# Patient Record
Sex: Female | Born: 1950 | Race: White | Hispanic: No | Marital: Married | Smoking: Never smoker
Health system: Southern US, Community
[De-identification: ages and names within clinical notes are randomized; demographics above are authoritative.]

## PROBLEM LIST (undated history)

## (undated) DIAGNOSIS — F419 Anxiety disorder, unspecified: Secondary | ICD-10-CM

## (undated) DIAGNOSIS — I10 Essential (primary) hypertension: Secondary | ICD-10-CM

## (undated) DIAGNOSIS — F03A0 Unspecified dementia, mild, without behavioral disturbance, psychotic disturbance, mood disturbance, and anxiety: Secondary | ICD-10-CM

## (undated) DIAGNOSIS — E785 Hyperlipidemia, unspecified: Secondary | ICD-10-CM

## (undated) DIAGNOSIS — L97529 Non-pressure chronic ulcer of other part of left foot with unspecified severity: Secondary | ICD-10-CM

## (undated) DIAGNOSIS — F0280 Dementia in other diseases classified elsewhere without behavioral disturbance: Secondary | ICD-10-CM

## (undated) DIAGNOSIS — L539 Erythematous condition, unspecified: Secondary | ICD-10-CM

## (undated) DIAGNOSIS — I998 Other disorder of circulatory system: Secondary | ICD-10-CM

## (undated) DIAGNOSIS — L97519 Non-pressure chronic ulcer of other part of right foot with unspecified severity: Secondary | ICD-10-CM

## (undated) DIAGNOSIS — F0390 Unspecified dementia without behavioral disturbance: Secondary | ICD-10-CM

## (undated) DIAGNOSIS — G20A1 Parkinson's disease without dyskinesia, without mention of fluctuations: Secondary | ICD-10-CM

## (undated) DIAGNOSIS — N201 Calculus of ureter: Secondary | ICD-10-CM

## (undated) DIAGNOSIS — IMO0001 Reserved for inherently not codable concepts without codable children: Secondary | ICD-10-CM

## (undated) DIAGNOSIS — L821 Other seborrheic keratosis: Secondary | ICD-10-CM

## (undated) DIAGNOSIS — Z8619 Personal history of other infectious and parasitic diseases: Secondary | ICD-10-CM

## (undated) DIAGNOSIS — G20 Parkinson's disease: Secondary | ICD-10-CM

## (undated) HISTORY — PX: BREAST EXCISIONAL BIOPSY: SUR124

## (undated) HISTORY — PX: OVARIAN CYST REMOVAL: SHX89

## (undated) HISTORY — PX: APPENDECTOMY: SHX54

## (undated) HISTORY — PX: COLONOSCOPY: SHX174

---

## 1999-06-30 ENCOUNTER — Encounter: Admission: RE | Admit: 1999-06-30 | Discharge: 1999-06-30 | Payer: Self-pay | Admitting: Family Medicine

## 1999-06-30 ENCOUNTER — Encounter: Payer: Self-pay | Admitting: Family Medicine

## 2000-11-02 ENCOUNTER — Encounter: Payer: Self-pay | Admitting: Family Medicine

## 2000-11-02 ENCOUNTER — Encounter: Admission: RE | Admit: 2000-11-02 | Discharge: 2000-11-02 | Payer: Self-pay | Admitting: Family Medicine

## 2000-11-28 ENCOUNTER — Encounter: Payer: Self-pay | Admitting: Family Medicine

## 2000-11-28 ENCOUNTER — Encounter: Admission: RE | Admit: 2000-11-28 | Discharge: 2000-11-28 | Payer: Self-pay | Admitting: Family Medicine

## 2001-03-09 ENCOUNTER — Ambulatory Visit (HOSPITAL_COMMUNITY): Admission: RE | Admit: 2001-03-09 | Discharge: 2001-03-09 | Payer: Self-pay | Admitting: Gastroenterology

## 2001-11-03 ENCOUNTER — Encounter: Admission: RE | Admit: 2001-11-03 | Discharge: 2001-11-03 | Payer: Self-pay | Admitting: Family Medicine

## 2001-11-03 ENCOUNTER — Encounter: Payer: Self-pay | Admitting: Family Medicine

## 2003-02-01 ENCOUNTER — Encounter: Admission: RE | Admit: 2003-02-01 | Discharge: 2003-02-01 | Payer: Self-pay | Admitting: Family Medicine

## 2003-02-01 ENCOUNTER — Encounter: Payer: Self-pay | Admitting: Family Medicine

## 2003-04-26 ENCOUNTER — Encounter: Admission: RE | Admit: 2003-04-26 | Discharge: 2003-04-26 | Payer: Self-pay | Admitting: Family Medicine

## 2003-04-26 ENCOUNTER — Encounter: Payer: Self-pay | Admitting: Family Medicine

## 2004-02-21 ENCOUNTER — Encounter: Admission: RE | Admit: 2004-02-21 | Discharge: 2004-02-21 | Payer: Self-pay | Admitting: Family Medicine

## 2004-04-06 ENCOUNTER — Encounter: Admission: RE | Admit: 2004-04-06 | Discharge: 2004-04-06 | Payer: Self-pay | Admitting: Family Medicine

## 2005-06-10 ENCOUNTER — Encounter: Admission: RE | Admit: 2005-06-10 | Discharge: 2005-06-10 | Payer: Self-pay | Admitting: Family Medicine

## 2006-07-14 ENCOUNTER — Encounter: Admission: RE | Admit: 2006-07-14 | Discharge: 2006-07-14 | Payer: Self-pay | Admitting: Family Medicine

## 2007-08-23 ENCOUNTER — Encounter: Admission: RE | Admit: 2007-08-23 | Discharge: 2007-08-23 | Payer: Self-pay | Admitting: Family Medicine

## 2008-08-23 ENCOUNTER — Encounter: Admission: RE | Admit: 2008-08-23 | Discharge: 2008-08-23 | Payer: Self-pay | Admitting: Family Medicine

## 2009-05-23 ENCOUNTER — Encounter: Admission: RE | Admit: 2009-05-23 | Discharge: 2009-05-23 | Payer: Self-pay | Admitting: Family Medicine

## 2009-08-25 ENCOUNTER — Encounter: Admission: RE | Admit: 2009-08-25 | Discharge: 2009-08-25 | Payer: Self-pay | Admitting: Family Medicine

## 2010-08-01 ENCOUNTER — Encounter: Payer: Self-pay | Admitting: Family Medicine

## 2010-08-20 ENCOUNTER — Other Ambulatory Visit: Payer: Self-pay | Admitting: Family Medicine

## 2010-08-20 DIAGNOSIS — Z1231 Encounter for screening mammogram for malignant neoplasm of breast: Secondary | ICD-10-CM

## 2010-08-27 ENCOUNTER — Ambulatory Visit
Admission: RE | Admit: 2010-08-27 | Discharge: 2010-08-27 | Disposition: A | Discharge status: Home / Self Care | Payer: 59 | Source: Ambulatory Visit | Attending: Family Medicine | Admitting: Family Medicine

## 2010-08-27 DIAGNOSIS — Z1231 Encounter for screening mammogram for malignant neoplasm of breast: Secondary | ICD-10-CM

## 2010-11-27 NOTE — Procedures (Signed)
[place]  Patient:    Becky Rogers, Becky Rogers Visit Number: [id number] MRN: [id number]          Service Type: END Location: ENDO Attending Physician:  Charna Elizabeth Proc. Date: 03/09/01 Admit Date:  03/09/2001   CC:         Teena Irani. Arlyce Dice, M.D.                           Procedure Report  DATE OF BIRTH:  04-19-1951  REFERRING PHYSICIAN:   Teena Irani. Arlyce Dice, M.D.  PROCEDURE PERFORMED:  Colonoscopy.  ENDOSCOPIST:  Anselmo Rod, M.D.  INSTRUMENT USED:  Olympus video colonoscope.  INDICATIONS FOR PROCEDURE:  Rectal bleeding in a 60 year old white female rule out colonic polyps, masses, hemorrhoids etc.  PREPROCEDURE PREPARATION:  Informed consent was procured from the patient. The patient was fasted for eight hours prior to the procedure and prepped with a bottle of magnesium citrate and a gallon of NuLytely the night prior to the procedure.  PREPROCEDURE PHYSICAL:  The patient had stable vital signs.  Neck supple. Chest clear to auscultation.  S1, S2 regular.  Abdomen soft with normal abdominal bowel sounds.  DESCRIPTION OF PROCEDURE:  The patient was placed in the left lateral decubitus position and sedated with 50 mg of Demerol and 5 mg of Versed intravenously.  Once the patient was adequately sedated and maintained on low-flow oxygen and continuous cardiac monitoring, the Olympus video colonoscope was advanced from the rectum to the cecum without difficulty. Except for small internal hemorrhoids, no other abnormalities were noted.  No masses, polyps, erosions, ulcers, or diverticula were present.  IMPRESSION:  Healthy-appearing colon except for small nonbleeding internal hemorrhoids.  RECOMMENDATIONS:  A high fiber diet has been recommended for the patient and outpatient follow-up is advised in the next four weeks.  Further recommendations will be made at that time.Attending Physician:  Charna Elizabeth DD:  03/09/01 TD:   03/09/01 Job: [id number]

## 2011-07-13 HISTORY — PX: OTHER SURGICAL HISTORY: SHX169

## 2011-08-12 ENCOUNTER — Other Ambulatory Visit: Payer: Self-pay | Admitting: Family Medicine

## 2011-08-12 DIAGNOSIS — Z1231 Encounter for screening mammogram for malignant neoplasm of breast: Secondary | ICD-10-CM

## 2011-08-30 ENCOUNTER — Ambulatory Visit
Admission: RE | Admit: 2011-08-30 | Discharge: 2011-08-30 | Disposition: A | Discharge status: Home / Self Care | Payer: BC Managed Care – PPO | Source: Ambulatory Visit | Attending: Family Medicine | Admitting: Family Medicine

## 2011-08-30 DIAGNOSIS — Z1231 Encounter for screening mammogram for malignant neoplasm of breast: Secondary | ICD-10-CM

## 2012-01-06 ENCOUNTER — Encounter (HOSPITAL_COMMUNITY): Payer: Self-pay | Admitting: Emergency Medicine

## 2012-01-06 ENCOUNTER — Emergency Department (HOSPITAL_COMMUNITY): Payer: BC Managed Care – PPO

## 2012-01-06 ENCOUNTER — Emergency Department (HOSPITAL_COMMUNITY)
Admission: EM | Admit: 2012-01-06 | Discharge: 2012-01-07 | Disposition: A | Discharge status: Home / Self Care | Payer: BC Managed Care – PPO | Attending: Emergency Medicine | Admitting: Emergency Medicine

## 2012-01-06 DIAGNOSIS — E7800 Pure hypercholesterolemia, unspecified: Secondary | ICD-10-CM | POA: Insufficient documentation

## 2012-01-06 DIAGNOSIS — S61219A Laceration without foreign body of unspecified finger without damage to nail, initial encounter: Secondary | ICD-10-CM

## 2012-01-06 DIAGNOSIS — M79609 Pain in unspecified limb: Secondary | ICD-10-CM | POA: Insufficient documentation

## 2012-01-06 DIAGNOSIS — W268XXA Contact with other sharp object(s), not elsewhere classified, initial encounter: Secondary | ICD-10-CM | POA: Insufficient documentation

## 2012-01-06 DIAGNOSIS — I10 Essential (primary) hypertension: Secondary | ICD-10-CM | POA: Insufficient documentation

## 2012-01-06 DIAGNOSIS — S61209A Unspecified open wound of unspecified finger without damage to nail, initial encounter: Secondary | ICD-10-CM | POA: Insufficient documentation

## 2012-01-06 DIAGNOSIS — Z79899 Other long term (current) drug therapy: Secondary | ICD-10-CM | POA: Insufficient documentation

## 2012-01-06 HISTORY — DX: Essential (primary) hypertension: I10

## 2012-01-06 NOTE — ED Notes (Signed)
PA at bedside for suturting

## 2012-01-06 NOTE — ED Notes (Addendum)
Pt reports trying to walk dog, and leash caught her and pulled R index finger into gate latch, pt has horseshoe like lac to finger; CMS intact; bleeding controlled at time- dressing applied PTA; last tetanus shot approx 2 years ago

## 2012-01-06 NOTE — ED Provider Notes (Signed)
History     CSN: [id number]  Arrival date & time 01/06/12  2025   First MD Initiated Contact with Patient 01/06/12 2112      Chief Complaint  Patient presents with  . Laceration    (Consider location/radiation/quality/duration/timing/severity/associated sxs/prior treatment) HPI Comments: Patient here with right index finger laceration after walking the dog and the dog trying to pull away from her and she struck her right index finger on the latch of the gate - wound is hemostatic - good flexion and extension - tetanus UTD  Patient is a 61 y.o. female presenting with skin laceration. The history is provided by the patient. No language interpreter was used.  Laceration  The incident occurred 1 to 2 hours ago. The laceration is located on the right hand. The laceration is 2 cm in size. The laceration mechanism was a a metal edge. The pain is at a severity of 4/10. The pain is mild. The pain has been constant since onset. She reports no foreign bodies present. Her tetanus status is UTD.    Past Medical History  Diagnosis Date  . Hypertension   . Hypercholesteremia     Past Surgical History  Procedure Date  . Cesarean section   . Ovarian cyst removal     History reviewed. No pertinent family history.  History  Substance Use Topics  . Smoking status: Never Smoker   . Smokeless tobacco: Not on file  . Alcohol Use: Yes     occasion    OB History    Grav Para Term Preterm Abortions TAB SAB Ect Mult Living                  Review of Systems  Constitutional: Negative for fever and chills.  HENT: Negative for neck pain.   Eyes: Negative for pain.  Respiratory: Negative for chest tightness and shortness of breath.   Cardiovascular: Negative for chest pain.  Gastrointestinal: Negative for nausea, vomiting and abdominal pain.  Genitourinary: Negative for dysuria.  Musculoskeletal: Negative for back pain.  Skin: Positive for wound.  Neurological: Negative for headaches.    All other systems reviewed and are negative.    Allergies  Review of patient's allergies indicates no known allergies.  Home Medications   Current Outpatient Rx  Name Route Sig Dispense Refill  . AMLODIPINE BESYLATE 5 MG PO TABS Oral Take 5 mg by mouth daily.    . ATORVASTATIN CALCIUM 40 MG PO TABS Oral Take 40 mg by mouth daily.    Marland Kitchen CALCIUM-VITAMIN D PO Oral Take 2 tablets by mouth daily.    . OMEGA-3 FATTY ACIDS 1000 MG PO CAPS Oral Take 3 g by mouth daily.    Carma Leaven M PLUS PO TABS Oral Take 1 tablet by mouth daily.      BP 148/73  Pulse 69  Temp 98.1 F (36.7 C) (Oral)  Resp 19  SpO2 100%  Physical Exam  Nursing note and vitals reviewed. Constitutional: She is oriented to person, place, and time. She appears well-developed and well-nourished. No distress.  HENT:  Head: Normocephalic and atraumatic.  Right Ear: External ear normal.  Left Ear: External ear normal.  Nose: Nose normal.  Mouth/Throat: Oropharynx is clear and moist. No oropharyngeal exudate.  Eyes: Conjunctivae are normal. Pupils are equal, round, and reactive to light. No scleral icterus.  Neck: Normal range of motion. Neck supple.  Cardiovascular: Normal rate, regular rhythm and normal heart sounds.  Exam reveals no gallop and no friction  rub.   No murmur heard. Pulmonary/Chest: Effort normal and breath sounds normal. No respiratory distress. She has no wheezes. She has no rales. She exhibits no tenderness.  Abdominal: Soft. Bowel sounds are normal. She exhibits no distension. There is no tenderness.  Musculoskeletal:       Right hand: She exhibits tenderness and laceration. She exhibits normal range of motion, no bony tenderness, normal two-point discrimination, normal capillary refill and no swelling. normal sensation noted. Normal strength noted. She exhibits no finger abduction, no thumb/finger opposition and no wrist extension trouble.       Hands: Lymphadenopathy:    She has no cervical  adenopathy.  Neurological: She is alert and oriented to person, place, and time. No cranial nerve deficit. She exhibits normal muscle tone. Coordination normal.  Skin: Skin is warm and dry. No rash noted. No erythema. No pallor.  Psychiatric: She has a normal mood and affect. Her behavior is normal. Judgment and thought content normal.    ED Course  Procedures (including critical care time)  Labs Reviewed - No data to display No results found. No results found for this or any previous visit. Dg Finger Index Right  01/06/2012  *RADIOLOGY REPORT*  Clinical Data: 61 year old female with laceration to index finger.  RIGHT INDEX FINGER 2+V  Comparison: None.  Findings: Distal joint space loss and degenerative spurring. Bone mineralization is within normal limits for age.  Other joint spaces are preserved.  No acute fracture or dislocation. No radiopaque foreign body identified.  IMPRESSION: No acute osseous abnormality identified about the right index finger.  Osteoarthritis.  Original Report Authenticated By: Harley Hallmark, M.D.    LACERATION REPAIR Performed by: Patrecia Pour. Authorized by: Patrecia Pour Consent: Verbal consent obtained. Risks and benefits: risks, benefits and alternatives were discussed Consent given by: patient Patient identity confirmed: provided demographic data Prepped and Draped in normal sterile fashion Wound explored  Laceration Location: right index finger  Laceration Length: 2cm  No Foreign Bodies seen or palpated  Anesthesia: digital block  Local anesthetic: lidocaine 2% without epinephrine  Anesthetic total: 6 ml  Irrigation method: syringe Amount of cleaning: standard  Skin closure: 4.0 nylon  Number of sutures: 9  Technique: simple interrupted  Patient tolerance: Patient tolerated the procedure well with no immediate complications.  Able to flex and extend after suturing - finger splint placed  Right index finger  laceration    MDM  Paitent here with uncomplicated right index finger laceration - no foreign body noted - placed in splint and will return in 1 week for suture removal.        Scarlette Calico C. [place], [place] 01/06/12 2337

## 2012-01-06 NOTE — Discharge Instructions (Signed)
Finger Avulsion  When the tip of the finger is lost, a new nail may grow back if part of the fingernail is left. The new nail may be deformed. If just the tip of the finger is lost, no repair may be needed unless there is bone showing. If bone is showing, your caregiver may need to remove the protruding bone and put on a bandage. Your caregiver will do what is best for you. Most of the time when a fingertip is lost, the end will gradually grow back on and look fairly normal, but it may remain sensitive to pressure and temperature extremes for a long time. HOME CARE INSTRUCTIONS   Keep your hand elevated above your heart to relieve pain and swelling.   Keep your dressing dry and clean.   Change your bandage in 24 hours or as directed.   After your bandage is changed, soak your hand in warm soapy water for 10 to 15 minutes. Do this 3 times per day. This helps reduce pain and swelling.   After soaking your hand, apply a clean, dry bandage. Change your bandage if it is wet or dirty.   Only take over-the-counter or prescription medicines for pain, discomfort, or fever as directed by your caregiver.   See your caregiver as needed for problems.  SEEK MEDICAL CARE IF:   You have increased pain, swelling, drainage, or bleeding.   You have a fever.   You have swelling that spreads from your finger and into your hand.  Make sure to check to see if you need a tetanus booster. Document Released: 09/06/2001 Document Revised: 06/17/2011 Document Reviewed: 08/01/2008 [facility] Patient Information 2012 [place], [place].Laceration Care, Adult A laceration is a cut or lesion that goes through all layers of the skin and into the tissue just beneath the skin. TREATMENT  Some lacerations may not require closure. Some lacerations may not be able to be closed due to an increased risk of infection. It is important to see your caregiver as soon as possible after an injury to minimize the risk of infection and  maximize the opportunity for successful closure. If closure is appropriate, pain medicines may be given, if needed. The wound will be cleaned to help prevent infection. Your caregiver will use stitches (sutures), staples, wound glue (adhesive), or skin adhesive strips to repair the laceration. These tools bring the skin edges together to allow for faster healing and a better cosmetic outcome. However, all wounds will heal with a scar. Once the wound has healed, scarring can be minimized by covering the wound with sunscreen during the day for 1 full year. HOME CARE INSTRUCTIONS  For sutures or staples:  Keep the wound clean and dry.   If you were given a bandage (dressing), you should change it at least once a day. Also, change the dressing if it becomes wet or dirty, or as directed by your caregiver.   Wash the wound with soap and water 2 times a day. Rinse the wound off with water to remove all soap. Pat the wound dry with a clean towel.   After cleaning, apply a thin layer of the antibiotic ointment as recommended by your caregiver. This will help prevent infection and keep the dressing from sticking.   You may shower as usual after the first 24 hours. Do not soak the wound in water until the sutures are removed.   Only take over-the-counter or prescription medicines for pain, discomfort, or fever as directed by your caregiver.  Get your sutures or staples removed as directed by your caregiver.  For skin adhesive strips:  Keep the wound clean and dry.   Do not get the skin adhesive strips wet. You may bathe carefully, using caution to keep the wound dry.   If the wound gets wet, pat it dry with a clean towel.   Skin adhesive strips will fall off on their own. You may trim the strips as the wound heals. Do not remove skin adhesive strips that are still stuck to the wound. They will fall off in time.  For wound adhesive:  You may briefly wet your wound in the shower or bath. Do not soak  or scrub the wound. Do not swim. Avoid periods of heavy perspiration until the skin adhesive has fallen off on its own. After showering or bathing, gently pat the wound dry with a clean towel.   Do not apply liquid medicine, cream medicine, or ointment medicine to your wound while the skin adhesive is in place. This may loosen the film before your wound is healed.   If a dressing is placed over the wound, be careful not to apply tape directly over the skin adhesive. This may cause the adhesive to be pulled off before the wound is healed.   Avoid prolonged exposure to sunlight or tanning lamps while the skin adhesive is in place. Exposure to ultraviolet light in the first year will darken the scar.   The skin adhesive will usually remain in place for 5 to 10 days, then naturally fall off the skin. Do not pick at the adhesive film.  You may need a tetanus shot if:  You cannot remember when you had your last tetanus shot.   You have never had a tetanus shot.  If you get a tetanus shot, your arm may swell, get red, and feel warm to the touch. This is common and not a problem. If you need a tetanus shot and you choose not to have one, there is a rare chance of getting tetanus. Sickness from tetanus can be serious. SEEK MEDICAL CARE IF:   You have redness, swelling, or increasing pain in the wound.   You see a red line that goes away from the wound.   You have yellowish-white fluid (pus) coming from the wound.   You have a fever.   You notice a bad smell coming from the wound or dressing.   Your wound breaks open before or after sutures have been removed.   You notice something coming out of the wound such as wood or glass.   Your wound is on your hand or foot and you cannot move a finger or toe.  SEEK IMMEDIATE MEDICAL CARE IF:   Your pain is not controlled with prescribed medicine.   You have severe swelling around the wound causing pain and numbness or a change in color in your arm,  hand, leg, or foot.   Your wound splits open and starts bleeding.   You have worsening numbness, weakness, or loss of function of any joint around or beyond the wound.   You develop painful lumps near the wound or on the skin anywhere on your body.  MAKE SURE YOU:   Understand these instructions.   Will watch your condition.   Will get help right away if you are not doing well or get worse.  Document Released: 06/28/2005 Document Revised: 06/17/2011 Document Reviewed: 12/22/2010 [facility] Patient Information 2012 [place], [place].

## 2012-01-07 NOTE — ED Provider Notes (Signed)
Medical screening examination/treatment/procedure(s) were performed by non-physician practitioner and as supervising physician I was immediately available for consultation/collaboration.   Loren Racer, MD 01/07/12 [phone]

## 2012-09-26 ENCOUNTER — Other Ambulatory Visit: Payer: Self-pay

## 2012-09-26 DIAGNOSIS — Z1231 Encounter for screening mammogram for malignant neoplasm of breast: Secondary | ICD-10-CM

## 2012-10-27 ENCOUNTER — Ambulatory Visit: Payer: BC Managed Care – PPO

## 2012-11-01 ENCOUNTER — Ambulatory Visit
Admission: RE | Admit: 2012-11-01 | Discharge: 2012-11-01 | Disposition: A | Discharge status: Home / Self Care | Payer: 59 | Source: Ambulatory Visit

## 2012-11-01 DIAGNOSIS — Z1231 Encounter for screening mammogram for malignant neoplasm of breast: Secondary | ICD-10-CM

## 2014-05-23 DIAGNOSIS — R259 Unspecified abnormal involuntary movements: Secondary | ICD-10-CM | POA: Insufficient documentation

## 2014-05-23 DIAGNOSIS — G3184 Mild cognitive impairment, so stated: Secondary | ICD-10-CM | POA: Insufficient documentation

## 2014-05-23 DIAGNOSIS — G4752 REM sleep behavior disorder: Secondary | ICD-10-CM | POA: Insufficient documentation

## 2014-07-01 ENCOUNTER — Other Ambulatory Visit: Payer: Self-pay | Admitting: Family Medicine

## 2014-07-01 DIAGNOSIS — Z1231 Encounter for screening mammogram for malignant neoplasm of breast: Secondary | ICD-10-CM

## 2014-07-15 ENCOUNTER — Ambulatory Visit
Admission: RE | Admit: 2014-07-15 | Discharge: 2014-07-15 | Disposition: A | Discharge status: Home / Self Care | Payer: BLUE CROSS/BLUE SHIELD | Source: Ambulatory Visit | Attending: Family Medicine | Admitting: Family Medicine

## 2014-07-15 ENCOUNTER — Encounter (INDEPENDENT_AMBULATORY_CARE_PROVIDER_SITE_OTHER): Payer: Self-pay

## 2014-07-15 DIAGNOSIS — Z1231 Encounter for screening mammogram for malignant neoplasm of breast: Secondary | ICD-10-CM

## 2014-07-17 ENCOUNTER — Other Ambulatory Visit: Payer: Self-pay | Admitting: Family Medicine

## 2014-07-17 DIAGNOSIS — R928 Other abnormal and inconclusive findings on diagnostic imaging of breast: Secondary | ICD-10-CM

## 2014-07-26 ENCOUNTER — Other Ambulatory Visit: Payer: Self-pay | Admitting: Family Medicine

## 2014-07-26 ENCOUNTER — Ambulatory Visit
Admission: RE | Admit: 2014-07-26 | Discharge: 2014-07-26 | Disposition: A | Discharge status: Home / Self Care | Payer: BLUE CROSS/BLUE SHIELD | Source: Ambulatory Visit | Attending: Family Medicine | Admitting: Family Medicine

## 2014-07-26 DIAGNOSIS — R921 Mammographic calcification found on diagnostic imaging of breast: Secondary | ICD-10-CM

## 2014-07-26 DIAGNOSIS — R928 Other abnormal and inconclusive findings on diagnostic imaging of breast: Secondary | ICD-10-CM

## 2014-08-01 ENCOUNTER — Other Ambulatory Visit: Payer: Self-pay | Admitting: Family Medicine

## 2014-08-01 DIAGNOSIS — R921 Mammographic calcification found on diagnostic imaging of breast: Secondary | ICD-10-CM

## 2014-08-07 ENCOUNTER — Ambulatory Visit
Admission: RE | Admit: 2014-08-07 | Discharge: 2014-08-07 | Disposition: A | Discharge status: Home / Self Care | Payer: BLUE CROSS/BLUE SHIELD | Source: Ambulatory Visit | Attending: Family Medicine | Admitting: Family Medicine

## 2014-08-07 DIAGNOSIS — R921 Mammographic calcification found on diagnostic imaging of breast: Secondary | ICD-10-CM

## 2014-08-20 ENCOUNTER — Other Ambulatory Visit (INDEPENDENT_AMBULATORY_CARE_PROVIDER_SITE_OTHER): Payer: Self-pay | Admitting: General Surgery

## 2014-08-20 DIAGNOSIS — R928 Other abnormal and inconclusive findings on diagnostic imaging of breast: Secondary | ICD-10-CM

## 2014-08-22 ENCOUNTER — Encounter (HOSPITAL_BASED_OUTPATIENT_CLINIC_OR_DEPARTMENT_OTHER): Payer: Self-pay | Admitting: *Deleted

## 2014-08-22 ENCOUNTER — Other Ambulatory Visit (INDEPENDENT_AMBULATORY_CARE_PROVIDER_SITE_OTHER): Payer: Self-pay | Admitting: General Surgery

## 2014-08-22 DIAGNOSIS — R928 Other abnormal and inconclusive findings on diagnostic imaging of breast: Secondary | ICD-10-CM

## 2014-08-22 NOTE — Progress Notes (Signed)
To come for ekg-bmet after seeds 2/15

## 2014-08-25 NOTE — H&P (Signed)
Becky Rogers  Location: [facility] Patient #: [id number] DOB: 05/30/1951 Married / Language: English / Race: White Female       History of Present Illness    The patient is a 64 year old female who presents with a breast mass. This is a 64 year old Caucasian female, referred by Dr. Marin Olp at the [facility] for complex sclerosing lesion right breast, lower inner quadrant with suspicious microcalcifications. Dr. Bing Matter, PA and Dr. Kathryne Eriksson serve for primary care. Dr. Mosetta Anis is her neurologist. The patient has not had a breast problem in the past. Last mammogram was 18 months ago. Recent screening mammograms and subsequent right breast mammogram and ultrasound showed an area of calcifications in the right breast, lower inner quadrant, 2.5 cm x 0.8 cm x 0.4 cm. Some of these calcifications are linear. Image guided biopsy shows complex sclerosing lesion, usual ductal hyperplasia. Family history is negative for breast or ovarian cancer. Comorbidities include hyperlipidemia, hypertension, very early parkinsonism, with tremor well-controlled on carbidopa. Borderline dementia. Completely functional otherwise. History 2 C-sections. History appendectomy and ovarian cystectomy. She is married . 2 children. Denies tobacco. Husband works in L[employer].    Other Problems  Anxiety Disorder Hemorrhoids High blood pressure Hypercholesterolemia Lump In Breast Melanoma  Past Surgical History  Appendectomy Breast Biopsy Right. Cesarean Section - Multiple Oral Surgery  Diagnostic Studies History  Colonoscopy 1-5 years ago Mammogram within last year Pap Smear 1-5 years ago  Medication History  Carbidopa-Levodopa (25-100MG  Tablet, Oral) Active. Atorvastatin Calcium (40MG  Tablet, Oral) Active. AmLODIPine Besylate  (5MG  Tablet, Oral) Active. ClonazePAM (0.5MG  Tablet, Oral) Active.  Social History  Alcohol use Occasional alcohol use. Caffeine use Carbonated beverages, Coffee, Tea. No drug use Tobacco use Never smoker.  Family History  Diabetes Mellitus Family Members In General. Heart Disease Family Members In General, Mother. Heart disease in female family member before age 73 Hypertension Family Members In General, Mother.  Pregnancy / Birth History  Age at menarche 46 years. Age of menopause 51-55 Contraceptive History Oral contraceptives. Gravida 2 Maternal age 70-30 Para 2  Review of Systems General Present- Appetite Loss, Fatigue and Weight Loss. Not Present- Chills, Fever, Night Sweats and Weight Gain. Skin Not Present- Change in Wart/Mole, Dryness, Hives, Jaundice, New Lesions, Non-Healing Wounds, Rash and Ulcer. HEENT Not Present- Earache, Hearing Loss, Hoarseness, Nose Bleed, Oral Ulcers, Ringing in the Ears, Seasonal Allergies, Sinus Pain, Sore Throat, Visual Disturbances, Wears glasses/contact lenses and Yellow Eyes. Respiratory Present- Snoring. Not Present- Bloody sputum, Chronic Cough, Difficulty Breathing and Wheezing. Breast Present- Breast Mass. Not Present- Breast Pain, Nipple Discharge and Skin Changes. Cardiovascular Not Present- Chest Pain, Difficulty Breathing Lying Down, Leg Cramps, Palpitations, Rapid Heart Rate, Shortness of Breath and Swelling of Extremities. Gastrointestinal Present- Constipation, Gets full quickly at meals and Hemorrhoids. Not Present- Abdominal Pain, Bloating, Bloody Stool, Change in Bowel Habits, Chronic diarrhea, Difficulty Swallowing, Excessive gas, Indigestion, Nausea, Rectal Pain and Vomiting. Female Genitourinary Not Present- Frequency, Nocturia, Painful Urination, Pelvic Pain and Urgency. Musculoskeletal Not Present- Back Pain, Joint Pain, Joint Stiffness, Muscle Pain, Muscle Weakness and Swelling of Extremities. Neurological  Present- Decreased Memory and Tremor. Not Present- Fainting, Headaches, Numbness, Seizures, Tingling, Trouble walking and Weakness. Psychiatric Present- Anxiety and Change in Sleep Pattern. Not Present- Bipolar, Depression, Fearful and Frequent crying. Endocrine Not Present- Cold Intolerance, Excessive Hunger, Hair Changes, Heat Intolerance, Hot flashes and New Diabetes.   Vitals 08/20/2014 11:08 AM Weight: 114 lb Height: 61in Body Surface Area:  1.49 m Body Mass Index: 21.54 kg/m Temp.: 98.40F(Temporal)  Pulse: 72 (Regular)  BP: 116/68 (Sitting, Left Arm, Standard)    Physical Exam  General Mental Status-Alert. General Appearance-Consistent with stated age. Hydration-Well hydrated. Voice-Normal. Note: Alert. Oriented. Good comprehension and insight. Takes lots of notes. Competent to give informed consent.   Head and Neck Head-normocephalic, atraumatic with no lesions or palpable masses. Trachea-midline. Thyroid Gland Characteristics - normal size and consistency.  Eye Eyeball - Bilateral-Extraocular movements intact. Sclera/Conjunctiva - Bilateral-No scleral icterus.  Chest and Lung Exam Chest and lung exam reveals -quiet, even and easy respiratory effort with no use of accessory muscles and on auscultation, normal breath sounds, no adventitious sounds and normal vocal resonance. Inspection Chest Wall - Normal. Back - normal.  Breast Breast - Left-Symmetric, Non Tender, No Biopsy scars, no Dimpling, No Inflammation, No Lumpectomy scars, No Mastectomy scars, No Peau d' Orange. Breast - Right-Non Tender, No Biopsy scars, no Dimpling, No Inflammation, No Lumpectomy scars, No Mastectomy scars, No Peau d' Orange. Breast Lump-No Palpable Breast Mass. Note: Tiny needle biopsy scar right breast medially. No hematoma. No palpable mass in either breast. No change of skin of breast or nipple. No axillary  adenopathy.   Cardiovascular Cardiovascular examination reveals -normal heart sounds, regular rate and rhythm with no murmurs and normal pedal pulses bilaterally.  Abdomen Inspection Inspection of the abdomen reveals - No Hernias. Skin - Scar - no surgical scars. Palpation/Percussion Palpation and Percussion of the abdomen reveal - Soft, Non Tender, No Rebound tenderness, No Rigidity (guarding) and No hepatosplenomegaly. Auscultation Auscultation of the abdomen reveals - Bowel sounds normal.  Neurologic Neurologic evaluation reveals -alert and oriented x 3 with no impairment of recent or remote memory. Mental Status-Normal.  Musculoskeletal Normal Exam - Left-Upper Extremity Strength Normal and Lower Extremity Strength Normal. Normal Exam - Right-Upper Extremity Strength Normal and Lower Extremity Strength Normal.  Lymphatic Head & Neck  General Head & Neck Lymphatics: Bilateral - Description - Normal. Axillary  General Axillary Region: Bilateral - Description - Normal. Tenderness - Non Tender. Femoral & Inguinal  Generalized Femoral & Inguinal Lymphatics: Bilateral - Description - Normal. Tenderness - Non Tender.    Assessment & Plan  ABNORMAL MAMMOGRAM WITH MICROCALCIFICATION (793.81  R92.0) Impression: Microcalcifications right breast, lower inner quadrant, 2.5 cm x 0.8 cm x 0.4 cm. Complex sclerosing lesion on image guided biopsy  Current Plans: Schedule for Surgery  You have been found to have suspicious calcifications in the lower inner quadrant of your right breast. The image guided biopsy shows complex sclerosing lesion. No cancer was seen. This area should be conservatively excised to make sure we have not missed an early, in situ cancer He will be scheduled for right lumpectomy with radioactive seed localization in the near future We have discussed the techniques and risks of this surgery in detail.   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 Dalbert Batman, M.D., [facility] General and Minimally invasive Surgery Breast and Colorectal Surgery Office:   [phone] Pager:   [phone]

## 2014-08-26 ENCOUNTER — Inpatient Hospital Stay: Admission: RE | Admit: 2014-08-26 | Payer: BLUE CROSS/BLUE SHIELD | Source: Ambulatory Visit

## 2014-08-26 NOTE — Progress Notes (Signed)
Pt to have seeds 8am then come here-will need her labs then

## 2014-08-27 ENCOUNTER — Encounter (HOSPITAL_BASED_OUTPATIENT_CLINIC_OR_DEPARTMENT_OTHER)
Admission: RE | Admit: 2014-08-27 | Discharge: 2014-08-27 | Disposition: A | Discharge status: Home / Self Care | Payer: BLUE CROSS/BLUE SHIELD | Source: Ambulatory Visit | Attending: General Surgery | Admitting: General Surgery

## 2014-08-27 DIAGNOSIS — Z79899 Other long term (current) drug therapy: Secondary | ICD-10-CM | POA: Diagnosis not present

## 2014-08-27 DIAGNOSIS — E785 Hyperlipidemia, unspecified: Secondary | ICD-10-CM | POA: Diagnosis not present

## 2014-08-27 DIAGNOSIS — N6021 Fibroadenosis of right breast: Secondary | ICD-10-CM | POA: Diagnosis not present

## 2014-08-27 DIAGNOSIS — E780 Pure hypercholesterolemia: Secondary | ICD-10-CM | POA: Diagnosis not present

## 2014-08-27 DIAGNOSIS — N6091 Unspecified benign mammary dysplasia of right breast: Secondary | ICD-10-CM | POA: Diagnosis not present

## 2014-08-27 DIAGNOSIS — I10 Essential (primary) hypertension: Secondary | ICD-10-CM | POA: Diagnosis not present

## 2014-08-27 DIAGNOSIS — R920 Mammographic microcalcification found on diagnostic imaging of breast: Secondary | ICD-10-CM | POA: Diagnosis not present

## 2014-08-27 DIAGNOSIS — G20 Parkinson's disease: Secondary | ICD-10-CM | POA: Diagnosis not present

## 2014-08-27 DIAGNOSIS — Z85828 Personal history of other malignant neoplasm of skin: Secondary | ICD-10-CM | POA: Diagnosis not present

## 2014-08-27 LAB — BASIC METABOLIC PANEL
Anion gap: 10 (ref 5–15)
BUN: 10 mg/dL (ref 6–23)
CALCIUM: 9.1 mg/dL (ref 8.4–10.5)
CHLORIDE: 104 mmol/L (ref 96–112)
CO2: 26 mmol/L (ref 19–32)
Creatinine, Ser: 0.67 mg/dL (ref 0.50–1.10)
GFR calc Af Amer: >90 mL/min (ref >90)
GFR calc non Af Amer: >90 mL/min (ref >90)
GLUCOSE: 88 mg/dL (ref 70–99)
Potassium: 4.2 mmol/L (ref 3.5–5.1)
Sodium: 140 mmol/L (ref 135–145)

## 2014-08-28 ENCOUNTER — Ambulatory Visit
Admission: RE | Admit: 2014-08-28 | Discharge: 2014-08-28 | Disposition: A | Discharge status: Home / Self Care | Payer: BLUE CROSS/BLUE SHIELD | Source: Ambulatory Visit | Attending: General Surgery | Admitting: General Surgery

## 2014-08-28 ENCOUNTER — Ambulatory Visit (HOSPITAL_BASED_OUTPATIENT_CLINIC_OR_DEPARTMENT_OTHER)
Admission: RE | Admit: 2014-08-28 | Discharge: 2014-08-28 | Disposition: A | Discharge status: Home / Self Care | Payer: BLUE CROSS/BLUE SHIELD | Source: Ambulatory Visit | Attending: General Surgery | Admitting: General Surgery

## 2014-08-28 ENCOUNTER — Encounter (HOSPITAL_BASED_OUTPATIENT_CLINIC_OR_DEPARTMENT_OTHER): Payer: Self-pay | Admitting: *Deleted

## 2014-08-28 ENCOUNTER — Encounter (HOSPITAL_BASED_OUTPATIENT_CLINIC_OR_DEPARTMENT_OTHER)
Admission: RE | Disposition: A | Discharge status: Home / Self Care | Payer: Self-pay | Source: Ambulatory Visit | Attending: General Surgery

## 2014-08-28 ENCOUNTER — Ambulatory Visit (HOSPITAL_BASED_OUTPATIENT_CLINIC_OR_DEPARTMENT_OTHER): Payer: BLUE CROSS/BLUE SHIELD | Admitting: Anesthesiology

## 2014-08-28 DIAGNOSIS — E780 Pure hypercholesterolemia: Secondary | ICD-10-CM | POA: Insufficient documentation

## 2014-08-28 DIAGNOSIS — G20 Parkinson's disease: Secondary | ICD-10-CM | POA: Insufficient documentation

## 2014-08-28 DIAGNOSIS — N6021 Fibroadenosis of right breast: Secondary | ICD-10-CM | POA: Diagnosis not present

## 2014-08-28 DIAGNOSIS — Z85828 Personal history of other malignant neoplasm of skin: Secondary | ICD-10-CM | POA: Insufficient documentation

## 2014-08-28 DIAGNOSIS — R928 Other abnormal and inconclusive findings on diagnostic imaging of breast: Secondary | ICD-10-CM

## 2014-08-28 DIAGNOSIS — R920 Mammographic microcalcification found on diagnostic imaging of breast: Secondary | ICD-10-CM | POA: Insufficient documentation

## 2014-08-28 DIAGNOSIS — I10 Essential (primary) hypertension: Secondary | ICD-10-CM | POA: Insufficient documentation

## 2014-08-28 DIAGNOSIS — N6091 Unspecified benign mammary dysplasia of right breast: Secondary | ICD-10-CM | POA: Insufficient documentation

## 2014-08-28 DIAGNOSIS — Z79899 Other long term (current) drug therapy: Secondary | ICD-10-CM | POA: Insufficient documentation

## 2014-08-28 DIAGNOSIS — E785 Hyperlipidemia, unspecified: Secondary | ICD-10-CM | POA: Insufficient documentation

## 2014-08-28 HISTORY — PX: BREAST LUMPECTOMY WITH RADIOACTIVE SEED LOCALIZATION: SHX6424

## 2014-08-28 HISTORY — DX: Parkinson's disease without dyskinesia, without mention of fluctuations: G20.A1

## 2014-08-28 HISTORY — DX: Parkinson's disease: G20

## 2014-08-28 LAB — POCT HEMOGLOBIN-HEMACUE: Hemoglobin: 15.9 g/dL — ABNORMAL HIGH (ref 12.0–15.0)

## 2014-08-28 SURGERY — BREAST LUMPECTOMY WITH RADIOACTIVE SEED LOCALIZATION
Anesthesia: General | Laterality: Right

## 2014-08-28 MED ORDER — HYDROCODONE-ACETAMINOPHEN 5-325 MG PO TABS: 1.0000 | ORAL_TABLET | Freq: Four times a day (QID) | ORAL | Status: DC | PRN

## 2014-08-28 MED ORDER — PHENYLEPHRINE HCL 10 MG/ML IJ SOLN
INTRAMUSCULAR | Status: DC | PRN
  Administered 2014-08-28: 40 ug via INTRAVENOUS

## 2014-08-28 MED ORDER — BUPIVACAINE-EPINEPHRINE (PF) 0.5% -1:200000 IJ SOLN
INTRAMUSCULAR | Status: DC | PRN
  Administered 2014-08-28: 8 mL via PERINEURAL

## 2014-08-28 MED ORDER — 0.9 % SODIUM CHLORIDE (POUR BTL) OPTIME
TOPICAL | Status: DC | PRN
  Administered 2014-08-28: 1000 mL

## 2014-08-28 MED ORDER — FENTANYL CITRATE 0.05 MG/ML IJ SOLN: 50.0000 ug | INTRAMUSCULAR | Status: DC | PRN

## 2014-08-28 MED ORDER — HYDROMORPHONE HCL 1 MG/ML IJ SOLN: 0.2500 mg | INTRAMUSCULAR | Status: DC | PRN

## 2014-08-28 MED ORDER — PROMETHAZINE HCL 25 MG/ML IJ SOLN
6.2500 mg | INTRAMUSCULAR | Status: DC | PRN
Start: 2014-08-28 — End: 2014-08-28

## 2014-08-28 MED ORDER — CHLORHEXIDINE GLUCONATE 4 % EX LIQD: 1.0000 "application " | Freq: Once | CUTANEOUS | Status: DC

## 2014-08-28 MED ORDER — FENTANYL CITRATE 0.05 MG/ML IJ SOLN
INTRAMUSCULAR | Status: AC
  Filled 2014-08-28: qty 6

## 2014-08-28 MED ORDER — MIDAZOLAM HCL 5 MG/5ML IJ SOLN
INTRAMUSCULAR | Status: DC | PRN
  Administered 2014-08-28: 2 mg via INTRAVENOUS

## 2014-08-28 MED ORDER — LACTATED RINGERS IV SOLN
INTRAVENOUS | Status: DC
  Administered 2014-08-28: 11:00:00 via INTRAVENOUS

## 2014-08-28 MED ORDER — BUPIVACAINE-EPINEPHRINE (PF) 0.5% -1:200000 IJ SOLN
INTRAMUSCULAR | Status: AC
  Filled 2014-08-28: qty 30

## 2014-08-28 MED ORDER — EPHEDRINE SULFATE 50 MG/ML IJ SOLN
INTRAMUSCULAR | Status: DC | PRN
  Administered 2014-08-28 (×2): 10 mg via INTRAVENOUS

## 2014-08-28 MED ORDER — LIDOCAINE HCL (CARDIAC) 20 MG/ML IV SOLN
INTRAVENOUS | Status: DC | PRN
  Administered 2014-08-28: 50 mg via INTRAVENOUS

## 2014-08-28 MED ORDER — PROPOFOL 10 MG/ML IV BOLUS
INTRAVENOUS | Status: DC | PRN
  Administered 2014-08-28: 150 mg via INTRAVENOUS

## 2014-08-28 MED ORDER — DEXAMETHASONE SODIUM PHOSPHATE 4 MG/ML IJ SOLN
INTRAMUSCULAR | Status: DC | PRN
  Administered 2014-08-28: 8 mg via INTRAVENOUS

## 2014-08-28 MED ORDER — CEFAZOLIN SODIUM-DEXTROSE 2-3 GM-% IV SOLR
2.0000 g | INTRAVENOUS | Status: AC
  Administered 2014-08-28: 2 g via INTRAVENOUS

## 2014-08-28 MED ORDER — MIDAZOLAM HCL 2 MG/2ML IJ SOLN
INTRAMUSCULAR | Status: AC
  Filled 2014-08-28: qty 2

## 2014-08-28 MED ORDER — FENTANYL CITRATE 0.05 MG/ML IJ SOLN
INTRAMUSCULAR | Status: DC | PRN
  Administered 2014-08-28: 100 ug via INTRAVENOUS

## 2014-08-28 MED ORDER — MIDAZOLAM HCL 2 MG/2ML IJ SOLN: 1.0000 mg | INTRAMUSCULAR | Status: DC | PRN

## 2014-08-28 MED ORDER — CEFAZOLIN SODIUM-DEXTROSE 2-3 GM-% IV SOLR
INTRAVENOUS | Status: AC
  Filled 2014-08-28: qty 50

## 2014-08-28 SURGICAL SUPPLY — 64 items
APPLIER CLIP 9.375 MED OPEN (MISCELLANEOUS)
BENZOIN TINCTURE PRP APPL 2/3 (GAUZE/BANDAGES/DRESSINGS) IMPLANT
BINDER BREAST LRG (GAUZE/BANDAGES/DRESSINGS) IMPLANT
BINDER BREAST MEDIUM (GAUZE/BANDAGES/DRESSINGS) ×3 IMPLANT
BINDER BREAST XLRG (GAUZE/BANDAGES/DRESSINGS) IMPLANT
BINDER BREAST XXLRG (GAUZE/BANDAGES/DRESSINGS) IMPLANT
BLADE HEX COATED 2.75 (ELECTRODE) ×3 IMPLANT
BLADE SURG 10 STRL SS (BLADE) IMPLANT
BLADE SURG 15 STRL LF DISP TIS (BLADE) ×1 IMPLANT
BLADE SURG 15 STRL SS (BLADE) ×2
CANISTER SUC SOCK COL 7IN (MISCELLANEOUS) IMPLANT
CANISTER SUCT 1200ML W/VALVE (MISCELLANEOUS) ×3 IMPLANT
CHLORAPREP W/TINT 26ML (MISCELLANEOUS) ×3 IMPLANT
CLIP APPLIE 9.375 MED OPEN (MISCELLANEOUS) IMPLANT
CLOSURE WOUND 1/2 X4 (GAUZE/BANDAGES/DRESSINGS)
COVER BACK TABLE 60X90IN (DRAPES) ×3 IMPLANT
COVER MAYO STAND STRL (DRAPES) ×3 IMPLANT
COVER PROBE W GEL 5X96 (DRAPES) ×3 IMPLANT
DECANTER SPIKE VIAL GLASS SM (MISCELLANEOUS) IMPLANT
DEVICE DUBIN W/COMP PLATE 8390 (MISCELLANEOUS) ×3 IMPLANT
DRAIN CHANNEL 19F RND (DRAIN) IMPLANT
DRAPE LAPAROSCOPIC ABDOMINAL (DRAPES) ×3 IMPLANT
DRAPE UTILITY XL STRL (DRAPES) ×3 IMPLANT
DRSG PAD ABDOMINAL 8X10 ST (GAUZE/BANDAGES/DRESSINGS) ×3 IMPLANT
ELECT REM PT RETURN 9FT ADLT (ELECTROSURGICAL) ×3
ELECTRODE REM PT RTRN 9FT ADLT (ELECTROSURGICAL) ×1 IMPLANT
EVACUATOR SILICONE 100CC (DRAIN) IMPLANT
GLOVE BIO SURGEON STRL SZ 6.5 (GLOVE) ×2 IMPLANT
GLOVE BIO SURGEONS STRL SZ 6.5 (GLOVE) ×1
GLOVE BIOGEL PI IND STRL 7.0 (GLOVE) ×1 IMPLANT
GLOVE BIOGEL PI INDICATOR 7.0 (GLOVE) ×2
GLOVE EUDERMIC 7 POWDERFREE (GLOVE) ×3 IMPLANT
GLOVE EXAM NITRILE LRG STRL (GLOVE) ×3 IMPLANT
GOWN STRL REUS W/ TWL LRG LVL3 (GOWN DISPOSABLE) ×1 IMPLANT
GOWN STRL REUS W/ TWL XL LVL3 (GOWN DISPOSABLE) ×2 IMPLANT
GOWN STRL REUS W/TWL LRG LVL3 (GOWN DISPOSABLE) ×2
GOWN STRL REUS W/TWL XL LVL3 (GOWN DISPOSABLE) ×4
KIT MARKER MARGIN INK (KITS) IMPLANT
LIQUID BAND (GAUZE/BANDAGES/DRESSINGS) IMPLANT
NEEDLE HYPO 25X1 1.5 SAFETY (NEEDLE) ×3 IMPLANT
NS IRRIG 1000ML POUR BTL (IV SOLUTION) ×3 IMPLANT
PACK BASIN DAY SURGERY FS (CUSTOM PROCEDURE TRAY) ×3 IMPLANT
PENCIL BUTTON HOLSTER BLD 10FT (ELECTRODE) ×3 IMPLANT
PIN SAFETY STERILE (MISCELLANEOUS) ×3 IMPLANT
SHEET MEDIUM DRAPE 40X70 STRL (DRAPES) IMPLANT
SLEEVE SCD COMPRESS KNEE MED (MISCELLANEOUS) ×3 IMPLANT
SPONGE GAUZE 4X4 12PLY STER LF (GAUZE/BANDAGES/DRESSINGS) IMPLANT
SPONGE LAP 18X18 X RAY DECT (DISPOSABLE) IMPLANT
SPONGE LAP 4X18 X RAY DECT (DISPOSABLE) ×3 IMPLANT
STRIP CLOSURE SKIN 1/2X4 (GAUZE/BANDAGES/DRESSINGS) IMPLANT
SUT ETHILON 3 0 FSL (SUTURE) IMPLANT
SUT MNCRL AB 4-0 PS2 18 (SUTURE) ×3 IMPLANT
SUT SILK 2 0 SH (SUTURE) ×3 IMPLANT
SUT VIC AB 2-0 CT1 27 (SUTURE)
SUT VIC AB 2-0 CT1 TAPERPNT 27 (SUTURE) IMPLANT
SUT VIC AB 3-0 SH 27 (SUTURE)
SUT VIC AB 3-0 SH 27X BRD (SUTURE) IMPLANT
SUT VICRYL 3-0 CR8 SH (SUTURE) ×3 IMPLANT
SYRINGE 10CC LL (SYRINGE) ×3 IMPLANT
TOWEL OR 17X24 6PK STRL BLUE (TOWEL DISPOSABLE) ×3 IMPLANT
TOWEL OR NON WOVEN STRL DISP B (DISPOSABLE) IMPLANT
TUBE CONNECTING 20'X1/4 (TUBING) ×1
TUBE CONNECTING 20X1/4 (TUBING) ×2 IMPLANT
YANKAUER SUCT BULB TIP NO VENT (SUCTIONS) ×3 IMPLANT

## 2014-08-28 NOTE — Anesthesia Preprocedure Evaluation (Signed)
Anesthesia Evaluation  Patient identified by MRN, date of birth, ID band Patient awake    Reviewed: Allergy & Precautions, NPO status , Patient's Chart, lab work & pertinent test results  Airway Mallampati: I       Dental   Pulmonary neg pulmonary ROS,  breath sounds clear to auscultation        Cardiovascular hypertension, Rhythm:Regular Rate:Normal     Neuro/Psych negative neurological ROS     GI/Hepatic negative GI ROS, Neg liver ROS,   Endo/Other  negative endocrine ROS  Renal/GU negative Renal ROS     Musculoskeletal negative musculoskeletal ROS (+)   Abdominal   Peds  Hematology negative hematology ROS (+)   Anesthesia Other Findings   Reproductive/Obstetrics                             Anesthesia Physical Anesthesia Plan  ASA: II  Anesthesia Plan: General   Post-op Pain Management:    Induction: Intravenous  Airway Management Planned: LMA  Additional Equipment:   Intra-op Plan:   Post-operative Plan: Extubation in OR  Informed Consent: I have reviewed the patients History and Physical, chart, labs and discussed the procedure including the risks, benefits and alternatives for the proposed anesthesia with the patient or authorized representative who has indicated his/her understanding and acceptance.     Plan Discussed with:   Anesthesia Plan Comments:         Anesthesia Quick Evaluation

## 2014-08-28 NOTE — Discharge Instructions (Signed)
[facility] °Office Phone Number [phone] ° °BREAST BIOPSY/ PARTIAL MASTECTOMY: POST OP INSTRUCTIONS ° °Always review your discharge instruction sheet given to you by the facility where your surgery was performed. ° °IF YOU HAVE DISABILITY OR FAMILY LEAVE FORMS, YOU MUST BRING THEM TO THE OFFICE FOR PROCESSING.  DO NOT GIVE THEM TO YOUR DOCTOR. ° °1. A prescription for pain medication may be given to you upon discharge.  Take your pain medication as prescribed, if needed.  If narcotic pain medicine is not needed, then you may take acetaminophen (Tylenol) or ibuprofen (Advil) as needed. °2. Take your usually prescribed medications unless otherwise directed °3. If you need a refill on your pain medication, please contact your pharmacy.  They will contact our office to request authorization.  Prescriptions will not be filled after 5pm or on week-ends. °4. You should eat very light the first 24 hours after surgery, such as soup, crackers, pudding, etc.  Resume your normal diet the day after surgery. °5. Most patients will experience some swelling and bruising in the breast.  Ice packs and a good support bra will help.  Swelling and bruising can take several days to resolve.  °6. It is common to experience some constipation if taking pain medication after surgery.  Increasing fluid intake and taking a stool softener will usually help or prevent this problem from occurring.  A mild laxative (Milk of Magnesia or Miralax) should be taken according to package directions if there are no bowel movements after 48 hours. °7. Unless discharge instructions indicate otherwise, you may remove your bandages 24-48 hours after surgery, and you may shower at that time.  You may have steri-strips (small skin tapes) in place directly over the incision.  These strips should be left on the skin for 7-10 days.  If your surgeon used skin glue on the incision, you may shower in 24 hours.  The glue will flake off over the  next 2-3 weeks.  Any sutures or staples will be removed at the office during your follow-up visit. °8. ACTIVITIES:  You may resume regular daily activities (gradually increasing) beginning the next day.  Wearing a good support bra or sports bra minimizes pain and swelling.  You may have sexual intercourse when it is comfortable. °a. You may drive when you no longer are taking prescription pain medication, you can comfortably wear a seatbelt, and you can safely maneuver your car and apply brakes. °b. RETURN TO WORK:  ______________________________________________________________________________________ °9. You should see your doctor in the office for a follow-up appointment approximately two weeks after your surgery.  Your doctor’s nurse will typically make your follow-up appointment when she calls you with your pathology report.  Expect your pathology report 2-3 business days after your surgery.  You may call to check if you do not hear from us after three days. °10. OTHER INSTRUCTIONS: _______________________________________________________________________________________________ _____________________________________________________________________________________________________________________________________ °_____________________________________________________________________________________________________________________________________ °_____________________________________________________________________________________________________________________________________ ° °WHEN TO CALL YOUR DOCTOR: °1. Fever over 101.0 °2. Nausea and/or vomiting. °3. Extreme swelling or bruising. °4. Continued bleeding from incision. °5. Increased pain, redness, or drainage from the incision. ° °The clinic staff is available to answer your questions during regular business hours.  Please don’t hesitate to call and ask to speak to one of the nurses for clinical concerns.  If you have a medical emergency, go to the nearest  emergency room or call 911.  A surgeon from [facility] is always on call at the hospital. ° °For further questions, please visit [email or web address]  ° ° °  Post Anesthesia Home Care Instructions ° °Activity: °Get plenty of rest for the remainder of the day. A responsible adult should stay with you for 24 hours following the procedure.  °For the next 24 hours, DO NOT: °-Drive a car °-Operate machinery °-Drink alcoholic beverages °-Take any medication unless instructed by your physician °-Make any legal decisions or sign important papers. ° °Meals: °Start with liquid foods such as gelatin or soup. Progress to regular foods as tolerated. Avoid greasy, spicy, heavy foods. If nausea and/or vomiting occur, drink only clear liquids until the nausea and/or vomiting subsides. Call your physician if vomiting continues. ° °Special Instructions/Symptoms: °Your throat may feel dry or sore from the anesthesia or the breathing tube placed in your throat during surgery. If this causes discomfort, gargle with warm salt water. The discomfort should disappear within 24 hours. ° °

## 2014-08-28 NOTE — Anesthesia Procedure Notes (Signed)
Procedure Name: LMA Insertion Date/Time: 08/28/2014 10:56 AM Performed by: Melynda Ripple D Pre-anesthesia Checklist: Patient identified, Emergency Drugs available, Suction available and Patient being monitored Patient Re-evaluated:Patient Re-evaluated prior to inductionOxygen Delivery Method: Circle System Utilized Preoxygenation: Pre-oxygenation with 100% oxygen Intubation Type: IV induction Ventilation: Mask ventilation without difficulty LMA: LMA inserted LMA Size: 3.0 Number of attempts: 1 Airway Equipment and Method: Bite block Placement Confirmation: positive ETCO2 Tube secured with: Tape Dental Injury: Teeth and Oropharynx as per pre-operative assessment

## 2014-08-28 NOTE — Op Note (Signed)
Patient Name:           Becky Rogers   Date of Surgery:        08/28/2014  Pre op Diagnosis:      Complex sclerosing lesion with calcifications, right breast  Post op Diagnosis:    Same  Procedure:                 Right breast lumpectomy with radioactive seed localization  Surgeon:                     Edsel Petrin. Dalbert Batman, M.D., FACS  Assistant:                      OR staff  Operative Indications:    This is a 64 year old Caucasian female, referred by Dr. Marin Olp at [facility] for complex sclerosing lesion right breast, lower inner quadrant with suspicious microcalcifications. Dr. Bing Matter, PA and Dr. Kathryne Eriksson serve for primary care. Dr. Mosetta Anis is her neurologist. The patient has not had a breast problem in the past. Last mammogram was 18 months ago. Recent screening mammograms and subsequent right breast mammogram and ultrasound showed an area of calcifications in the right breast, lower inner quadrant, 2.5 cm x 0.8 cm x 0.4 cm. Some of these calcifications are linear. Image guided biopsy shows complex sclerosing lesion, usual ductal hyperplasia. Family history is negative for breast or ovarian cancer. Comorbidities include hyperlipidemia, hypertension, very early parkinsonism, with tremor well-controlled on carbidopa. Borderline dementia. Completely functional otherwise. Marland Kitchen   Operative Findings:       The marker clip and radioactive seed are seen on the mammogram at the 5:30 position of the right breast. I-125 radioactive signal was identified in the lower pole of the right breast in the holding area. The specimen mammogram showed the radioactive seed and the marker clip. There was no residual radioactivity in the right breast following the lumpectomy.  Procedure in Detail:          Following the induction of general LMA anesthesia the patient's right breast was prepped and draped in a sterile fashion, surgical  timeout was performed, intravenous antibiotics were infused. I used the neoprobe to find the most intense radioactivity which was at the 6:00 position midway between the inframammary crease and the lower areolar margin. This area was infiltrated with 0.5% Marcaine with epinephrine. A radially oriented incision was made at the 6:00 position of the right breast. Dissection was carried down into the breast tissue and around the radioactive signal using the neoprobe frequently. The specimen was removed. The radioactivity is identified within the specimen. The specimen was marked with silk sutures in 3 cardinal positions to orient the pathologist. The specimen mammogram looked good as described above. Specimen was marked and sent to the lab. The wound was irrigated with saline. Hemostasis was excellent. The breast tissues were closed in multiple layers with interrupted sutures of 3-0 Vicryl and skin closed with a running subcuticular suture of 4-0 Monocryl and Dermabond. Breast binder was placed and patient taken to PACU in stable condition. EBL 10 mL. Counts correct. Complications none.     Edsel Petrin. Dalbert Batman, M.D., FACS General and Minimally Invasive Surgery Breast and Colorectal Surgery  08/28/2014 11:45 AM

## 2014-08-28 NOTE — Anesthesia Postprocedure Evaluation (Signed)
  Anesthesia Post-op Note  Patient: Becky Rogers  Procedure(s) Performed: Procedure(s): RIGHT BREAST LUMPECTOMY WITH RADIOACTIVE SEED LOCALIZATION (Right)  Patient Location: PACU  Anesthesia Type:General  Level of Consciousness: awake and alert   Airway and Oxygen Therapy: Patient Spontanous Breathing  Post-op Pain: mild  Post-op Assessment: Post-op Vital signs reviewed  Post-op Vital Signs: stable  Last Vitals:  Filed Vitals:   08/28/14 1300  BP: 133/68  Pulse: 62  Temp: 36.7 C  Resp: 18    Complications: No apparent anesthesia complications

## 2014-08-28 NOTE — Transfer of Care (Signed)
Immediate Anesthesia Transfer of Care Note  Patient: Becky Rogers  Procedure(s) Performed: Procedure(s): RIGHT BREAST LUMPECTOMY WITH RADIOACTIVE SEED LOCALIZATION (Right)  Patient Location: PACU  Anesthesia Type:General  Level of Consciousness: awake, alert  and oriented  Airway & Oxygen Therapy: Patient Spontanous Breathing and Patient connected to face mask oxygen  Post-op Assessment: Report given to RN and Post -op Vital signs reviewed and stable  Post vital signs: Reviewed and stable  Last Vitals:  Filed Vitals:   08/28/14 1013  BP: 128/64  Pulse: 65  Temp: 36.7 C  Resp: 16    Complications: No apparent anesthesia complications

## 2014-08-28 NOTE — Interval H&P Note (Signed)
History and Physical Interval Note:  08/28/2014 10:36 AM  Becky Rogers  has presented today for surgery, with the diagnosis of Right breast sclerosing lesion  The various methods of treatment have been discussed with the patient and family. After consideration of risks, benefits and other options for treatment, the patient has consented to  Procedure(s): RIGHT BREAST LUMPECTOMY WITH RADIOACTIVE SEED LOCALIZATION (Right) as a surgical intervention .  The patient's history has been reviewed, patient examined today, no change in status, stable for surgery.  I have reviewed the patient's chart and labs.  Questions were answered to the patient's satisfaction.     Becky Rogers

## 2014-08-29 ENCOUNTER — Encounter (HOSPITAL_BASED_OUTPATIENT_CLINIC_OR_DEPARTMENT_OTHER): Payer: Self-pay | Admitting: General Surgery

## 2014-08-29 NOTE — Progress Notes (Signed)
Quick Note:  Inform patient of Pathology report,. No cancer seen. Just sclerosing lesion, a type of scarring and inflammation. Will discuss in detail at next office visit.  hmi ______

## 2014-10-04 DIAGNOSIS — I10 Essential (primary) hypertension: Secondary | ICD-10-CM | POA: Insufficient documentation

## 2015-01-09 ENCOUNTER — Encounter (HOSPITAL_COMMUNITY): Payer: Self-pay | Admitting: Emergency Medicine

## 2015-01-09 ENCOUNTER — Inpatient Hospital Stay (HOSPITAL_COMMUNITY)
Admission: EM | Admit: 2015-01-09 | Discharge: 2015-01-16 | DRG: 853 | Disposition: A | Discharge status: Rehabilitation Facility | Payer: BLUE CROSS/BLUE SHIELD | Attending: Internal Medicine | Admitting: Internal Medicine

## 2015-01-09 ENCOUNTER — Emergency Department (HOSPITAL_COMMUNITY): Payer: BLUE CROSS/BLUE SHIELD

## 2015-01-09 DIAGNOSIS — E876 Hypokalemia: Secondary | ICD-10-CM | POA: Diagnosis not present

## 2015-01-09 DIAGNOSIS — G20 Parkinson's disease: Secondary | ICD-10-CM | POA: Diagnosis present

## 2015-01-09 DIAGNOSIS — L97519 Non-pressure chronic ulcer of other part of right foot with unspecified severity: Secondary | ICD-10-CM | POA: Diagnosis present

## 2015-01-09 DIAGNOSIS — E780 Pure hypercholesterolemia: Secondary | ICD-10-CM | POA: Diagnosis present

## 2015-01-09 DIAGNOSIS — N136 Pyonephrosis: Secondary | ICD-10-CM | POA: Diagnosis present

## 2015-01-09 DIAGNOSIS — A4151 Sepsis due to Escherichia coli [E. coli]: Principal | ICD-10-CM | POA: Diagnosis present

## 2015-01-09 DIAGNOSIS — R6521 Severe sepsis with septic shock: Secondary | ICD-10-CM | POA: Diagnosis present

## 2015-01-09 DIAGNOSIS — N12 Tubulo-interstitial nephritis, not specified as acute or chronic: Secondary | ICD-10-CM | POA: Diagnosis not present

## 2015-01-09 DIAGNOSIS — G9341 Metabolic encephalopathy: Secondary | ICD-10-CM | POA: Diagnosis not present

## 2015-01-09 DIAGNOSIS — R1030 Lower abdominal pain, unspecified: Secondary | ICD-10-CM

## 2015-01-09 DIAGNOSIS — L97529 Non-pressure chronic ulcer of other part of left foot with unspecified severity: Secondary | ICD-10-CM | POA: Diagnosis present

## 2015-01-09 DIAGNOSIS — Z79899 Other long term (current) drug therapy: Secondary | ICD-10-CM | POA: Diagnosis not present

## 2015-01-09 DIAGNOSIS — J90 Pleural effusion, not elsewhere classified: Secondary | ICD-10-CM | POA: Diagnosis present

## 2015-01-09 DIAGNOSIS — N170 Acute kidney failure with tubular necrosis: Secondary | ICD-10-CM | POA: Diagnosis present

## 2015-01-09 DIAGNOSIS — R918 Other nonspecific abnormal finding of lung field: Secondary | ICD-10-CM

## 2015-01-09 DIAGNOSIS — R40241 Glasgow coma scale score 13-15: Secondary | ICD-10-CM | POA: Diagnosis not present

## 2015-01-09 DIAGNOSIS — I998 Other disorder of circulatory system: Secondary | ICD-10-CM | POA: Diagnosis present

## 2015-01-09 DIAGNOSIS — R0902 Hypoxemia: Secondary | ICD-10-CM | POA: Diagnosis not present

## 2015-01-09 DIAGNOSIS — N390 Urinary tract infection, site not specified: Secondary | ICD-10-CM | POA: Diagnosis not present

## 2015-01-09 DIAGNOSIS — D72829 Elevated white blood cell count, unspecified: Secondary | ICD-10-CM | POA: Diagnosis present

## 2015-01-09 DIAGNOSIS — R7881 Bacteremia: Secondary | ICD-10-CM | POA: Diagnosis not present

## 2015-01-09 DIAGNOSIS — T380X5A Adverse effect of glucocorticoids and synthetic analogues, initial encounter: Secondary | ICD-10-CM | POA: Diagnosis present

## 2015-01-09 DIAGNOSIS — Z452 Encounter for adjustment and management of vascular access device: Secondary | ICD-10-CM | POA: Diagnosis not present

## 2015-01-09 DIAGNOSIS — K8020 Calculus of gallbladder without cholecystitis without obstruction: Secondary | ICD-10-CM | POA: Diagnosis present

## 2015-01-09 DIAGNOSIS — B9620 Unspecified Escherichia coli [E. coli] as the cause of diseases classified elsewhere: Secondary | ICD-10-CM | POA: Diagnosis present

## 2015-01-09 DIAGNOSIS — R410 Disorientation, unspecified: Secondary | ICD-10-CM | POA: Diagnosis not present

## 2015-01-09 DIAGNOSIS — E785 Hyperlipidemia, unspecified: Secondary | ICD-10-CM | POA: Diagnosis present

## 2015-01-09 DIAGNOSIS — D696 Thrombocytopenia, unspecified: Secondary | ICD-10-CM | POA: Diagnosis present

## 2015-01-09 DIAGNOSIS — Z7982 Long term (current) use of aspirin: Secondary | ICD-10-CM

## 2015-01-09 DIAGNOSIS — G92 Toxic encephalopathy: Secondary | ICD-10-CM | POA: Diagnosis present

## 2015-01-09 DIAGNOSIS — R401 Stupor: Secondary | ICD-10-CM | POA: Diagnosis not present

## 2015-01-09 DIAGNOSIS — N179 Acute kidney failure, unspecified: Secondary | ICD-10-CM | POA: Diagnosis present

## 2015-01-09 DIAGNOSIS — G9340 Encephalopathy, unspecified: Secondary | ICD-10-CM | POA: Diagnosis not present

## 2015-01-09 DIAGNOSIS — J9601 Acute respiratory failure with hypoxia: Secondary | ICD-10-CM | POA: Diagnosis not present

## 2015-01-09 DIAGNOSIS — R1310 Dysphagia, unspecified: Secondary | ICD-10-CM | POA: Diagnosis present

## 2015-01-09 DIAGNOSIS — R5381 Other malaise: Secondary | ICD-10-CM | POA: Diagnosis not present

## 2015-01-09 DIAGNOSIS — F0280 Dementia in other diseases classified elsewhere without behavioral disturbance: Secondary | ICD-10-CM | POA: Diagnosis present

## 2015-01-09 DIAGNOSIS — I10 Essential (primary) hypertension: Secondary | ICD-10-CM | POA: Diagnosis present

## 2015-01-09 DIAGNOSIS — R4182 Altered mental status, unspecified: Secondary | ICD-10-CM | POA: Diagnosis present

## 2015-01-09 DIAGNOSIS — A419 Sepsis, unspecified organism: Secondary | ICD-10-CM | POA: Diagnosis present

## 2015-01-09 DIAGNOSIS — R609 Edema, unspecified: Secondary | ICD-10-CM | POA: Diagnosis not present

## 2015-01-09 DIAGNOSIS — G20A1 Parkinson's disease without dyskinesia, without mention of fluctuations: Secondary | ICD-10-CM | POA: Diagnosis present

## 2015-01-09 LAB — CBC
HCT: 40.9 % (ref 36.0–46.0)
HEMOGLOBIN: 14 g/dL (ref 12.0–15.0)
MCH: 29.8 pg (ref 26.0–34.0)
MCHC: 34.2 g/dL (ref 30.0–36.0)
MCV: 87 fL (ref 78.0–100.0)
Platelets: 98 10*3/uL — ABNORMAL LOW (ref 150–400)
RBC: 4.7 MIL/uL (ref 3.87–5.11)
RDW: 12.7 % (ref 11.5–15.5)
WBC: 8.6 10*3/uL (ref 4.0–10.5)

## 2015-01-09 LAB — COMPREHENSIVE METABOLIC PANEL
ALK PHOS: 270 U/L — AB (ref 38–126)
ALT: 20 U/L (ref 14–54)
AST: 42 U/L — ABNORMAL HIGH (ref 15–41)
Albumin: 3.2 g/dL — ABNORMAL LOW (ref 3.5–5.0)
Anion gap: 14 (ref 5–15)
BILIRUBIN TOTAL: 1.3 mg/dL — AB (ref 0.3–1.2)
BUN: 38 mg/dL — ABNORMAL HIGH (ref 6–20)
CALCIUM: 8.6 mg/dL — AB (ref 8.9–10.3)
CO2: 21 mmol/L — AB (ref 22–32)
Chloride: 104 mmol/L (ref 101–111)
Creatinine, Ser: 2.9 mg/dL — ABNORMAL HIGH (ref 0.44–1.00)
GFR calc Af Amer: 19 mL/min — ABNORMAL LOW (ref >60)
GFR calc non Af Amer: 16 mL/min — ABNORMAL LOW (ref >60)
GLUCOSE: 92 mg/dL (ref 65–99)
Potassium: 3.3 mmol/L — ABNORMAL LOW (ref 3.5–5.1)
SODIUM: 139 mmol/L (ref 135–145)
TOTAL PROTEIN: 5.4 g/dL — AB (ref 6.5–8.1)

## 2015-01-09 LAB — CBG MONITORING, ED: GLUCOSE-CAPILLARY: 90 mg/dL (ref 65–99)

## 2015-01-09 LAB — I-STAT CHEM 8, ED
BUN: 38 mg/dL — ABNORMAL HIGH (ref 6–20)
CREATININE: 2.8 mg/dL — AB (ref 0.44–1.00)
Calcium, Ion: 1.1 mmol/L — ABNORMAL LOW (ref 1.13–1.30)
Chloride: 101 mmol/L (ref 101–111)
Glucose, Bld: 88 mg/dL (ref 65–99)
HCT: 43 % (ref 36.0–46.0)
Hemoglobin: 14.6 g/dL (ref 12.0–15.0)
POTASSIUM: 3.2 mmol/L — AB (ref 3.5–5.1)
Sodium: 138 mmol/L (ref 135–145)
TCO2: 19 mmol/L (ref 0–100)

## 2015-01-09 LAB — I-STAT CG4 LACTIC ACID, ED: Lactic Acid, Venous: 5.55 mmol/L (ref 0.5–2.0)

## 2015-01-09 LAB — I-STAT TROPONIN, ED: Troponin i, poc: 0.01 ng/mL (ref 0.00–0.08)

## 2015-01-09 MED ORDER — SODIUM CHLORIDE 0.9 % IV SOLN: 250.0000 mL | INTRAVENOUS | Status: DC | PRN

## 2015-01-09 MED ORDER — SODIUM CHLORIDE 0.9 % IV BOLUS (SEPSIS)
1000.0000 mL | Freq: Once | INTRAVENOUS | Status: DC
  Administered 2015-01-09: 1000 mL via INTRAVENOUS

## 2015-01-09 MED ORDER — SODIUM CHLORIDE 0.9 % IV BOLUS (SEPSIS)
1000.0000 mL | Freq: Once | INTRAVENOUS | Status: AC
Start: 2015-01-09 — End: 2015-01-09
  Administered 2015-01-09: 1000 mL via INTRAVENOUS

## 2015-01-09 MED ORDER — SODIUM CHLORIDE 0.9 % IV SOLN
Freq: Once | INTRAVENOUS | Status: AC
  Administered 2015-01-09: 21:00:00 via INTRAVENOUS

## 2015-01-09 MED ORDER — SODIUM CHLORIDE 0.9 % IV BOLUS (SEPSIS): 500.0000 mL | INTRAVENOUS | Status: DC

## 2015-01-09 MED ORDER — IOHEXOL 300 MG/ML  SOLN: 25.0000 mL | INTRAMUSCULAR | Status: AC

## 2015-01-09 MED ORDER — SODIUM CHLORIDE 0.9 % IV SOLN: INTRAVENOUS | Status: DC

## 2015-01-09 MED ORDER — PIPERACILLIN-TAZOBACTAM 3.375 G IVPB 30 MIN
3.3750 g | Freq: Once | INTRAVENOUS | Status: AC
  Administered 2015-01-09: 3.375 g via INTRAVENOUS
  Filled 2015-01-09: qty 50

## 2015-01-09 MED ORDER — HEPARIN SODIUM (PORCINE) 5000 UNIT/ML IJ SOLN: 5000.0000 [IU] | Freq: Three times a day (TID) | INTRAMUSCULAR | Status: DC

## 2015-01-09 MED ORDER — VANCOMYCIN HCL IN DEXTROSE 1-5 GM/200ML-% IV SOLN
1000.0000 mg | Freq: Once | INTRAVENOUS | Status: DC
  Filled 2015-01-09: qty 200

## 2015-01-09 MED ORDER — VANCOMYCIN HCL IN DEXTROSE 750-5 MG/150ML-% IV SOLN
750.0000 mg | Freq: Once | INTRAVENOUS | Status: AC
  Administered 2015-01-09 (×2): 750 mg via INTRAVENOUS
  Filled 2015-01-09: qty 150

## 2015-01-09 MED ORDER — PIPERACILLIN-TAZOBACTAM 3.375 G IVPB: 3.3750 g | Freq: Three times a day (TID) | INTRAVENOUS | Status: DC

## 2015-01-09 MED ORDER — VANCOMYCIN HCL 500 MG IV SOLR: 500.0000 mg | INTRAVENOUS | Status: DC

## 2015-01-09 MED ORDER — POTASSIUM CHLORIDE 10 MEQ/100ML IV SOLN
10.0000 meq | Freq: Once | INTRAVENOUS | Status: AC
  Administered 2015-01-10: 10 meq via INTRAVENOUS
  Filled 2015-01-09: qty 100

## 2015-01-09 MED ORDER — NOREPINEPHRINE BITARTRATE 1 MG/ML IV SOLN
0.0000 ug/min | Freq: Once | INTRAVENOUS | Status: AC
  Administered 2015-01-09: 2 ug/min via INTRAVENOUS
  Filled 2015-01-09: qty 4

## 2015-01-09 MED ORDER — PANTOPRAZOLE SODIUM 40 MG IV SOLR: 40.0000 mg | Freq: Every day | INTRAVENOUS | Status: DC

## 2015-01-09 MED ORDER — SODIUM CHLORIDE 0.9 % IV BOLUS (SEPSIS): 1000.0000 mL | Freq: Once | INTRAVENOUS | Status: DC

## 2015-01-09 MED ORDER — SODIUM CHLORIDE 0.9 % IV BOLUS (SEPSIS)
1000.0000 mL | Freq: Once | INTRAVENOUS | Status: AC
  Administered 2015-01-09: 1000 mL via INTRAVENOUS

## 2015-01-09 NOTE — Progress Notes (Signed)
ANTIBIOTIC CONSULT NOTE - INITIAL  Pharmacy Consult for Vancomycin and Zosyn Indication: rule out sepsis  No Known Allergies  Patient Measurements:   Adjusted Body Weight:   Vital Signs: Temp: 98.6 F (37 C) (06/30 2104) Temp Source: Oral (06/30 2104) BP: 60/25 mmHg (06/30 2100) Pulse Rate: 101 (06/30 2055) Intake/Output from previous day:   Intake/Output from this shift: Total I/O In: 3000 [I.V.:3000] Out: -   Labs:  Recent Labs  01/09/15 2115  HGB 14.6  CREATININE 2.80*   CrCl cannot be calculated (Unknown ideal weight.). No results for input(s): VANCOTROUGH, VANCOPEAK, VANCORANDOM, GENTTROUGH, GENTPEAK, GENTRANDOM, TOBRATROUGH, TOBRAPEAK, TOBRARND, AMIKACINPEAK, AMIKACINTROU, AMIKACIN in the last 72 hours.   Microbiology: No results found for this or any previous visit (from the past 720 hour(s)).  Medical History: Past Medical History  Diagnosis Date  . Hypertension   . Hypercholesteremia   . Parkinson disease     Medications:   (Not in a hospital admission) Scheduled:  . vancomycin  750 mg Intravenous Once   Infusions:  . sodium chloride    . piperacillin-tazobactam    . [START ON 01/10/2015] vancomycin     Assessment: 64yo female presents from home with abdominal pain and diaphoresis. Pharmacy is consulted to dose vancomycin and zosyn for suspected sepsis. Pt is febrile to 100.3, WBC pending, sCr 2.8 (baseline under 1.0 in February).  Goal of Therapy:  Vancomycin trough level 15-20 mcg/ml  Plan:  Vancomycin 750mg  IV once followed by 500mg  q24h Zosyn 3.375g IV q8h Measure antibiotic drug levels at steady state Follow up culture results, renal function, and clinical course  Andrey Cota. Diona Foley, PharmD Clinical Pharmacist Pager [phone] 01/09/2015,9:29 PM

## 2015-01-09 NOTE — ED Notes (Addendum)
Patient from triage, loss weight in last 6 months, patient is pale and diaphoretic upon arrival to Trauma B.  Patient with CBG of 90 on arrival.  Patient has been complaining of abdominal pain and has become less responsive at home per husband.

## 2015-01-09 NOTE — Consult Note (Deleted)
PULMONARY / CRITICAL CARE MEDICINE   Name: Becky Rogers MRN: [id number] DOB: 1950-09-25    ADMISSION DATE:  01/09/2015 CONSULTATION DATE:  01/09/2015  REFERRING MD :  EDP  CHIEF COMPLAINT:  Abd pain  INITIAL PRESENTATION: 64 year old female with parkinsons dz presented 6/30 with abdominal pain. In ED she was profoundly hypotensive which was not responsive to IVF resuscitation. Lactic acid elevation, started on pressors. PCCM to admit.   STUDIES:  6/30 CT abd > 35m L UPJ stone with mild dhydronephrosis, Gallbladder sludge vs small stones, peripancreatic fluid. Bilateral lower lobe pulmonary opacities.   SIGNIFICANT EVENTS: 6/30 admitted for presumed sepsis   HISTORY OF PRESENT ILLNESS:  64year old with recent diagnosis of [facility]'s disease [facility] of [facility] pain. 40 lb weight loss past 6 months. She was diagnosed with Parkinson's in 11/2014, and has been having trouble getting medications sorted out. Has been struggling with occasional constipation and abd pain, however, feels as though she has been healthy with minimal limitations. 6/29 PM she developed acute onset abdominal pain. Felt as though this may be a bd case of constipation so she waited on it. 6/30 She developed worsening abdominal pain, with malaise and fevers. She presented to ER for this reason. In ED she was noted to be pale and diaphoretic. FAST exam was negative. She was profoundly hypotensive, which was was not responsive to 3L IVF. She was started on norepinephrine and PCCM was called for admission.  PAST MEDICAL HISTORY :   has a past medical history of Hypertension; Hypercholesteremia; and Parkinson disease.  has past surgical history that includes Cesarean section; Ovarian cyst removal; Appendectomy; Colonoscopy; Dental surgery; and Breast lumpectomy with radioactive seed localization (Right, 08/28/2014). Prior to Admission medications   Medication Sig Start Date End Date Taking?  Authorizing Provider  amLODipine (NORVASC) 5 MG tablet Take 5 mg by mouth daily.   Yes Historical Provider, MD  aspirin 81 MG tablet Take 81 mg by mouth daily.   Yes Historical Provider, MD  atorvastatin (LIPITOR) 40 MG tablet Take 40 mg by mouth daily.   Yes Historical Provider, MD  CALCIUM-VITAMIN D PO Take 2 tablets by mouth daily.   Yes Historical Provider, MD  carbidopa-levodopa (SINEMET IR) 25-100 MG per tablet Take 2 tablets by mouth 3 (three) times daily.   Yes Historical Provider, MD  clonazePAM (KLONOPIN) 0.5 MG tablet Take 0.5 mg by mouth at bedtime.   Yes Historical Provider, MD  fish oil-omega-3 fatty acids 1000 MG capsule Take 3 g by mouth daily.   Yes Historical Provider, MD  Multiple Vitamins-Minerals (MULTIVITAMINS THER. W/MINERALS) TABS Take 1 tablet by mouth daily.   Yes Historical Provider, MD  TRIHEXYPHENIDYL HCL PO Take 1 tablet by mouth 2 (two) times daily.   Yes Historical Provider, MD  HYDROcodone-acetaminophen (NORCO) 5-325 MG per tablet Take 1-2 tablets by mouth every 6 (six) hours as needed for moderate pain or severe pain. Patient not taking: Reported on 01/09/2015 08/28/14   HFanny Skates MD   No Known Allergies  FAMILY HISTORY:  has no family status information on file.  SOCIAL HISTORY:  reports that she has never smoked. She does not have any smokeless tobacco history on file. She reports that she drinks alcohol. She reports that she does not use illicit drugs.  REVIEW OF SYSTEMS:  Bolds are positive  Constitutional: weight loss, gain, night sweats, Fevers, chills, fatigue .  HEENT: headaches, Sore throat, sneezing, nasal congestion, post  nasal drip, Difficulty swallowing, Tooth/dental problems, visual complaints visual changes, ear ache CV:  chest pain, radiates: ,Orthopnea, PND, swelling in lower extremities, dizziness, palpitations, syncope.  GI  heartburn, indigestion, abdominal pain, nausea, vomiting, diarrhea, change in bowel habits, loss of appetite,  bloody stools.  Resp: cough, productive: , hemoptysis, dyspnea, chest pain, pleuritic.  Skin: rash or itching or icterus GU: dysuria, change in color of urine, urgency or frequency. flank pain, hematuria  MS: joint pain or swelling. decreased range of motion  Psych: change in mood or affect. depression or anxiety.  Neuro: difficulty with speech, weakness, numbness, ataxia    SUBJECTIVE:   VITAL SIGNS: Temp:  [98.6 F (37 C)-100.3 F (37.9 C)] 98.6 F (37 C) (06/30 2104) Pulse Rate:  [45-101] 79 (06/30 2245) Resp:  [12-26] 19 (06/30 2245) BP: (56-163)/(25-135) 111/45 mmHg (06/30 2245) SpO2:  [92 %-100 %] 94 % (06/30 2245) Weight:  [48.081 kg (106 lb)] 48.081 kg (106 lb) (06/30 2214) HEMODYNAMICS:   VENTILATOR SETTINGS:   INTAKE / OUTPUT:  Intake/Output Summary (Last 24 hours) at 01/09/15 2251 Last data filed at 01/09/15 2122  Gross per 24 hour  Intake   3000 ml  Output      0 ml  Net   3000 ml    PHYSICAL EXAMINATION: General:  Female of normal body habitus in NAD Neuro:  Alert, oriented, mild delirium  HEENT:  Alicia/AT, no JVD noted, PERRL. Erythema to neck and face Cardiovascular:  RRR, no MRG Lungs:  Clear bilateral breath sounds Abdomen:  Soft, non-tender, non-distended Musculoskeletal:  No acute deformity or ROM limitaiton Skin:  Grossly intact, rash to neck as noted above  LABS:  CBC  Recent Labs Lab 01/09/15 2100 01/09/15 2115  WBC 8.6  --   HGB 14.0 14.6  HCT 40.9 43.0  PLT 98*  --    Coag's No results for input(s): APTT, INR in the last 168 hours. BMET  Recent Labs Lab 01/09/15 2100 01/09/15 2115  NA 139 138  K 3.3* 3.2*  CL 104 101  CO2 21*  --   BUN 38* 38*  CREATININE 2.90* 2.80*  GLUCOSE 92 88   Electrolytes  Recent Labs Lab 01/09/15 2100  CALCIUM 8.6*   Sepsis Markers  Recent Labs Lab 01/09/15 2120  LATICACIDVEN 5.55*   ABG No results for input(s): PHART, PCO2ART, PO2ART in the last 168 hours. Liver Enzymes  Recent  Labs Lab 01/09/15 2100  AST 42*  ALT 20  ALKPHOS 270*  BILITOT 1.3*  ALBUMIN 3.2*   Cardiac Enzymes No results for input(s): TROPONINI, PROBNP in the last 168 hours. Glucose  Recent Labs Lab 01/09/15 2058  GLUCAP 90    Imaging Dg Chest Portable 1 View  01/09/2015   CLINICAL DATA:  Weakness and cyanosis.  EXAM: PORTABLE CHEST - 1 VIEW  COMPARISON:  05/08/2007  FINDINGS: A single AP portable view of the chest demonstrates no focal airspace consolidation or alveolar edema. The lungs are grossly clear. There is no large effusion or pneumothorax. Cardiac and mediastinal contours appear unremarkable.  IMPRESSION: No active disease.   Electronically Signed   By: Andreas Newport M.D.   On: 01/09/2015 21:25     ASSESSMENT / PLAN:  PULMONARY A: Bibasilar opacifications on CT concerning for early ALI  P:   Supplemental O2 CXR in AM Incentive spirometry  CARDIOVASCULAR CVL 6/30 >>> A:  Shock, likely septic in setting pyelo  P:  MAP goal > 51m/Hg Volume resuscitation Insert CVL CVP  monitoring goal 8-12 Norepinephrine for MAP goal Ensure lactic clearing  RENAL A:   L UPJ stone and hydronephrosis AKI Hypokalemia  P:   Replete K Follow [facility] Urology consulted, recommends nephrostomy tube via IR.  GASTROINTESTINAL A:   ? Cholelithiasis Upper abdominal, peripancreatic and periportal fluid on CT  P:   NPO IV Protonix Amylase, lipase, alk phos, LFT Consider RUQ [place] to better evaluate gall bladder   HEMATOLOGIC A:   Thrombocytopenia  P:  Follow CBC Heparin SQ for VTE ppx  INFECTIOUS A:   Severe sepsis secondary to L pyelonephritis   P:   BCx2 6/30 >> UC 6/30 >> Vanc Zosyn in ED Abx: Ciprofloxacin 6/30 >>> PCT algorithm   ENDOCRINE A:     No acute issues  P:   Monitor glucose on Chem Add SSI if consecutive over 180 Check TSH, cortisol.   NEUROLOGIC A:   Acute metabolic encephalopathy  P:   RASS goal: 0 Monitor   FAMILY  -  Updates:   - Inter-disciplinary family meet or Palliative Care meeting due by:  7/7   Georgann Housekeeper, AGACNP-BC [facility] Pager [phone] or [phone]  01/09/2015 11:41 PM    PCCM ATTENDING: I have reviewed pt's initial presentation, consultants notes and hospital database in detail.  The above assessment and plan was formulated under my direction.  In summary: Septic shock due to ureteral stone, hydro/pyelonephritis AKI AMS ALI Thrombocytopenia - likely sepsis related  Have discussed with Dr Tresa Moore who has recommended nephrostomy tube I have spoken with Dr Laurence Ferrari and requested perc neph tube placement We will treat with ciprofloxacin CVL to be placed for vasopressors High risk of intubation but doesn't need it presently SCDs for DVT prophylaxis SUP not absolutely indicated unless she gets intubated   40 minutes of independent CCM time was provided by me   Merton Border, MD;  PCCM service; Mobile [phone]

## 2015-01-09 NOTE — ED Notes (Signed)
Called [facility]  

## 2015-01-09 NOTE — ED Notes (Signed)
FAST exam negative

## 2015-01-09 NOTE — ED Notes (Signed)
MD notified of BP, orders received to start levo

## 2015-01-09 NOTE — ED Notes (Signed)
Dr Vanita Panda given a copy of lactic acid results 5.55

## 2015-01-09 NOTE — ED Notes (Signed)
Returned from CT.

## 2015-01-09 NOTE — Progress Notes (Addendum)
ANTIBIOTIC CONSULT NOTE - INITIAL  Pharmacy Consult for Ciprofloxacin Indication: pyelonephritis  No Known Allergies  Patient Measurements: Height: 5\' 2"  (157.5 cm) Weight: 106 lb (48.081 kg) IBW/kg (Calculated) : 50.1  Vital Signs: Temp: 98.6 F (37 C) (06/30 2104) Temp Source: Oral (06/30 2104) BP: 96/44 mmHg (06/30 2336) Pulse Rate: 92 (06/30 2330) Intake/Output from previous day:   Intake/Output from this shift: Total I/O In: 3000 [I.V.:3000] Out: -   Labs:  Recent Labs  01/09/15 2100 01/09/15 2115  WBC 8.6  --   HGB 14.0 14.6  PLT 98*  --   CREATININE 2.90* 2.80*   Estimated Creatinine Clearance: 15.4 mL/min (by C-G formula based on Cr of 2.8). No results for input(s): VANCOTROUGH, VANCOPEAK, VANCORANDOM, GENTTROUGH, GENTPEAK, GENTRANDOM, TOBRATROUGH, TOBRAPEAK, TOBRARND, AMIKACINPEAK, AMIKACINTROU, AMIKACIN in the last 72 hours.   Microbiology: No results found for this or any previous visit (from the past 720 hour(s)).  Medical History: Past Medical History  Diagnosis Date  . Hypertension   . Hypercholesteremia   . Parkinson disease     Medications:  Prescriptions prior to admission  Medication Sig Dispense Refill Last Dose  . amLODipine (NORVASC) 5 MG tablet Take 5 mg by mouth daily.   01/08/2015 at Unknown time  . aspirin 81 MG tablet Take 81 mg by mouth daily.   01/08/2015 at Unknown time  . atorvastatin (LIPITOR) 40 MG tablet Take 40 mg by mouth daily.   01/08/2015 at Unknown time  . CALCIUM-VITAMIN D PO Take 2 tablets by mouth daily.   01/09/2015 at Unknown time  . carbidopa-levodopa (SINEMET IR) 25-100 MG per tablet Take 2 tablets by mouth 3 (three) times daily.   01/08/2015 at Unknown time  . clonazePAM (KLONOPIN) 0.5 MG tablet Take 0.5 mg by mouth at bedtime.   01/08/2015 at Unknown time  . fish oil-omega-3 fatty acids 1000 MG capsule Take 3 g by mouth daily.   01/08/2015 at Unknown time  . Multiple Vitamins-Minerals (MULTIVITAMINS THER. W/MINERALS)  TABS Take 1 tablet by mouth daily.   01/08/2015 at Unknown time  . TRIHEXYPHENIDYL HCL PO Take 1 tablet by mouth 2 (two) times daily.   01/08/2015 at Unknown time  . HYDROcodone-acetaminophen (NORCO) 5-325 MG per tablet Take 1-2 tablets by mouth every 6 (six) hours as needed for moderate pain or severe pain. (Patient not taking: Reported on 01/09/2015) 30 tablet 0 Not Taking at Unknown time   Scheduled:  . heparin  5,000 Units Subcutaneous 3 times per day  . iohexol  25 mL Oral Q1 Hr x 2  . pantoprazole (PROTONIX) IV  40 mg Intravenous QHS  . [START ON 01/10/2015] potassium chloride  10 mEq Intravenous Once   Infusions:  . sodium chloride     Assessment: 64yo female presents from home with abdominal pain and diaphoresis. Initially started on Vanc and Zosyn for presumed sepsis. Source is L pyelonephritis so abx changed to Ciprofloxacin. SCr 2.8 (baseline under 1.0 in February), est CrCl 15 ml/min. Tm 100.3. WBC wnl. LA 5.55.  Goal of Therapy:  Resolution of infection  Plan:  Ciprofloxacin 400mg  IV q24h Will f/u renal function, micro data, pt's clinical condition  Sherlon Handing, PharmD, BCPS Clinical pharmacist, pager [phone] 01/09/2015,11:55 PM   Addendum: Blood cultures are positive for Gram negative rods.  Asked to add [place]. Plan:  Ceftazidime 2gm IV daily  Heide Guile, PharmD, BCPS-AQ ID Clinical Pharmacist Pager [phone]

## 2015-01-09 NOTE — ED Notes (Signed)
Paul Hoffman, NP at bedside. 

## 2015-01-10 ENCOUNTER — Inpatient Hospital Stay (HOSPITAL_COMMUNITY): Payer: BLUE CROSS/BLUE SHIELD

## 2015-01-10 DIAGNOSIS — N136 Pyonephrosis: Secondary | ICD-10-CM | POA: Diagnosis present

## 2015-01-10 LAB — URINE MICROSCOPIC-ADD ON

## 2015-01-10 LAB — BASIC METABOLIC PANEL
Anion gap: 15 (ref 5–15)
Anion gap: 10 (ref 5–15)
BUN: 44 mg/dL — ABNORMAL HIGH (ref 6–20)
BUN: 38 mg/dL — AB (ref 6–20)
CHLORIDE: 108 mmol/L (ref 101–111)
CO2: 17 mmol/L — AB (ref 22–32)
CO2: 15 mmol/L — AB (ref 22–32)
Calcium: 6.9 mg/dL — ABNORMAL LOW (ref 8.9–10.3)
Calcium: 7.2 mg/dL — ABNORMAL LOW (ref 8.9–10.3)
Chloride: 109 mmol/L (ref 101–111)
Creatinine, Ser: 2.62 mg/dL — ABNORMAL HIGH (ref 0.44–1.00)
Creatinine, Ser: 3.05 mg/dL — ABNORMAL HIGH (ref 0.44–1.00)
GFR calc Af Amer: 21 mL/min — ABNORMAL LOW (ref >60)
GFR calc Af Amer: 18 mL/min — ABNORMAL LOW (ref >60)
GFR calc non Af Amer: 18 mL/min — ABNORMAL LOW (ref >60)
GFR calc non Af Amer: 15 mL/min — ABNORMAL LOW (ref >60)
Glucose, Bld: 150 mg/dL — ABNORMAL HIGH (ref 65–99)
Glucose, Bld: 131 mg/dL — ABNORMAL HIGH (ref 65–99)
Potassium: 3 mmol/L — ABNORMAL LOW (ref 3.5–5.1)
Potassium: 4.3 mmol/L (ref 3.5–5.1)
Sodium: 138 mmol/L (ref 135–145)
Sodium: 136 mmol/L (ref 135–145)

## 2015-01-10 LAB — ABO/RH: ABO/RH(D): O POS

## 2015-01-10 LAB — HEPATIC FUNCTION PANEL
ALBUMIN: 2.5 g/dL — AB (ref 3.5–5.0)
ALT: 23 U/L (ref 14–54)
AST: 49 U/L — ABNORMAL HIGH (ref 15–41)
Alkaline Phosphatase: 154 U/L — ABNORMAL HIGH (ref 38–126)
BILIRUBIN TOTAL: 1.1 mg/dL (ref 0.3–1.2)
Bilirubin, Direct: 0.4 mg/dL (ref 0.1–0.5)
Indirect Bilirubin: 0.7 mg/dL (ref 0.3–0.9)
Total Protein: 4.4 g/dL — ABNORMAL LOW (ref 6.5–8.1)

## 2015-01-10 LAB — PROTIME-INR
INR: 2.22 — AB (ref 0.00–1.49)
Prothrombin Time: 24.4 seconds — ABNORMAL HIGH (ref 11.6–15.2)

## 2015-01-10 LAB — TYPE AND SCREEN
ABO/RH(D): O POS
Antibody Screen: NEGATIVE

## 2015-01-10 LAB — URINALYSIS, ROUTINE W REFLEX MICROSCOPIC
Bilirubin Urine: NEGATIVE
GLUCOSE, UA: 250 mg/dL — AB
Ketones, ur: 15 mg/dL — AB
Nitrite: POSITIVE — AB
Protein, ur: >300 mg/dL — AB
Specific Gravity, Urine: <1.005 — ABNORMAL LOW (ref 1.005–1.030)
UROBILINOGEN UA: 4 mg/dL — AB (ref 0.0–1.0)
pH: 6.5 (ref 5.0–8.0)

## 2015-01-10 LAB — CBC
HCT: 41.9 % (ref 36.0–46.0)
HEMOGLOBIN: 14.4 g/dL (ref 12.0–15.0)
MCH: 30.2 pg (ref 26.0–34.0)
MCHC: 34.4 g/dL (ref 30.0–36.0)
MCV: 87.8 fL (ref 78.0–100.0)
Platelets: 80 10*3/uL — ABNORMAL LOW (ref 150–400)
RBC: 4.77 MIL/uL (ref 3.87–5.11)
RDW: 12.9 % (ref 11.5–15.5)
WBC: 3.2 10*3/uL — ABNORMAL LOW (ref 4.0–10.5)

## 2015-01-10 LAB — GLUCOSE, CAPILLARY: GLUCOSE-CAPILLARY: 117 mg/dL — AB (ref 65–99)

## 2015-01-10 LAB — PROCALCITONIN
PROCALCITONIN: 95.51 ng/mL
Procalcitonin: 81.8 ng/mL

## 2015-01-10 LAB — OCCULT BLOOD X 1 CARD TO LAB, STOOL: Fecal Occult Bld: POSITIVE — AB

## 2015-01-10 LAB — CORTISOL
Cortisol, Plasma: 50.6 ug/dL
Cortisol, Plasma: 21 ug/dL

## 2015-01-10 LAB — MRSA PCR SCREENING: MRSA by PCR: NEGATIVE

## 2015-01-10 LAB — PHOSPHORUS: Phosphorus: 2.9 mg/dL (ref 2.5–4.6)

## 2015-01-10 LAB — MAGNESIUM: Magnesium: 1.4 mg/dL — ABNORMAL LOW (ref 1.7–2.4)

## 2015-01-10 LAB — LACTIC ACID, PLASMA
Lactic Acid, Venous: 7.2 mmol/L (ref 0.5–2.0)
Lactic Acid, Venous: 4.1 mmol/L (ref 0.5–2.0)

## 2015-01-10 MED ORDER — DEXTROSE IN LACTATED RINGERS 5 % IV SOLN
INTRAVENOUS | Status: DC
  Administered 2015-01-10 – 2015-01-11 (×3): via INTRAVENOUS

## 2015-01-10 MED ORDER — FENTANYL CITRATE (PF) 100 MCG/2ML IJ SOLN
100.0000 ug | Freq: Once | INTRAMUSCULAR | Status: AC
  Administered 2015-01-10: 100 ug via INTRAVENOUS

## 2015-01-10 MED ORDER — SODIUM CHLORIDE 0.9 % IV BOLUS (SEPSIS)
1000.0000 mL | Freq: Once | INTRAVENOUS | Status: AC
  Administered 2015-01-11: 1000 mL via INTRAVENOUS

## 2015-01-10 MED ORDER — NOREPINEPHRINE BITARTRATE 1 MG/ML IV SOLN
0.0000 ug/min | INTRAVENOUS | Status: DC
  Filled 2015-01-10: qty 4

## 2015-01-10 MED ORDER — MAGNESIUM SULFATE 2 GM/50ML IV SOLN
2.0000 g | Freq: Once | INTRAVENOUS | Status: AC
  Administered 2015-01-10: 2 g via INTRAVENOUS
  Filled 2015-01-10: qty 50

## 2015-01-10 MED ORDER — FENTANYL CITRATE (PF) 100 MCG/2ML IJ SOLN
INTRAMUSCULAR | Status: AC
  Filled 2015-01-10: qty 2

## 2015-01-10 MED ORDER — VASOPRESSIN 20 UNIT/ML IV SOLN
0.0300 [IU]/min | INTRAVENOUS | Status: DC
  Administered 2015-01-11 (×2): 0.03 [IU]/min via INTRAVENOUS
  Filled 2015-01-10 (×2): qty 2

## 2015-01-10 MED ORDER — CETYLPYRIDINIUM CHLORIDE 0.05 % MT LIQD
7.0000 mL | Freq: Two times a day (BID) | OROMUCOSAL | Status: DC
  Administered 2015-01-10 – 2015-01-13 (×7): 7 mL via OROMUCOSAL

## 2015-01-10 MED ORDER — SODIUM CHLORIDE 0.9 % IV SOLN
Freq: Once | INTRAVENOUS | Status: AC
  Administered 2015-01-10: 02:00:00 via INTRAVENOUS

## 2015-01-10 MED ORDER — FENTANYL CITRATE (PF) 100 MCG/2ML IJ SOLN
25.0000 ug | INTRAMUSCULAR | Status: DC | PRN
  Administered 2015-01-10 – 2015-01-14 (×6): 100 ug via INTRAVENOUS
  Filled 2015-01-10 (×7): qty 2

## 2015-01-10 MED ORDER — SODIUM CHLORIDE 0.9 % IJ SOLN: 9.0000 mL | INTRAMUSCULAR | Status: DC | PRN

## 2015-01-10 MED ORDER — MIDAZOLAM HCL 2 MG/2ML IJ SOLN
INTRAMUSCULAR | Status: AC
  Filled 2015-01-10: qty 4

## 2015-01-10 MED ORDER — LIDOCAINE HCL 1 % IJ SOLN
INTRAMUSCULAR | Status: AC
  Filled 2015-01-10: qty 20

## 2015-01-10 MED ORDER — ONDANSETRON HCL 4 MG/2ML IJ SOLN
4.0000 mg | Freq: Four times a day (QID) | INTRAMUSCULAR | Status: DC | PRN
  Administered 2015-01-10: 4 mg via INTRAVENOUS
  Filled 2015-01-10 (×2): qty 2

## 2015-01-10 MED ORDER — DIPHENHYDRAMINE HCL 12.5 MG/5ML PO ELIX
12.5000 mg | ORAL_SOLUTION | Freq: Four times a day (QID) | ORAL | Status: DC | PRN
  Filled 2015-01-10: qty 5

## 2015-01-10 MED ORDER — DEXTROSE 5 % IV SOLN
2.0000 g | INTRAVENOUS | Status: DC
  Administered 2015-01-10: 2 g via INTRAVENOUS
  Filled 2015-01-10 (×2): qty 2

## 2015-01-10 MED ORDER — LACTATED RINGERS IV BOLUS (SEPSIS)
1000.0000 mL | Freq: Once | INTRAVENOUS | Status: AC
  Administered 2015-01-10: 1000 mL via INTRAVENOUS

## 2015-01-10 MED ORDER — NALOXONE HCL 0.4 MG/ML IJ SOLN: 0.4000 mg | INTRAMUSCULAR | Status: DC | PRN

## 2015-01-10 MED ORDER — POTASSIUM CHLORIDE 10 MEQ/50ML IV SOLN
10.0000 meq | INTRAVENOUS | Status: AC
  Administered 2015-01-10 (×4): 10 meq via INTRAVENOUS
  Filled 2015-01-10 (×4): qty 50

## 2015-01-10 MED ORDER — ONDANSETRON HCL 4 MG/2ML IJ SOLN: 4.0000 mg | Freq: Four times a day (QID) | INTRAMUSCULAR | Status: DC | PRN

## 2015-01-10 MED ORDER — FENTANYL CITRATE (PF) 100 MCG/2ML IJ SOLN
INTRAMUSCULAR | Status: AC
  Filled 2015-01-10: qty 4

## 2015-01-10 MED ORDER — HYDROMORPHONE 0.3 MG/ML IV SOLN: INTRAVENOUS | Status: DC

## 2015-01-10 MED ORDER — IOHEXOL 300 MG/ML  SOLN
50.0000 mL | Freq: Once | INTRAMUSCULAR | Status: AC | PRN
  Administered 2015-01-10: 10 mL

## 2015-01-10 MED ORDER — FENTANYL CITRATE (PF) 100 MCG/2ML IJ SOLN
25.0000 ug | INTRAMUSCULAR | Status: DC | PRN
  Administered 2015-01-10: 50 ug via INTRAVENOUS

## 2015-01-10 MED ORDER — CIPROFLOXACIN IN D5W 400 MG/200ML IV SOLN
400.0000 mg | Freq: Every day | INTRAVENOUS | Status: DC
  Administered 2015-01-10 (×2): 400 mg via INTRAVENOUS
  Filled 2015-01-10 (×3): qty 200

## 2015-01-10 MED ORDER — SODIUM CHLORIDE 0.9 % IV BOLUS (SEPSIS): 500.0000 mL | INTRAVENOUS | Status: DC | PRN

## 2015-01-10 MED ORDER — MIDAZOLAM HCL 2 MG/2ML IJ SOLN
INTRAMUSCULAR | Status: AC | PRN
  Administered 2015-01-10 (×2): 0.5 mg via INTRAVENOUS

## 2015-01-10 MED ORDER — DIPHENHYDRAMINE HCL 50 MG/ML IJ SOLN: 12.5000 mg | Freq: Four times a day (QID) | INTRAMUSCULAR | Status: DC | PRN

## 2015-01-10 MED ORDER — NOREPINEPHRINE BITARTRATE 1 MG/ML IV SOLN
0.0000 ug/min | INTRAVENOUS | Status: DC
  Administered 2015-01-10: 40 ug/min via INTRAVENOUS
  Administered 2015-01-10: 9 ug/min via INTRAVENOUS
  Administered 2015-01-10: 38 ug/min via INTRAVENOUS
  Administered 2015-01-10: 13 ug/min via INTRAVENOUS
  Administered 2015-01-11: 18 ug/min via INTRAVENOUS
  Filled 2015-01-10 (×5): qty 16

## 2015-01-10 MED FILL — Medication: Qty: 1 | Status: AC

## 2015-01-10 NOTE — Progress Notes (Signed)
CRITICAL VALUE ALERT  Critical value received:  Lactic Acid 4.1  Date of notification:  01/10/15  Time of notification:  0830 Critical value read back:yes  Nurse who received alert:  A. Hope Budds, RN  MD notified (1st page):  Dr. Lamonte Sakai Time of first page:  0900  MD notified (2nd page):  Time of second page:  Responding MD:  Brock Ra  Time MD responded:  0900

## 2015-01-10 NOTE — Procedures (Signed)
Interventional Radiology Procedure Note  Procedure: Placement 53F LEFT perc nephrostomy tube.  Urine sample sent for cx.  Complications: None  Estimated Blood Loss:  0  Recommendations: - Tube to gravity drainage - Urine sent for Cx - INR came back elevated concerning for sepsis induced coagulopathy.  Sending type and screen now and will transfuse 1 unit FFP.  Discussed with Dr. Leonidas Romberg who agrees.    Signed,  Criselda Peaches, MD

## 2015-01-10 NOTE — Consult Note (Signed)
Chief Complaint: Chief Complaint  Patient presents with  . Altered Mental Status    Referring Physician(s): Merton Border, MD - PCCM  History of Present Illness: Becky Rogers is a 64 y.o. female who presented to the ER on 01/09/15 with septic shock and left sided abdominal pain.  CT evaluation shows an obstructing LEFT UPJ stone measuring 7 mm.  Findings concerning for obstructed pyonephrosis and urosepsis.    Additionally, there is evidence of 3rd spacing and possible early acute lung injury ARDS.  Urology has consulted and agrees that urgent percutaneous decompression is preferred over ureteral stent placement with additional risk of anesthesia.   Discussed with patient and her husband who is at the bedside. They understand the tube will be in place likely for several weeks.   Past Medical History  Diagnosis Date  . Hypertension   . Hypercholesteremia   . Parkinson disease     Past Surgical History  Procedure Laterality Date  . Cesarean section      x2  . Ovarian cyst removal      age 28  . Appendectomy    . Colonoscopy    . Dental surgery      implant  . Breast lumpectomy with radioactive seed localization Right 08/28/2014    Procedure: RIGHT BREAST LUMPECTOMY WITH RADIOACTIVE SEED LOCALIZATION;  Surgeon: [name], MD;  Location: [facility];  Service: General;  Laterality: Right;    Allergies: Review of patient's allergies indicates no known allergies.  Medications: Prior to Admission medications   Medication Sig Start Date End Date Taking? Authorizing Provider  amLODipine (NORVASC) 5 MG tablet Take 5 mg by mouth daily.   Yes Historical Provider, MD  aspirin 81 MG tablet Take 81 mg by mouth daily.   Yes Historical Provider, MD  atorvastatin (LIPITOR) 40 MG tablet Take 40 mg by mouth daily.   Yes Historical Provider, MD  CALCIUM-VITAMIN D PO Take 2 tablets by mouth daily.   Yes Historical Provider, MD  carbidopa-levodopa (SINEMET IR) 25-100  MG per tablet Take 2 tablets by mouth 3 (three) times daily.   Yes Historical Provider, MD  clonazePAM (KLONOPIN) 0.5 MG tablet Take 0.5 mg by mouth at bedtime.   Yes Historical Provider, MD  fish oil-omega-3 fatty acids 1000 MG capsule Take 3 g by mouth daily.   Yes Historical Provider, MD  Multiple Vitamins-Minerals (MULTIVITAMINS THER. W/MINERALS) TABS Take 1 tablet by mouth daily.   Yes Historical Provider, MD  TRIHEXYPHENIDYL HCL PO Take 1 tablet by mouth 2 (two) times daily.   Yes Historical Provider, MD  HYDROcodone-acetaminophen (NORCO) 5-325 MG per tablet Take 1-2 tablets by mouth every 6 (six) hours as needed for moderate pain or severe pain. Patient not taking: Reported on 01/09/2015 08/28/14   [name], MD     History reviewed. No pertinent family history.  History   Social History  . Marital Status: Married    Spouse Name: N/A  . Number of Children: N/A  . Years of Education: N/A   Social History Main Topics  . Smoking status: Never Smoker   . Smokeless tobacco: Not on file  . Alcohol Use: Yes     Comment: occasion  . Drug Use: No  . Sexual Activity: Not on file   Other Topics Concern  . None   Social History Narrative    Review of Systems: A 12 point ROS discussed and pertinent positives are indicated in the HPI above.  All other  systems are negative.  Review of Systems  Vital Signs: BP 95/50 mmHg  Pulse 96  Temp(Src) 99 F (37.2 C) (Oral)  Resp 26  Ht 5\' 2"  (1.575 m)  Wt 106 lb (48.081 kg)  BMI 19.38 kg/m2  SpO2 97%  Physical Exam  Constitutional: She is oriented to person, place, and time. She appears well-developed and well-nourished. She appears toxic. No distress.  HENT:  Head: Normocephalic and atraumatic.  Eyes: No scleral icterus.  Cardiovascular: Regular rhythm.  Tachycardia present.  Exam reveals no gallop and no friction rub.   No murmur heard. Abdominal: There is tenderness in the left lower quadrant.    Neurological: She is  alert and oriented to person, place, and time.  Skin: Skin is warm.  Psychiatric: She has a normal mood and affect. Her behavior is normal.  Nursing note and vitals reviewed.   Mallampati Score:  MD Evaluation Airway: WNL Heart: Other (comments) Heart  comments: sinus tachycardia Abdomen: WNL Chest/ Lungs: WNL ASA  Classification: 2 Mallampati/Airway Score: Two  Imaging: Ct Abdomen Pelvis Wo Contrast  01/09/2015   CLINICAL DATA:  64 year old female with weight loss and abdominal pain.  EXAM: CT ABDOMEN AND PELVIS WITHOUT CONTRAST  TECHNIQUE: Multidetector CT imaging of the abdomen and pelvis was performed following the standard protocol without IV contrast.  COMPARISON:  None.  FINDINGS: Evaluation of this exam is limited in the absence of intravenous contrast. Evaluation is also limited due to streak artifact caused by patient's arms.  Lower chest: Patchy bilateral lower lobe ground-glass airspace opacity as well as bilateral interstitial prominence may be related to congestive changes or pneumonia. Clinical correlation is recommended. There is coronary vascular calcification. The  Peritoneum: No intraperitoneal free air. There is diffuse stranding of the upper abdominal mesentery with small amount of subhepatic/pericholecystic fluid.  Liver: Unremarkable.Mild periportal edema.  Gallbladder: There is high density contained within the gallbladder likely representing sludge or small stones. There is small amount of pericholecystic fluid. Ultrasound is recommended for better evaluation of the gallbladder.  Pancreas: There is mild peripancreatic stranding. Correlation with pancreatic enzymes recommended to exclude pancreatitis.No ductal dilatation.  Spleen: Unremarkable.  Adrenals glands: Unremarkable.  Kidneys, ureters, urinary bladder: There is an 8 mm left UPJ stone with mild left hydronephrosis. Small nonobstructing left renal inferior pole calculus noted. The right kidney is unremarkable.  StopThe urinary bladder is grossly unremarkable.  Reproductive:  Unremarkable.  Bowel and appendix:Loose stool noted throughout the colon. No bowel obstruction. The appendix is not visualized with certainty. No inflammatory changes identified in the right lower quadrant.  Vascular/Lymphatic: Mild atherosclerotic calcification of the aorta.No lymphadenopathy.  Abdominal wall/Musculoskeletal: Degenerative changes of the spine.  IMPRESSION: An 8 mm left UPJ stone with mild left hydronephrosis.  Gallbladder sludge versus small stones. Correlation with ultrasound is recommended for better evaluation of the gallbladder.  Upper abdominal, peripancreatic and periportal fluid. Correlation with pancreatic enzymes as well as liver function tests recommended.  No evidence of bowel obstruction.  Bilateral lower lobe pulmonary opacities may represent congestive changes versus pneumonia. The constellation of findings may represent underlying sepsis. Correlation with clinical exam and lab values and cultures recommended.   Electronically Signed   By: Anner Crete M.D.   On: 01/09/2015 23:18   Dg Chest Port 1 View  01/10/2015   CLINICAL DATA:  64 year old female with central line placement.  EXAM: PORTABLE CHEST - 1 VIEW  COMPARISON:  Chest radiograph dated 01/09/2015.  FINDINGS: Left IJ central line with tip  or central SVC.  No pneumothorax.  There bilateral perihilar haziness concerning for a degree of congestion. No focal consolidation. No pleural effusion. The cardiac silhouette is within normal limits. The osseous structures are grossly unremarkable.  IMPRESSION: Left IJ central line the tip over central SVC.  No pneumothorax.  Bilateral perihilar haziness, increased from earlier study.   Electronically Signed   By: Anner Crete M.D.   On: 01/10/2015 00:52   Dg Chest Portable 1 View  01/09/2015   CLINICAL DATA:  Weakness and cyanosis.  EXAM: PORTABLE CHEST - 1 VIEW  COMPARISON:  05/08/2007  FINDINGS: A single AP  portable view of the chest demonstrates no focal airspace consolidation or alveolar edema. The lungs are grossly clear. There is no large effusion or pneumothorax. Cardiac and mediastinal contours appear unremarkable.  IMPRESSION: No active disease.   Electronically Signed   By: Andreas Newport M.D.   On: 01/09/2015 21:25    Labs:  CBC:  Recent Labs  08/28/14 1037 01/09/15 2100 01/09/15 2115  WBC  --  8.6  --   HGB 15.9* 14.0 14.6  HCT  --  40.9 43.0  PLT  --  98*  --     COAGS: No results for input(s): INR, APTT in the last 8760 hours.  BMP:  Recent Labs  08/27/14 1340 01/09/15 2100 01/09/15 2115  NA 140 139 138  K 4.2 3.3* 3.2*  CL 104 104 101  CO2 26 21*  --   GLUCOSE 88 92 88  BUN 10 38* 38*  CALCIUM 9.1 8.6*  --   CREATININE 0.67 2.90* 2.80*  GFRNONAA >90 16*  --   GFRAA >90 19*  --     LIVER FUNCTION TESTS:  Recent Labs  01/09/15 2100  BILITOT 1.3*  AST 42*  ALT 20  ALKPHOS 270*  PROT 5.4*  ALBUMIN 3.2*    TUMOR MARKERS: No results for input(s): AFPTM, CEA, CA199, CHROMGRNA in the last 8760 hours.  Assessment and Plan:  64 yo female with urosepsis on pressor support 2/2 obstructing LEFT UPJ stone.    1.) To IR for urgent placement of left percutaneous nephrostomy.  Thank you for this interesting consult.  I greatly enjoyed meeting Becky Rogers and look forward to participating in their care.  Signed: [place], [place] 01/10/2015, 1:04 AM

## 2015-01-10 NOTE — Progress Notes (Signed)
[facility] Progress Note Patient Name: Becky Rogers DOB: 18-Jul-1950 MRN: [id number]   Date of Service  01/10/2015  HPI/Events of Note  Multiple issues: 1. Pain s/p percutaneous nephrostomy and 2. Lactic Acid 5.5 >> 7.2.  eICU Interventions  Will order: 1. Fentanyl 25-100 mcg IV Q 2 hours PRN.  2. Await CVP. Futher fluid therapy based on CVP. 3. Continue to trend Lactic Acid.     Intervention Category Major Interventions: Sepsis - evaluation and management;Shock - evaluation and management Intermediate Interventions: Pain - evaluation and management  [name] 01/10/2015, 2:28 AM

## 2015-01-10 NOTE — Progress Notes (Signed)
[facility] Progress Note Patient Name: Becky Rogers DOB: Jul 10, 1951 MRN: [id number]   Date of Service  01/10/2015  HPI/Events of Note  G neg septic shock UO low On levo 16mcg - quad strength per RN, charted wrongly earlier CVP low  eICU Interventions  1L NS bolus Vaso gtt A line Get ABG     Intervention Category Major Interventions: Shock - evaluation and management;Sepsis - evaluation and management  ALVA,RAKESH V. 01/10/2015, 11:53 PM

## 2015-01-10 NOTE — Progress Notes (Signed)
CRITICAL VALUE ALERT  Critical value received:  Gram neg rod in anarobic bottle Date of notification:  01/10/15  Time of notification:  1152  Critical value read back: yes  Nurse who received alert:  Hope Budds MD notified (1st page): [place] Time of first page:  1152  MD notified (2nd page):  Time of second page:  Responding PF:[id number] Time MD responded: 1152

## 2015-01-10 NOTE — H&P (Addendum)
PULMONARY / CRITICAL CARE MEDICINE   Name: Becky Rogers MRN: [id number] DOB: 1950-09-25    ADMISSION DATE:  01/09/2015 CONSULTATION DATE:  01/09/2015  REFERRING MD :  EDP  CHIEF COMPLAINT:  Abd pain  INITIAL PRESENTATION: 64 year old female with parkinsons dz presented 6/30 with abdominal pain. In ED she was profoundly hypotensive which was not responsive to IVF resuscitation. Lactic acid elevation, started on pressors. PCCM to admit.   STUDIES:  6/30 CT abd > 35m L UPJ stone with mild dhydronephrosis, Gallbladder sludge vs small stones, peripancreatic fluid. Bilateral lower lobe pulmonary opacities.   SIGNIFICANT EVENTS: 6/30 admitted for presumed sepsis   HISTORY OF PRESENT ILLNESS:  64year old with recent diagnosis of [facility]'s [facility] of [facility] pain. 40 lb weight loss past 6 months. She was diagnosed with Parkinson's in 11/2014, and has been having trouble getting medications sorted out. Has been struggling with occasional constipation and abd pain, however, feels as though she has been healthy with minimal limitations. 6/29 PM she developed acute onset abdominal pain. Felt as though this may be a bd case of constipation so she waited on it. 6/30 She developed worsening abdominal pain, with malaise and fevers. She presented to ER for this reason. In ED she was noted to be pale and diaphoretic. FAST exam was negative. She was profoundly hypotensive, which was was not responsive to 3L IVF. She was started on norepinephrine and PCCM was called for admission.  PAST MEDICAL HISTORY :   has a past medical history of Hypertension; Hypercholesteremia; and Parkinson disease.  has past surgical history that includes Cesarean section; Ovarian cyst removal; Appendectomy; Colonoscopy; Dental surgery; and Breast lumpectomy with radioactive seed localization (Right, 08/28/2014). Prior to Admission medications   Medication Sig Start Date End Date Taking?  Authorizing Provider  amLODipine (NORVASC) 5 MG tablet Take 5 mg by mouth daily.   Yes Historical Provider, MD  aspirin 81 MG tablet Take 81 mg by mouth daily.   Yes Historical Provider, MD  atorvastatin (LIPITOR) 40 MG tablet Take 40 mg by mouth daily.   Yes Historical Provider, MD  CALCIUM-VITAMIN D PO Take 2 tablets by mouth daily.   Yes Historical Provider, MD  carbidopa-levodopa (SINEMET IR) 25-100 MG per tablet Take 2 tablets by mouth 3 (three) times daily.   Yes Historical Provider, MD  clonazePAM (KLONOPIN) 0.5 MG tablet Take 0.5 mg by mouth at bedtime.   Yes Historical Provider, MD  fish oil-omega-3 fatty acids 1000 MG capsule Take 3 g by mouth daily.   Yes Historical Provider, MD  Multiple Vitamins-Minerals (MULTIVITAMINS THER. W/MINERALS) TABS Take 1 tablet by mouth daily.   Yes Historical Provider, MD  TRIHEXYPHENIDYL HCL PO Take 1 tablet by mouth 2 (two) times daily.   Yes Historical Provider, MD  HYDROcodone-acetaminophen (NORCO) 5-325 MG per tablet Take 1-2 tablets by mouth every 6 (six) hours as needed for moderate pain or severe pain. Patient not taking: Reported on 01/09/2015 08/28/14   HFanny Skates MD   No Known Allergies  FAMILY HISTORY:  has no family status information on file.  SOCIAL HISTORY:  reports that she has never smoked. She does not have any smokeless tobacco history on file. She reports that she drinks alcohol. She reports that she does not use illicit drugs.  REVIEW OF SYSTEMS:  Bolds are positive  Constitutional: weight loss, gain, night sweats, Fevers, chills, fatigue .  HEENT: headaches, Sore throat, sneezing, nasal congestion, post  nasal drip, Difficulty swallowing, Tooth/dental problems, visual complaints visual changes, ear ache CV:  chest pain, radiates: ,Orthopnea, PND, swelling in lower extremities, dizziness, palpitations, syncope.  GI  heartburn, indigestion, abdominal pain, nausea, vomiting, diarrhea, change in bowel habits, loss of appetite,  bloody stools.  Resp: cough, productive: , hemoptysis, dyspnea, chest pain, pleuritic.  Skin: rash or itching or icterus GU: dysuria, change in color of urine, urgency or frequency. flank pain, hematuria  MS: joint pain or swelling. decreased range of motion  Psych: change in mood or affect. depression or anxiety.  Neuro: difficulty with speech, weakness, numbness, ataxia    SUBJECTIVE:   VITAL SIGNS: Temp:  [97.6 F (36.4 C)-100.3 F (37.9 C)] 98.1 F (36.7 C) (07/01 0740) Pulse Rate:  [45-111] 105 (07/01 0730) Resp:  [12-36] 15 (07/01 0730) BP: (56-166)/(25-135) 131/58 mmHg (07/01 0730) SpO2:  [92 %-100 %] 100 % (07/01 0730) FiO2 (%):  [0 %-3 %] 3 % (07/01 0145) Weight:  [48.081 kg (106 lb)-57.8 kg (127 lb 6.8 oz)] 57.8 kg (127 lb 6.8 oz) (07/01 0451) HEMODYNAMICS: CVP:  [2 mmHg-9 mmHg] 2 mmHg VENTILATOR SETTINGS: Vent Mode:  [-]  FiO2 (%):  [0 %-3 %] 3 % INTAKE / OUTPUT:  Intake/Output Summary (Last 24 hours) at 01/10/15 0814 Last data filed at 01/10/15 0730  Gross per 24 hour  Intake 3958.12 ml  Output    875 ml  Net 3083.12 ml    PHYSICAL EXAMINATION: General:  Female of normal body habitus in NAD Neuro:  Alert, oriented, mild delirium  HEENT:  /AT, no JVD noted, PERRL. Erythema to neck and face Cardiovascular:  RRR, no MRG Lungs:  Clear bilateral breath sounds Abdomen:  Soft, non-tender, non-distended Musculoskeletal:  No acute deformity or ROM limitaiton Skin:  Grossly intact, rash to neck as noted above  LABS:  CBC  Recent Labs Lab 01/09/15 2100 01/09/15 2115 01/10/15 0050  WBC 8.6  --  3.2*  HGB 14.0 14.6 14.4  HCT 40.9 43.0 41.9  PLT 98*  --  80*   Coag's  Recent Labs Lab 01/10/15 0050  INR 2.22*   BMET  Recent Labs Lab 01/09/15 2100 01/09/15 2115 01/10/15 0050  NA 139 138 138  K 3.3* 3.2* 3.0*  CL 104 101 108  CO2 21*  --  15*  BUN 38* 38* 38*  CREATININE 2.90* 2.80* 2.62*  GLUCOSE 92 88 150*   Electrolytes  Recent  Labs Lab 01/09/15 2100 01/10/15 0050  CALCIUM 8.6* 6.9*  MG  --  1.4*  PHOS  --  2.9   Sepsis Markers  Recent Labs Lab 01/09/15 2120 01/10/15 0050  LATICACIDVEN 5.55* 7.2*  PROCALCITON  --  95.51  81.80   ABG No results for input(s): PHART, PCO2ART, PO2ART in the last 168 hours. Liver Enzymes  Recent Labs Lab 01/09/15 2100 01/10/15 0050  AST 42* 49*  ALT 20 23  ALKPHOS 270* 154*  BILITOT 1.3* 1.1  ALBUMIN 3.2* 2.5*   Cardiac Enzymes No results for input(s): TROPONINI, PROBNP in the last 168 hours. Glucose  Recent Labs Lab 01/09/15 2058 01/10/15 0003  GLUCAP 90 117*    Imaging Ct Abdomen Pelvis Wo Contrast  01/09/2015   CLINICAL DATA:  64 year old female with weight loss and abdominal pain.  EXAM: CT ABDOMEN AND PELVIS WITHOUT CONTRAST  TECHNIQUE: Multidetector CT imaging of the abdomen and pelvis was performed following the standard protocol without IV contrast.  COMPARISON:  None.  FINDINGS: Evaluation of this exam is limited  in the absence of intravenous contrast. Evaluation is also limited due to streak artifact caused by patient's arms.  Lower chest: Patchy bilateral lower lobe ground-glass airspace opacity as well as bilateral interstitial prominence may be related to congestive changes or pneumonia. Clinical correlation is recommended. There is coronary vascular calcification. The  Peritoneum: No intraperitoneal free air. There is diffuse stranding of the upper abdominal mesentery with small amount of subhepatic/pericholecystic fluid.  Liver: Unremarkable.Mild periportal edema.  Gallbladder: There is high density contained within the gallbladder likely representing sludge or small stones. There is small amount of pericholecystic fluid. Ultrasound is recommended for better evaluation of the gallbladder.  Pancreas: There is mild peripancreatic stranding. Correlation with pancreatic enzymes recommended to exclude pancreatitis.No ductal dilatation.  Spleen:  Unremarkable.  Adrenals glands: Unremarkable.  Kidneys, ureters, urinary bladder: There is an 8 mm left UPJ stone with mild left hydronephrosis. Small nonobstructing left renal inferior pole calculus noted. The right kidney is unremarkable. StopThe urinary bladder is grossly unremarkable.  Reproductive:  Unremarkable.  Bowel and appendix:Loose stool noted throughout the colon. No bowel obstruction. The appendix is not visualized with certainty. No inflammatory changes identified in the right lower quadrant.  Vascular/Lymphatic: Mild atherosclerotic calcification of the aorta.No lymphadenopathy.  Abdominal wall/Musculoskeletal: Degenerative changes of the spine.  IMPRESSION: An 8 mm left UPJ stone with mild left hydronephrosis.  Gallbladder sludge versus small stones. Correlation with ultrasound is recommended for better evaluation of the gallbladder.  Upper abdominal, peripancreatic and periportal fluid. Correlation with pancreatic enzymes as well as liver function tests recommended.  No evidence of bowel obstruction.  Bilateral lower lobe pulmonary opacities may represent congestive changes versus pneumonia. The constellation of findings may represent underlying sepsis. Correlation with clinical exam and lab values and cultures recommended.   Electronically Signed   By: Anner Crete M.D.   On: 01/09/2015 23:18   Dg Chest Port 1 View  01/10/2015   CLINICAL DATA:  Hypertension.  Difficulty breathing  EXAM: PORTABLE CHEST - 1 VIEW  COMPARISON:  Study obtained earlier in the day and January 09, 2015  FINDINGS: Central catheter tip is in the superior vena cava. No pneumothorax. There is consolidation in the left base which was not present 1 day prior. There is subtle increased opacity in the medial right base, also new from 1 day prior. Elsewhere lungs are clear. Heart size and pulmonary vascularity are normal. No adenopathy. No bone lesions.  IMPRESSION: Airspace consolidation in the medial left base. More subtle  opacity in the medial right base, concerning for early pneumonia as well. Lungs elsewhere clear. No change in cardiac silhouette.   Electronically Signed   By: Lowella Grip III M.D.   On: 01/10/2015 07:58   Dg Chest Port 1 View  01/10/2015   CLINICAL DATA:  64 year old female with central line placement.  EXAM: PORTABLE CHEST - 1 VIEW  COMPARISON:  Chest radiograph dated 01/09/2015.  FINDINGS: Left IJ central line with tip or central SVC.  No pneumothorax.  There bilateral perihilar haziness concerning for a degree of congestion. No focal consolidation. No pleural effusion. The cardiac silhouette is within normal limits. The osseous structures are grossly unremarkable.  IMPRESSION: Left IJ central line the tip over central SVC.  No pneumothorax.  Bilateral perihilar haziness, increased from earlier study.   Electronically Signed   By: Anner Crete M.D.   On: 01/10/2015 00:52   Dg Chest Portable 1 View  01/09/2015   CLINICAL DATA:  Weakness and cyanosis.  EXAM:  PORTABLE CHEST - 1 VIEW  COMPARISON:  05/08/2007  FINDINGS: A single AP portable view of the chest demonstrates no focal airspace consolidation or alveolar edema. The lungs are grossly clear. There is no large effusion or pneumothorax. Cardiac and mediastinal contours appear unremarkable.  IMPRESSION: No active disease.   Electronically Signed   By: Andreas Newport M.D.   On: 01/09/2015 21:25   Ir Nephrostogram Left Initial Placement  01/10/2015   CLINICAL DATA:  65 year old female with obstructing left UVJ stone and urosepsis likely secondary to obstructed pyonephrosis. She requires urgent decompression of the left renal collecting system and presents for placement of a percutaneous nephrostomy tube.  EXAM: IR NEPHROSTOGRAM INI PLACEMENT LEFT  Date: 01/10/2015  PROCEDURE: 1. Ultrasound-guided puncture of a posterior interpolar calyx 2. Placement of a 10 French percutaneous nephrostomy tube under fluoroscopic guidance Interventional  Radiologist:  Criselda Peaches, MD  ANESTHESIA/SEDATION: 1 mg Versed said administered for anxiolysis. Additional sedation could not be administered secondary to significant hypotension despite pressor support.  MEDICATIONS: 400 mg ciprofloxacin administered intravenously just prior to patient arriving in interventional radiology  FLUOROSCOPY TIME:  1 minutes 36 seconds  CONTRAST:  60mL OMNIPAQUE IOHEXOL 300 MG/ML  SOLN  TECHNIQUE: Informed consent was obtained from the patient following explanation of the procedure, risks, benefits and alternatives. The patient understands, agrees and consents for the procedure. All questions were addressed. A time out was performed.  Maximal barrier sterile technique utilized including caps, mask, sterile gowns, sterile gloves, large sterile drape, hand hygiene, and Betadine skin prep. The left flank was interrogated with ultrasound. The kidney is easily identified and is mildly hydronephrotic. A suitable skin entry site was selected and marked. Local anesthesia was attained by infiltration with 1% lidocaine. A small dermatotomy was made. Under real-time sonographic guidance, a posterior calyx in the interpolar region was punctured using a 21 gauge Accustick needle. A gentle hand injection of contrast material under fluoroscopy confirmed access to the renal collecting system.  A 0.018 inch wire was advanced into the renal pelvis followed by the Accustick sheath. The 0.018 wire was then exchanged for a short 0.035 Amplatz wire. The tract was then dilated to 10 [place] and a Cook 10.2 [place] all-purpose drainage catheter advanced over the wire and formed within the renal pelvis. A small amount of urine was aspirated and sent for culture.  A gentle hand injection of contrast material confirmed location of the tube within the renal pelvis. There is complete obstruction at the UPJ secondary to the presence of the obstructing stone.  The tube was connected to gravity bag drainage and  secured to the skin with 0 Prolene suture and an adhesive fixation device.  COMPLICATIONS: None  IMPRESSION: Successful placement of a 10 French left-sided percutaneous nephrostomy tube.  A small aspirate of urine was sent for culture.  Signed,  Criselda Peaches, MD  [facility]  [facility]   Electronically Signed   By: Jacqulynn Cadet M.D.   On: 01/10/2015 07:43     ASSESSMENT / PLAN:  PULMONARY A: Bibasilar opacifications on CT concerning for early ALI  P:   Supplemental O2 CXR in AM Incentive spirometry  CARDIOVASCULAR CVL 6/30 >>> A:  Shock, likely septic in setting pyelo  P:  MAP goal > 2mm/Hg Volume resuscitation Insert CVL CVP monitoring goal 8-12 Norepinephrine for MAP goal Ensure lactic clearing  RENAL A:   L UPJ stone and hydronephrosis AKI Hypokalemia  P:   Replete K  Follow [facility] Urology consulted, recommends nephrostomy tube via IR.  GASTROINTESTINAL A:   ? Cholelithiasis Upper abdominal, peripancreatic and periportal fluid on CT  P:   NPO IV Protonix Amylase, lipase, alk phos, LFT Consider RUQ [place] to better evaluate gall bladder   HEMATOLOGIC A:   Thrombocytopenia  P:  Follow CBC Heparin SQ for VTE ppx  INFECTIOUS A:   Severe sepsis secondary to L pyelonephritis   P:   BCx2 6/30 >> UC 6/30 >> Vanc Zosyn in ED Abx: Ciprofloxacin 6/30 >>> PCT algorithm   ENDOCRINE A:     No acute issues  P:   Monitor glucose on Chem Add SSI if consecutive over 180 Check TSH, cortisol.   NEUROLOGIC A:   Acute metabolic encephalopathy  P:   RASS goal: 0 Monitor   FAMILY  - Updates:   - Inter-disciplinary family meet or Palliative Care meeting due by:  7/7   Georgann Housekeeper, AGACNP-BC [place] [facility] Pager [phone] or [phone]  01/10/2015 8:14 AM    PCCM ATTENDING: I have reviewed pt's initial presentation, consultants notes and hospital database in  detail.  The above assessment and plan was formulated under my direction.  In summary: Septic shock due to ureteral stone, hydro/pyelonephritis AKI AMS ALI Thrombocytopenia - likely sepsis related  Have discussed with Dr Tresa Moore who has recommended nephrostomy tube I have spoken with Dr Laurence Ferrari and requested perc neph tube placement We will treat with ciprofloxacin CVL to be placed for vasopressors High risk of intubation but doesn't need it presently SCDs for DVT prophylaxis SUP not absolutely indicated unless she gets intubated   40 minutes of independent CCM time was provided by me   Merton Border, MD;  PCCM service; Mobile [phone]

## 2015-01-10 NOTE — Progress Notes (Signed)
Referring Physician(s): CCM  Subjective:  Left pyonephrosis/stone L hydronephrosis L PCN placed early this am Feels better Output blood tinged   Allergies: Review of patient's allergies indicates no known allergies.  Medications: Prior to Admission medications   Medication Sig Start Date End Date Taking? Authorizing Provider  amLODipine (NORVASC) 5 MG tablet Take 5 mg by mouth daily.   Yes Historical Provider, MD  aspirin 81 MG tablet Take 81 mg by mouth daily.   Yes Historical Provider, MD  atorvastatin (LIPITOR) 40 MG tablet Take 40 mg by mouth daily.   Yes Historical Provider, MD  CALCIUM-VITAMIN D PO Take 2 tablets by mouth daily.   Yes Historical Provider, MD  carbidopa-levodopa (SINEMET IR) 25-100 MG per tablet Take 2 tablets by mouth 3 (three) times daily.   Yes Historical Provider, MD  clonazePAM (KLONOPIN) 0.5 MG tablet Take 0.5 mg by mouth at bedtime.   Yes Historical Provider, MD  fish oil-omega-3 fatty acids 1000 MG capsule Take 3 g by mouth daily.   Yes Historical Provider, MD  Multiple Vitamins-Minerals (MULTIVITAMINS THER. W/MINERALS) TABS Take 1 tablet by mouth daily.   Yes Historical Provider, MD  TRIHEXYPHENIDYL HCL PO Take 1 tablet by mouth 2 (two) times daily.   Yes Historical Provider, MD  HYDROcodone-acetaminophen (NORCO) 5-325 MG per tablet Take 1-2 tablets by mouth every 6 (six) hours as needed for moderate pain or severe pain. Patient not taking: Reported on 01/09/2015 08/28/14   [name], MD     Vital Signs: BP 98/55 mmHg  Pulse 104  Temp(Src) 98.3 F (36.8 C) (Oral)  Resp 26  Ht 5\' 2"  (1.575 m)  Wt 127 lb 6.8 oz (57.8 kg)  BMI 23.30 kg/m2  SpO2 98%  Physical Exam  Constitutional: She is oriented to person, place, and time.  Neurological: She is alert and oriented to person, place, and time.  Skin: Skin is warm.  L PCN site clean and dry No bleeding output 200 cc this am; 100 cc just now Cr sl better Afeb      Imaging: Ct  Abdomen Pelvis Wo Contrast  01/09/2015   CLINICAL DATA:  64 year old female with weight loss and abdominal pain.  EXAM: CT ABDOMEN AND PELVIS WITHOUT CONTRAST  TECHNIQUE: Multidetector CT imaging of the abdomen and pelvis was performed following the standard protocol without IV contrast.  COMPARISON:  None.  FINDINGS: Evaluation of this exam is limited in the absence of intravenous contrast. Evaluation is also limited due to streak artifact caused by patient's arms.  Lower chest: Patchy bilateral lower lobe ground-glass airspace opacity as well as bilateral interstitial prominence may be related to congestive changes or pneumonia. Clinical correlation is recommended. There is coronary vascular calcification. The  Peritoneum: No intraperitoneal free air. There is diffuse stranding of the upper abdominal mesentery with small amount of subhepatic/pericholecystic fluid.  Liver: Unremarkable.Mild periportal edema.  Gallbladder: There is high density contained within the gallbladder likely representing sludge or small stones. There is small amount of pericholecystic fluid. Ultrasound is recommended for better evaluation of the gallbladder.  Pancreas: There is mild peripancreatic stranding. Correlation with pancreatic enzymes recommended to exclude pancreatitis.No ductal dilatation.  Spleen: Unremarkable.  Adrenals glands: Unremarkable.  Kidneys, ureters, urinary bladder: There is an 8 mm left UPJ stone with mild left hydronephrosis. Small nonobstructing left renal inferior pole calculus noted. The right kidney is unremarkable. StopThe urinary bladder is grossly unremarkable.  Reproductive:  Unremarkable.  Bowel and appendix:Loose stool noted throughout the colon.  No bowel obstruction. The appendix is not visualized with certainty. No inflammatory changes identified in the right lower quadrant.  Vascular/Lymphatic: Mild atherosclerotic calcification of the aorta.No lymphadenopathy.  Abdominal wall/Musculoskeletal:  Degenerative changes of the spine.  IMPRESSION: An 8 mm left UPJ stone with mild left hydronephrosis.  Gallbladder sludge versus small stones. Correlation with ultrasound is recommended for better evaluation of the gallbladder.  Upper abdominal, peripancreatic and periportal fluid. Correlation with pancreatic enzymes as well as liver function tests recommended.  No evidence of bowel obstruction.  Bilateral lower lobe pulmonary opacities may represent congestive changes versus pneumonia. The constellation of findings may represent underlying sepsis. Correlation with clinical exam and lab values and cultures recommended.   Electronically Signed   By: Anner Crete M.D.   On: 01/09/2015 23:18   Dg Chest Port 1 View  01/10/2015   CLINICAL DATA:  Hypertension.  Difficulty breathing  EXAM: PORTABLE CHEST - 1 VIEW  COMPARISON:  Study obtained earlier in the day and January 09, 2015  FINDINGS: Central catheter tip is in the superior vena cava. No pneumothorax. There is consolidation in the left base which was not present 1 day prior. There is subtle increased opacity in the medial right base, also new from 1 day prior. Elsewhere lungs are clear. Heart size and pulmonary vascularity are normal. No adenopathy. No bone lesions.  IMPRESSION: Airspace consolidation in the medial left base. More subtle opacity in the medial right base, concerning for early pneumonia as well. Lungs elsewhere clear. No change in cardiac silhouette.   Electronically Signed   By: Lowella Grip III M.D.   On: 01/10/2015 07:58   Dg Chest Port 1 View  01/10/2015   CLINICAL DATA:  64 year old female with central line placement.  EXAM: PORTABLE CHEST - 1 VIEW  COMPARISON:  Chest radiograph dated 01/09/2015.  FINDINGS: Left IJ central line with tip or central SVC.  No pneumothorax.  There bilateral perihilar haziness concerning for a degree of congestion. No focal consolidation. No pleural effusion. The cardiac silhouette is within normal limits.  The osseous structures are grossly unremarkable.  IMPRESSION: Left IJ central line the tip over central SVC.  No pneumothorax.  Bilateral perihilar haziness, increased from earlier study.   Electronically Signed   By: Anner Crete M.D.   On: 01/10/2015 00:52   Dg Chest Portable 1 View  01/09/2015   CLINICAL DATA:  Weakness and cyanosis.  EXAM: PORTABLE CHEST - 1 VIEW  COMPARISON:  05/08/2007  FINDINGS: A single AP portable view of the chest demonstrates no focal airspace consolidation or alveolar edema. The lungs are grossly clear. There is no large effusion or pneumothorax. Cardiac and mediastinal contours appear unremarkable.  IMPRESSION: No active disease.   Electronically Signed   By: Andreas Newport M.D.   On: 01/09/2015 21:25   Ir Nephrostogram Left Initial Placement  01/10/2015   CLINICAL DATA:  64 year old female with obstructing left UVJ stone and urosepsis likely secondary to obstructed pyonephrosis. She requires urgent decompression of the left renal collecting system and presents for placement of a percutaneous nephrostomy tube.  EXAM: IR NEPHROSTOGRAM INI PLACEMENT LEFT  Date: 01/10/2015  PROCEDURE: 1. Ultrasound-guided puncture of a posterior interpolar calyx 2. Placement of a 10 French percutaneous nephrostomy tube under fluoroscopic guidance Interventional Radiologist:  Criselda Peaches, MD  ANESTHESIA/SEDATION: 1 mg Versed said administered for anxiolysis. Additional sedation could not be administered secondary to significant hypotension despite pressor support.  MEDICATIONS: 400 mg ciprofloxacin administered intravenously just prior  to patient arriving in interventional radiology  FLUOROSCOPY TIME:  1 minutes 36 seconds  CONTRAST:  31mL OMNIPAQUE IOHEXOL 300 MG/ML  SOLN  TECHNIQUE: Informed consent was obtained from the patient following explanation of the procedure, risks, benefits and alternatives. The patient understands, agrees and consents for the procedure. All questions were  addressed. A time out was performed.  Maximal barrier sterile technique utilized including caps, mask, sterile gowns, sterile gloves, large sterile drape, hand hygiene, and Betadine skin prep. The left flank was interrogated with ultrasound. The kidney is easily identified and is mildly hydronephrotic. A suitable skin entry site was selected and marked. Local anesthesia was attained by infiltration with 1% lidocaine. A small dermatotomy was made. Under real-time sonographic guidance, a posterior calyx in the interpolar region was punctured using a 21 gauge Accustick needle. A gentle hand injection of contrast material under fluoroscopy confirmed access to the renal collecting system.  A 0.018 inch wire was advanced into the renal pelvis followed by the Accustick sheath. The 0.018 wire was then exchanged for a short 0.035 Amplatz wire. The tract was then dilated to 10 [place] and a Cook 10.2 [place] all-purpose drainage catheter advanced over the wire and formed within the renal pelvis. A small amount of urine was aspirated and sent for culture.  A gentle hand injection of contrast material confirmed location of the tube within the renal pelvis. There is complete obstruction at the UPJ secondary to the presence of the obstructing stone.  The tube was connected to gravity bag drainage and secured to the skin with 0 Prolene suture and an adhesive fixation device.  COMPLICATIONS: None  IMPRESSION: Successful placement of a 10 French left-sided percutaneous nephrostomy tube.  A small aspirate of urine was sent for culture.  Signed,  Criselda Peaches, MD  [facility]  [facility]   Electronically Signed   By: Jacqulynn Cadet M.D.   On: 01/10/2015 07:43    Labs:  CBC:  Recent Labs  08/28/14 1037 01/09/15 2100 01/09/15 2115 01/10/15 0050  WBC  --  8.6  --  3.2*  HGB 15.9* 14.0 14.6 14.4  HCT  --  40.9 43.0 41.9  PLT  --  98*  --  80*    COAGS:  Recent  Labs  01/10/15 0050  INR 2.22*    BMP:  Recent Labs  08/27/14 1340 01/09/15 2100 01/09/15 2115 01/10/15 0050  NA 140 139 138 138  K 4.2 3.3* 3.2* 3.0*  CL 104 104 101 108  CO2 26 21*  --  15*  GLUCOSE 88 92 88 150*  BUN 10 38* 38* 38*  CALCIUM 9.1 8.6*  --  6.9*  CREATININE 0.67 2.90* 2.80* 2.62*  GFRNONAA >90 16*  --  18*  GFRAA >90 19*  --  21*    LIVER FUNCTION TESTS:  Recent Labs  01/09/15 2100 01/10/15 0050  BILITOT 1.3* 1.1  AST 42* 49*  ALT 20 23  ALKPHOS 270* 154*  PROT 5.4* 4.4*  ALBUMIN 3.2* 2.5*    Assessment and Plan:  L PCN placed 7/1 am Hydro/pyonephrosis Will follow Plan per CCM   Signed: Ravon Mortellaro A 01/10/2015, 2:42 PM   I spent a total of 15 Minutes in face to face in clinical consultation/evaluation, greater than 50% of which was counseling/coordinating care for L PCN

## 2015-01-10 NOTE — Progress Notes (Signed)
CRITICAL VALUE ALERT  Critical value received: lactic acid 7.2  Date of notification:  01/10/15  Time of notification:  2:18  Critical value read back:Yes.    Nurse who received alert:  Norva Pavlov, RN  MD notified (1st page):  Dr. Oletta Darter, E-link  Time of first page:  2:26  MD notified (2nd page):  Time of second page:  Responding MD:  Dr. Oletta Darter, E-link  Time MD responded: 2:26

## 2015-01-10 NOTE — Consult Note (Signed)
Reason for Consult: Left Ureteral Stone, Urosepsis, Acute Renal Failure  Referring Physician: [facility]  Becky Rogers is an 64 y.o. female.   HPI:   1- Left Ureteral Stone - 7.68m left prox ureteral stone with mild-mod hydro and ipsilateral approx 348mlower pole stone by ER CT 01/09/15 on eval left abdominal pain and septic shock. No prior nephrolithiasis. No contralateral stones.  2 - Urosepsis - fevers, tachycardia, hypotension on ER presentation c/w sepsis. Pressurses stabilized after 3L IVF and modest dose pressors, some interstitial lung changes on Ct c/w likel eardly ARDS. UCX, BCX pending. Received one time Zosyn + Vanc in ER and now on empiric Cipro.   3 -  Acute Renal Failure - Cr 2.9 on ER presentation up from baseline <1. Admits to some poor PO intake and now recent hypotention, left hydro. No right hydro or obvious renal atrophy.   PMH sig for mild parksinsons (sinemet), Appy, breast BX, HTN. No CV dissease or blood thinners.  Today LiJoyas seen as emergent consult for above, specifically for further management of left ureteral stone in setting of sepsis and acute renal failure.   Past Medical History  Diagnosis Date  . Hypertension   . Hypercholesteremia   . Parkinson disease     Past Surgical History  Procedure Laterality Date  . Cesarean section      x2  . Ovarian cyst removal      age 64. Appendectomy    . Colonoscopy    . Dental surgery      implant  . Breast lumpectomy with radioactive seed localization Right 08/28/2014    Procedure: RIGHT BREAST LUMPECTOMY WITH RADIOACTIVE SEED LOCALIZATION;  Surgeon: HaFanny SkatesMD;  [place] [facility] [place] [facility]: [facility];  Laterality: Right;    History reviewed. No pertinent family history.  Social History:  reports that she has never smoked. She does not have any smokeless tobacco history on file. She reports that she drinks alcohol. She reports that she does not use illicit  drugs.  Allergies: No Known Allergies  Medications: I have reviewed the patient's current medications.  Results for orders placed or performed during the hospital encounter of 01/09/15 (from the past 48 hour(s))  CBG monitoring, ED     Status: None   Collection Time: 01/09/15  8:58 PM  Result Value Ref Range   Glucose-Capillary 90 65 - 99 mg/dL  CBC     Status: Abnormal   Collection Time: 01/09/15  9:00 PM  Result Value Ref Range   WBC 8.6 4.0 - 10.5 K/uL   RBC 4.70 3.87 - 5.11 MIL/uL   Hemoglobin 14.0 12.0 - 15.0 g/dL   HCT 40.9 36.0 - 46.0 %   MCV 87.0 78.0 - 100.0 fL   MCH 29.8 26.0 - 34.0 pg   MCHC 34.2 30.0 - 36.0 g/dL   RDW 12.7 11.5 - 15.5 %   Platelets 98 (L) 150 - 400 K/uL    Comment: PLATELET COUNT CONFIRMED BY SMEAR REPEATED TO VERIFY SPECIMEN CHECKED FOR CLOTS   Comprehensive metabolic panel     Status: Abnormal   Collection Time: 01/09/15  9:00 PM  Result Value Ref Range   Sodium 139 135 - 145 mmol/L   Potassium 3.3 (L) 3.5 - 5.1 mmol/L   Chloride 104 101 - 111 mmol/L   CO2 21 (L) 22 - 32 mmol/L   Glucose, Bld 92 65 - 99 mg/dL   BUN 38 (H) 6 -  20 mg/dL   Creatinine, Ser 2.90 (H) 0.44 - 1.00 mg/dL   Calcium 8.6 (L) 8.9 - 10.3 mg/dL   Total Protein 5.4 (L) 6.5 - 8.1 g/dL   Albumin 3.2 (L) 3.5 - 5.0 g/dL   AST 42 (H) 15 - 41 U/L   ALT 20 14 - 54 U/L   Alkaline Phosphatase 270 (H) 38 - 126 U/L   Total Bilirubin 1.3 (H) 0.3 - 1.2 mg/dL   GFR calc non Af Amer 16 (L) >60 mL/min   GFR calc Af Amer 19 (L) >60 mL/min    Comment: (NOTE) The eGFR has been calculated using the CKD EPI equation. This calculation has not been validated in all clinical situations. eGFR's persistently <60 mL/min signify possible Chronic Kidney Disease.    Anion gap 14 5 - 15  I-Stat Chem 8, ED     Status: Abnormal   Collection Time: 01/09/15  9:15 PM  Result Value Ref Range   Sodium 138 135 - 145 mmol/L   Potassium 3.2 (L) 3.5 - 5.1 mmol/L   Chloride 101 101 - 111 mmol/L   BUN  38 (H) 6 - 20 mg/dL   Creatinine, Ser 2.80 (H) 0.44 - 1.00 mg/dL   Glucose, Bld 88 65 - 99 mg/dL   Calcium, Ion 1.10 (L) 1.13 - 1.30 mmol/L   TCO2 19 0 - 100 mmol/L   Hemoglobin 14.6 12.0 - 15.0 g/dL   HCT 43.0 36.0 - 46.0 %  I-Stat CG4 Lactic Acid, ED  (not at  [facility])     Status: Abnormal   Collection Time: 01/09/15  9:20 PM  Result Value Ref Range   Lactic Acid, Venous 5.55 (HH) 0.5 - 2.0 mmol/L   Comment NOTIFIED PHYSICIAN   I-Stat Troponin, ED (not at [facility])     Status: None   Collection Time: 01/09/15  9:22 PM  Result Value Ref Range   Troponin i, poc 0.01 0.00 - 0.08 ng/mL   Comment 3            Comment: Due to the release kinetics of cTnI, a negative result within the first hours of the onset of symptoms does not rule out myocardial infarction with certainty. If myocardial infarction is still suspected, repeat the test at appropriate intervals.   Glucose, capillary     Status: Abnormal   Collection Time: 01/10/15 12:03 AM  Result Value Ref Range   Glucose-Capillary 117 (H) 65 - 99 mg/dL    Ct Abdomen Pelvis Wo Contrast  01/09/2015   CLINICAL DATA:  64 year old female with weight loss and abdominal pain.  EXAM: CT ABDOMEN AND PELVIS WITHOUT CONTRAST  TECHNIQUE: Multidetector CT imaging of the abdomen and pelvis was performed following the standard protocol without IV contrast.  COMPARISON:  None.  FINDINGS: Evaluation of this exam is limited in the absence of intravenous contrast. Evaluation is also limited due to streak artifact caused by patient's arms.  Lower chest: Patchy bilateral lower lobe ground-glass airspace opacity as well as bilateral interstitial prominence may be related to congestive changes or pneumonia. Clinical correlation is recommended. There is coronary vascular calcification. The  Peritoneum: No intraperitoneal free air. There is diffuse stranding of the upper abdominal mesentery with small amount of subhepatic/pericholecystic fluid.  Liver: Unremarkable.Mild  periportal edema.  Gallbladder: There is high density contained within the gallbladder likely representing sludge or small stones. There is small amount of pericholecystic fluid. Ultrasound is recommended for better evaluation of the gallbladder.  Pancreas: There is mild  peripancreatic stranding. Correlation with pancreatic enzymes recommended to exclude pancreatitis.No ductal dilatation.  Spleen: Unremarkable.  Adrenals glands: Unremarkable.  Kidneys, ureters, urinary bladder: There is an 8 mm left UPJ stone with mild left hydronephrosis. Small nonobstructing left renal inferior pole calculus noted. The right kidney is unremarkable. StopThe urinary bladder is grossly unremarkable.  Reproductive:  Unremarkable.  Bowel and appendix:Loose stool noted throughout the colon. No bowel obstruction. The appendix is not visualized with certainty. No inflammatory changes identified in the right lower quadrant.  Vascular/Lymphatic: Mild atherosclerotic calcification of the aorta.No lymphadenopathy.  Abdominal wall/Musculoskeletal: Degenerative changes of the spine.  IMPRESSION: An 8 mm left UPJ stone with mild left hydronephrosis.  Gallbladder sludge versus small stones. Correlation with ultrasound is recommended for better evaluation of the gallbladder.  Upper abdominal, peripancreatic and periportal fluid. Correlation with pancreatic enzymes as well as liver function tests recommended.  No evidence of bowel obstruction.  Bilateral lower lobe pulmonary opacities may represent congestive changes versus pneumonia. The constellation of findings may represent underlying sepsis. Correlation with clinical exam and lab values and cultures recommended.   Electronically Signed   By: Anner Crete M.D.   On: 01/09/2015 23:18   Dg Chest Portable 1 View  01/09/2015   CLINICAL DATA:  Weakness and cyanosis.  EXAM: PORTABLE CHEST - 1 VIEW  COMPARISON:  05/08/2007  FINDINGS: A single AP portable view of the chest demonstrates no focal  airspace consolidation or alveolar edema. The lungs are grossly clear. There is no large effusion or pneumothorax. Cardiac and mediastinal contours appear unremarkable.  IMPRESSION: No active disease.   Electronically Signed   By: Andreas Newport M.D.   On: 01/09/2015 21:25    Review of Systems  Constitutional: Positive for fever, chills and malaise/fatigue.  HENT: Negative.   Eyes: Negative.   Respiratory: Negative.   Cardiovascular: Negative.   Gastrointestinal: Negative.   Genitourinary: Positive for dysuria, urgency, frequency and flank pain.  Musculoskeletal: Negative.   Skin: Negative.   Neurological: Negative.   Endo/Heme/Allergies: Negative.   Psychiatric/Behavioral: Negative.    Blood pressure 96/44, pulse 92, temperature 99 F (37.2 C), temperature source Oral, resp. rate 21, height 5' 2"  (1.575 m), weight 53 kg (116 lb 13.5 oz), SpO2 95 %. Physical Exam  Constitutional: She appears well-developed.  Shaking chills  HENT:  Head: Normocephalic.  Eyes: Pupils are equal, round, and reactive to light.  Neck: Normal range of motion.  Cardiovascular:  Regular tachycardia  Respiratory: Effort normal.  Mild tachypnea, no wheezes  GI: Soft.  Genitourinary:  Mild left CVAT. 74ffoley placed to straight drain 10cc sterile water in balloon with concentrated appearing urine efflux.   Musculoskeletal: Normal range of motion.  Neurological: She is alert.  Skin: Skin is warm.  Psychiatric: She has a normal mood and affect. Her behavior is normal. Judgment and thought content normal.    Assessment/Plan:  1- Left Ureteral Stone - discussed with pt goal of urgent renal decompression, treat infections, and then definitive stone manatment (likely ureteroscopy) in elective setting after clears infection. Modes of renal decompression including JJ stetn v. neph tube discussed. Given early ARDS changes I am concerned that intubation / general anesthesia would be risky from pulmonary  perspective and therefore favor urgent neph tube. Pt amenable. Frankly discussed that she would have externalized drain likely for several weeks.   2 - Urosepsis - Agree with current ABX pending further CX data. Given poor GFR recent zosyn and vanc dose should provide addition broad  spectrum coverage for short term in addition to fluoroquinolone. Certainly would consider scheduled rocephin or aztreonam in addition to current Cipro if not improving clinically in case Cipro resistance.   3 -  Acute Renal Failure - likely multifactorial with left renal obstruction but also profound hypoperfusion state. GFR may worsen some before improvement. Agree with IVF and renal-perfusion promoting BP support. Catheter placed for accurate UOP monitoring.   4 - will follow.    Hensley Aziz 01/10/2015, 12:15 AM

## 2015-01-10 NOTE — Progress Notes (Signed)
CRITICAL VALUE ALERT  Critical value received:  Positive fecal occult  Date of notification:  5:53  Time of notification:  Was not contacted from lab  Critical value read back:No.  Nurse who received alert:  Norva Pavlov, RN  MD notified (1st page):  E-link RN Lexine Baton to notify E-link MD  Time of first page:  5:52  MD notified (2nd page):  Time of second page:  Responding MD:  E-link  Time MD responded:  5:52

## 2015-01-10 NOTE — Progress Notes (Signed)
Called phlebotomy to verify that the received the sample for the cortisol, said that they can run it with samples they received.

## 2015-01-10 NOTE — Progress Notes (Signed)
[facility] Progress Note Patient Name: Becky Rogers DOB: May 18, 1951 MRN: [id number]   Date of Service  01/10/2015  HPI/Events of Note  Multiple issues: 1. Pain and 2. Nausea.   eICU Interventions  Will order: 1. Increase Fentanyl IV to Q 1 hour PRN.  2. Zofran IV PRN nausea.       Intervention Category Intermediate Interventions: Pain - evaluation and management Minor Interventions: Routine modifications to care plan (e.g. PRN medications for pain, fever)  Sommer,Steven Eugene 01/10/2015, 3:08 AM

## 2015-01-10 NOTE — Consult Note (Signed)
PULMONARY / CRITICAL CARE MEDICINE   Name: Becky Rogers MRN: [id number] DOB: 1950-09-25    ADMISSION DATE:  01/09/2015 CONSULTATION DATE:  01/09/2015  REFERRING MD :  EDP  CHIEF COMPLAINT:  Abd pain  INITIAL PRESENTATION: 64 year old female with parkinsons dz presented 6/30 with abdominal pain. In ED she was profoundly hypotensive which was not responsive to IVF resuscitation. Lactic acid elevation, started on pressors. PCCM to admit.   STUDIES:  6/30 CT abd > 35m L UPJ stone with mild dhydronephrosis, Gallbladder sludge vs small stones, peripancreatic fluid. Bilateral lower lobe pulmonary opacities.   SIGNIFICANT EVENTS: 6/30 admitted for presumed sepsis   HISTORY OF PRESENT ILLNESS:  64year old with recent diagnosis of [facility]'s disease [facility] of [facility] pain. 40 lb weight loss past 6 months. She was diagnosed with Parkinson's in 11/2014, and has been having trouble getting medications sorted out. Has been struggling with occasional constipation and abd pain, however, feels as though she has been healthy with minimal limitations. 6/29 PM she developed acute onset abdominal pain. Felt as though this may be a bd case of constipation so she waited on it. 6/30 She developed worsening abdominal pain, with malaise and fevers. She presented to ER for this reason. In ED she was noted to be pale and diaphoretic. FAST exam was negative. She was profoundly hypotensive, which was was not responsive to 3L IVF. She was started on norepinephrine and PCCM was called for admission.  PAST MEDICAL HISTORY :   has a past medical history of Hypertension; Hypercholesteremia; and Parkinson disease.  has past surgical history that includes Cesarean section; Ovarian cyst removal; Appendectomy; Colonoscopy; Dental surgery; and Breast lumpectomy with radioactive seed localization (Right, 08/28/2014). Prior to Admission medications   Medication Sig Start Date End Date Taking?  Authorizing Provider  amLODipine (NORVASC) 5 MG tablet Take 5 mg by mouth daily.   Yes Historical Provider, MD  aspirin 81 MG tablet Take 81 mg by mouth daily.   Yes Historical Provider, MD  atorvastatin (LIPITOR) 40 MG tablet Take 40 mg by mouth daily.   Yes Historical Provider, MD  CALCIUM-VITAMIN D PO Take 2 tablets by mouth daily.   Yes Historical Provider, MD  carbidopa-levodopa (SINEMET IR) 25-100 MG per tablet Take 2 tablets by mouth 3 (three) times daily.   Yes Historical Provider, MD  clonazePAM (KLONOPIN) 0.5 MG tablet Take 0.5 mg by mouth at bedtime.   Yes Historical Provider, MD  fish oil-omega-3 fatty acids 1000 MG capsule Take 3 g by mouth daily.   Yes Historical Provider, MD  Multiple Vitamins-Minerals (MULTIVITAMINS THER. W/MINERALS) TABS Take 1 tablet by mouth daily.   Yes Historical Provider, MD  TRIHEXYPHENIDYL HCL PO Take 1 tablet by mouth 2 (two) times daily.   Yes Historical Provider, MD  HYDROcodone-acetaminophen (NORCO) 5-325 MG per tablet Take 1-2 tablets by mouth every 6 (six) hours as needed for moderate pain or severe pain. Patient not taking: Reported on 01/09/2015 08/28/14   HFanny Skates MD   No Known Allergies  FAMILY HISTORY:  has no family status information on file.  SOCIAL HISTORY:  reports that she has never smoked. She does not have any smokeless tobacco history on file. She reports that she drinks alcohol. She reports that she does not use illicit drugs.  REVIEW OF SYSTEMS:  Bolds are positive  Constitutional: weight loss, gain, night sweats, Fevers, chills, fatigue .  HEENT: headaches, Sore throat, sneezing, nasal congestion, post  nasal drip, Difficulty swallowing, Tooth/dental problems, visual complaints visual changes, ear ache CV:  chest pain, radiates: ,Orthopnea, PND, swelling in lower extremities, dizziness, palpitations, syncope.  GI  heartburn, indigestion, abdominal pain, nausea, vomiting, diarrhea, change in bowel habits, loss of appetite,  bloody stools.  Resp: cough, productive: , hemoptysis, dyspnea, chest pain, pleuritic.  Skin: rash or itching or icterus GU: dysuria, change in color of urine, urgency or frequency. flank pain, hematuria  MS: joint pain or swelling. decreased range of motion  Psych: change in mood or affect. depression or anxiety.  Neuro: difficulty with speech, weakness, numbness, ataxia    SUBJECTIVE:   VITAL SIGNS: Temp:  [97.6 F (36.4 C)-100.3 F (37.9 C)] 98.1 F (36.7 C) (07/01 0740) Pulse Rate:  [45-111] 105 (07/01 0730) Resp:  [12-36] 15 (07/01 0730) BP: (56-166)/(25-135) 131/58 mmHg (07/01 0730) SpO2:  [92 %-100 %] 100 % (07/01 0730) FiO2 (%):  [0 %-3 %] 3 % (07/01 0145) Weight:  [48.081 kg (106 lb)-57.8 kg (127 lb 6.8 oz)] 57.8 kg (127 lb 6.8 oz) (07/01 0451) HEMODYNAMICS: CVP:  [2 mmHg-9 mmHg] 2 mmHg VENTILATOR SETTINGS: Vent Mode:  [-]  FiO2 (%):  [0 %-3 %] 3 % INTAKE / OUTPUT:  Intake/Output Summary (Last 24 hours) at 01/10/15 0813 Last data filed at 01/10/15 0730  Gross per 24 hour  Intake 3958.12 ml  Output    875 ml  Net 3083.12 ml    PHYSICAL EXAMINATION: General:  Female of normal body habitus in NAD Neuro:  Alert, oriented, mild delirium  HEENT:  [place]/AT, no JVD noted, PERRL. Erythema to neck and face Cardiovascular:  RRR, no MRG Lungs:  Clear bilateral breath sounds Abdomen:  Soft, non-tender, non-distended Musculoskeletal:  No acute deformity or ROM limitaiton Skin:  Grossly intact, rash to neck as noted above  LABS:  CBC  Recent Labs Lab 01/09/15 2100 01/09/15 2115 01/10/15 0050  WBC 8.6  --  3.2*  HGB 14.0 14.6 14.4  HCT 40.9 43.0 41.9  PLT 98*  --  80*   Coag's  Recent Labs Lab 01/10/15 0050  INR 2.22*   BMET  Recent Labs Lab 01/09/15 2100 01/09/15 2115 01/10/15 0050  NA 139 138 138  K 3.3* 3.2* 3.0*  CL 104 101 108  CO2 21*  --  15*  BUN 38* 38* 38*  CREATININE 2.90* 2.80* 2.62*  GLUCOSE 92 88 150*   Electrolytes  Recent  Labs Lab 01/09/15 2100 01/10/15 0050  CALCIUM 8.6* 6.9*  MG  --  1.4*  PHOS  --  2.9   Sepsis Markers  Recent Labs Lab 01/09/15 2120 01/10/15 0050  LATICACIDVEN 5.55* 7.2*  PROCALCITON  --  95.51  81.80   ABG No results for input(s): PHART, PCO2ART, PO2ART in the last 168 hours. Liver Enzymes  Recent Labs Lab 01/09/15 2100 01/10/15 0050  AST 42* 49*  ALT 20 23  ALKPHOS 270* 154*  BILITOT 1.3* 1.1  ALBUMIN 3.2* 2.5*   Cardiac Enzymes No results for input(s): TROPONINI, PROBNP in the last 168 hours. Glucose  Recent Labs Lab 01/09/15 2058 01/10/15 0003  GLUCAP 90 117*    Imaging Ct Abdomen Pelvis Wo Contrast  01/09/2015   CLINICAL DATA:  64 year old female with weight loss and abdominal pain.  EXAM: CT ABDOMEN AND PELVIS WITHOUT CONTRAST  TECHNIQUE: Multidetector CT imaging of the abdomen and pelvis was performed following the standard protocol without IV contrast.  COMPARISON:  None.  FINDINGS: Evaluation of this exam is limited  in the absence of intravenous contrast. Evaluation is also limited due to streak artifact caused by patient's arms.  Lower chest: Patchy bilateral lower lobe ground-glass airspace opacity as well as bilateral interstitial prominence may be related to congestive changes or pneumonia. Clinical correlation is recommended. There is coronary vascular calcification. The  Peritoneum: No intraperitoneal free air. There is diffuse stranding of the upper abdominal mesentery with small amount of subhepatic/pericholecystic fluid.  Liver: Unremarkable.Mild periportal edema.  Gallbladder: There is high density contained within the gallbladder likely representing sludge or small stones. There is small amount of pericholecystic fluid. Ultrasound is recommended for better evaluation of the gallbladder.  Pancreas: There is mild peripancreatic stranding. Correlation with pancreatic enzymes recommended to exclude pancreatitis.No ductal dilatation.  Spleen:  Unremarkable.  Adrenals glands: Unremarkable.  Kidneys, ureters, urinary bladder: There is an 8 mm left UPJ stone with mild left hydronephrosis. Small nonobstructing left renal inferior pole calculus noted. The right kidney is unremarkable. StopThe urinary bladder is grossly unremarkable.  Reproductive:  Unremarkable.  Bowel and appendix:Loose stool noted throughout the colon. No bowel obstruction. The appendix is not visualized with certainty. No inflammatory changes identified in the right lower quadrant.  Vascular/Lymphatic: Mild atherosclerotic calcification of the aorta.No lymphadenopathy.  Abdominal wall/Musculoskeletal: Degenerative changes of the spine.  IMPRESSION: An 8 mm left UPJ stone with mild left hydronephrosis.  Gallbladder sludge versus small stones. Correlation with ultrasound is recommended for better evaluation of the gallbladder.  Upper abdominal, peripancreatic and periportal fluid. Correlation with pancreatic enzymes as well as liver function tests recommended.  No evidence of bowel obstruction.  Bilateral lower lobe pulmonary opacities may represent congestive changes versus pneumonia. The constellation of findings may represent underlying sepsis. Correlation with clinical exam and lab values and cultures recommended.   Electronically Signed   By: Anner Crete M.D.   On: 01/09/2015 23:18   Dg Chest Port 1 View  01/10/2015   CLINICAL DATA:  Hypertension.  Difficulty breathing  EXAM: PORTABLE CHEST - 1 VIEW  COMPARISON:  Study obtained earlier in the day and January 09, 2015  FINDINGS: Central catheter tip is in the superior vena cava. No pneumothorax. There is consolidation in the left base which was not present 1 day prior. There is subtle increased opacity in the medial right base, also new from 1 day prior. Elsewhere lungs are clear. Heart size and pulmonary vascularity are normal. No adenopathy. No bone lesions.  IMPRESSION: Airspace consolidation in the medial left base. More subtle  opacity in the medial right base, concerning for early pneumonia as well. Lungs elsewhere clear. No change in cardiac silhouette.   Electronically Signed   By: Lowella Grip III M.D.   On: 01/10/2015 07:58   Dg Chest Port 1 View  01/10/2015   CLINICAL DATA:  64 year old female with central line placement.  EXAM: PORTABLE CHEST - 1 VIEW  COMPARISON:  Chest radiograph dated 01/09/2015.  FINDINGS: Left IJ central line with tip or central SVC.  No pneumothorax.  There bilateral perihilar haziness concerning for a degree of congestion. No focal consolidation. No pleural effusion. The cardiac silhouette is within normal limits. The osseous structures are grossly unremarkable.  IMPRESSION: Left IJ central line the tip over central SVC.  No pneumothorax.  Bilateral perihilar haziness, increased from earlier study.   Electronically Signed   By: Anner Crete M.D.   On: 01/10/2015 00:52   Dg Chest Portable 1 View  01/09/2015   CLINICAL DATA:  Weakness and cyanosis.  EXAM:  PORTABLE CHEST - 1 VIEW  COMPARISON:  05/08/2007  FINDINGS: A single AP portable view of the chest demonstrates no focal airspace consolidation or alveolar edema. The lungs are grossly clear. There is no large effusion or pneumothorax. Cardiac and mediastinal contours appear unremarkable.  IMPRESSION: No active disease.   Electronically Signed   By: Andreas Newport M.D.   On: 01/09/2015 21:25   Ir Nephrostogram Left Initial Placement  01/10/2015   CLINICAL DATA:  64 year old female with obstructing left UVJ stone and urosepsis likely secondary to obstructed pyonephrosis. She requires urgent decompression of the left renal collecting system and presents for placement of a percutaneous nephrostomy tube.  EXAM: IR NEPHROSTOGRAM INI PLACEMENT LEFT  Date: 01/10/2015  PROCEDURE: 1. Ultrasound-guided puncture of a posterior interpolar calyx 2. Placement of a 10 French percutaneous nephrostomy tube under fluoroscopic guidance Interventional  Radiologist:  Criselda Peaches, MD  ANESTHESIA/SEDATION: 1 mg Versed said administered for anxiolysis. Additional sedation could not be administered secondary to significant hypotension despite pressor support.  MEDICATIONS: 400 mg ciprofloxacin administered intravenously just prior to patient arriving in interventional radiology  FLUOROSCOPY TIME:  1 minutes 36 seconds  CONTRAST:  9mL OMNIPAQUE IOHEXOL 300 MG/ML  SOLN  TECHNIQUE: Informed consent was obtained from the patient following explanation of the procedure, risks, benefits and alternatives. The patient understands, agrees and consents for the procedure. All questions were addressed. A time out was performed.  Maximal barrier sterile technique utilized including caps, mask, sterile gowns, sterile gloves, large sterile drape, hand hygiene, and Betadine skin prep. The left flank was interrogated with ultrasound. The kidney is easily identified and is mildly hydronephrotic. A suitable skin entry site was selected and marked. Local anesthesia was attained by infiltration with 1% lidocaine. A small dermatotomy was made. Under real-time sonographic guidance, a posterior calyx in the interpolar region was punctured using a 21 gauge Accustick needle. A gentle hand injection of contrast material under fluoroscopy confirmed access to the renal collecting system.  A 0.018 inch wire was advanced into the renal pelvis followed by the Accustick sheath. The 0.018 wire was then exchanged for a short 0.035 Amplatz wire. The tract was then dilated to 10 [place] and a Cook 10.2 [place] all-purpose drainage catheter advanced over the wire and formed within the renal pelvis. A small amount of urine was aspirated and sent for culture.  A gentle hand injection of contrast material confirmed location of the tube within the renal pelvis. There is complete obstruction at the UPJ secondary to the presence of the obstructing stone.  The tube was connected to gravity bag drainage and  secured to the skin with 0 Prolene suture and an adhesive fixation device.  COMPLICATIONS: None  IMPRESSION: Successful placement of a 10 French left-sided percutaneous nephrostomy tube.  A small aspirate of urine was sent for culture.  Signed,  Criselda Peaches, MD  [facility]  [facility]   Electronically Signed   By: Jacqulynn Cadet M.D.   On: 01/10/2015 07:43     ASSESSMENT / PLAN:  PULMONARY A: Bibasilar opacifications on CT concerning for early ALI  P:   Supplemental O2 CXR in AM Incentive spirometry  CARDIOVASCULAR CVL 6/30 >>> A:  Shock, likely septic in setting pyelo  P:  MAP goal > 60mm/Hg Volume resuscitation Insert CVL CVP monitoring goal 8-12 Norepinephrine for MAP goal Ensure lactic clearing  RENAL A:   L UPJ stone and hydronephrosis AKI Hypokalemia  P:   Replete K  Follow [facility] Urology consulted, recommends nephrostomy tube via IR.  GASTROINTESTINAL A:   ? Cholelithiasis Upper abdominal, peripancreatic and periportal fluid on CT  P:   NPO IV Protonix Amylase, lipase, alk phos, LFT Consider RUQ [place] to better evaluate gall bladder   HEMATOLOGIC A:   Thrombocytopenia  P:  Follow CBC Heparin SQ for VTE ppx  INFECTIOUS A:   Severe sepsis secondary to L pyelonephritis   P:   BCx2 6/30 >> UC 6/30 >> Vanc Zosyn in ED Abx: Ciprofloxacin 6/30 >>> PCT algorithm   ENDOCRINE A:     No acute issues  P:   Monitor glucose on Chem Add SSI if consecutive over 180 Check TSH, cortisol.   NEUROLOGIC A:   Acute metabolic encephalopathy  P:   RASS goal: 0 Monitor   FAMILY  - Updates:   - Inter-disciplinary family meet or Palliative Care meeting due by:  7/7   Georgann Housekeeper, AGACNP-BC [place] [facility] Pager [phone] or [phone]  01/10/2015 8:13 AM    PCCM ATTENDING: I have reviewed pt's initial presentation, consultants notes and hospital database in  detail.  The above assessment and plan was formulated under my direction.  In summary: Septic shock due to ureteral stone, hydro/pyelonephritis AKI AMS ALI Thrombocytopenia - likely sepsis related  Have discussed with Dr Tresa Moore who has recommended nephrostomy tube I have spoken with Dr Laurence Ferrari and requested perc neph tube placement We will treat with ciprofloxacin CVL to be placed for vasopressors High risk of intubation but doesn't need it presently SCDs for DVT prophylaxis SUP not absolutely indicated unless she gets intubated   40 minutes of independent CCM time was provided by me   Merton Border, MD;  PCCM service; Mobile [phone]

## 2015-01-10 NOTE — Sedation Documentation (Signed)
Moving to bed

## 2015-01-10 NOTE — ED Provider Notes (Signed)
CSN: [id number]     Arrival date & time 01/09/15  2051 History   First MD Initiated Contact with Patient 01/09/15 2100     Chief Complaint  Patient presents with  . Altered Mental Status     (Consider location/radiation/quality/duration/timing/severity/associated sxs/prior Treatment) Patient is a 64 y.o. female presenting with altered mental status.  Altered Mental Status Presenting symptoms: lethargy   Severity:  Moderate Most recent episode:  Today Episode history:  Single Duration: Several hours. Timing:  Constant Progression:  Worsening Chronicity:  New Context comment:  Husband reports that the patient had been complaining of left-sided abdominal pain since yesterday. He also reported a fever of 102 prior to arrival Associated symptoms: abdominal pain, decreased appetite and fever   Associated symptoms: normal movement, no bladder incontinence, no difficulty breathing, no headaches, no light-headedness, no nausea, no palpitations, no rash, no slurred speech, no visual change, no vomiting and no weakness     Past Medical History  Diagnosis Date  . Hypertension   . Hypercholesteremia   . Parkinson disease    Past Surgical History  Procedure Laterality Date  . Cesarean section      x2  . Ovarian cyst removal      age 66  . Appendectomy    . Colonoscopy    . Dental surgery      implant  . Breast lumpectomy with radioactive seed localization Right 08/28/2014    Procedure: RIGHT BREAST LUMPECTOMY WITH RADIOACTIVE SEED LOCALIZATION;  Surgeon: [name], MD;  Location: [facility];  Service: General;  Laterality: Right;   History reviewed. No pertinent family history. History  Substance Use Topics  . Smoking status: Never Smoker   . Smokeless tobacco: Not on file  . Alcohol Use: Yes     Comment: occasion   OB History    No data available     Review of Systems  Constitutional: Positive for fever and decreased appetite. Negative for chills,  appetite change and fatigue.  HENT: Negative for congestion, ear pain, facial swelling, mouth sores and sore throat.   Eyes: Negative for visual disturbance.  Respiratory: Negative for cough, chest tightness and shortness of breath.   Cardiovascular: Negative for chest pain and palpitations.  Gastrointestinal: Positive for abdominal pain. Negative for nausea, vomiting, diarrhea and blood in stool.  Endocrine: Negative for cold intolerance and heat intolerance.  Genitourinary: Negative for bladder incontinence, frequency, decreased urine volume and difficulty urinating.  Musculoskeletal: Negative for back pain and neck stiffness.  Skin: Negative for rash.  Neurological: Negative for dizziness, weakness, light-headedness and headaches.  All other systems reviewed and are negative.     Allergies  Review of patient's allergies indicates no known allergies.  Home Medications   Prior to Admission medications   Medication Sig Start Date End Date Taking? Authorizing Provider  amLODipine (NORVASC) 5 MG tablet Take 5 mg by mouth daily.   Yes Historical Provider, MD  aspirin 81 MG tablet Take 81 mg by mouth daily.   Yes Historical Provider, MD  atorvastatin (LIPITOR) 40 MG tablet Take 40 mg by mouth daily.   Yes Historical Provider, MD  CALCIUM-VITAMIN D PO Take 2 tablets by mouth daily.   Yes Historical Provider, MD  carbidopa-levodopa (SINEMET IR) 25-100 MG per tablet Take 2 tablets by mouth 3 (three) times daily.   Yes Historical Provider, MD  clonazePAM (KLONOPIN) 0.5 MG tablet Take 0.5 mg by mouth at bedtime.   Yes Historical Provider, MD  fish oil-omega-3 fatty  acids 1000 MG capsule Take 3 g by mouth daily.   Yes Historical Provider, MD  Multiple Vitamins-Minerals (MULTIVITAMINS THER. W/MINERALS) TABS Take 1 tablet by mouth daily.   Yes Historical Provider, MD  TRIHEXYPHENIDYL HCL PO Take 1 tablet by mouth 2 (two) times daily.   Yes Historical Provider, MD  HYDROcodone-acetaminophen  (NORCO) 5-325 MG per tablet Take 1-2 tablets by mouth every 6 (six) hours as needed for moderate pain or severe pain. Patient not taking: Reported on 01/09/2015 08/28/14   [name], MD   BP 100/80 mmHg  Pulse 98  Temp(Src) 99 F (37.2 C) (Oral)  Resp 25  Ht 5\' 2"  (1.575 m)  Wt 106 lb (48.081 kg)  BMI 19.38 kg/m2  SpO2 99% Physical Exam  Constitutional: She is oriented to person, place, and time. She appears well-developed and well-nourished. No distress.  HENT:  Head: Normocephalic and atraumatic.  Right Ear: External ear normal.  Left Ear: External ear normal.  Nose: Nose normal.  Eyes: Conjunctivae and EOM are normal. Pupils are equal, round, and reactive to light. Right eye exhibits no discharge. Left eye exhibits no discharge. No scleral icterus.  Neck: Normal range of motion. Neck supple.  Cardiovascular: Normal rate, regular rhythm and normal heart sounds.  Exam reveals no gallop and no friction rub.   No murmur heard. Pulmonary/Chest: Effort normal and breath sounds normal. No stridor. No respiratory distress. She has no wheezes.  Abdominal: Soft. She exhibits no distension. There is no tenderness.  Musculoskeletal: She exhibits no edema or tenderness.  Neurological: She is alert and oriented to person, place, and time.  Skin: Skin is dry. No rash noted. She is not diaphoretic. No erythema.  Skin cold and mottled  Psychiatric: She has a normal mood and affect.    ED Course  Procedures (including critical care time)  [facility] BEDSIDE US Indication: Hypotension  9 Views obtained: Splenorenal, Morrison's Pouch, Retrovesical, Pericardial, IVC, and Lung fields No free fluid in abdomen, IVC full No pericardial effusion; however poor contractility Lung sliding in all fields No difficulty obtaining views. Archived electronically I personally performed and interrepreted the images  Labs Review Labs Reviewed  CBC - Abnormal; Notable for the following:    Platelets 98 (*)     All other components within normal limits  COMPREHENSIVE METABOLIC PANEL - Abnormal; Notable for the following:    Potassium 3.3 (*)    CO2 21 (*)    BUN 38 (*)    Creatinine, Ser 2.90 (*)    Calcium 8.6 (*)    Total Protein 5.4 (*)    Albumin 3.2 (*)    AST 42 (*)    Alkaline Phosphatase 270 (*)    Total Bilirubin 1.3 (*)    GFR calc non Af Amer 16 (*)    GFR calc Af Amer 19 (*)    All other components within normal limits  URINALYSIS, ROUTINE W REFLEX MICROSCOPIC (NOT AT [facility]) - Abnormal; Notable for the following:    Color, Urine RED (*)    APPearance TURBID (*)    Specific Gravity, Urine <1.005 (*)    Glucose, UA 250 (*)    Hgb urine dipstick LARGE (*)    Ketones, ur 15 (*)    Protein, ur >300 (*)    Urobilinogen, UA 4.0 (*)    Nitrite POSITIVE (*)    Leukocytes, UA LARGE (*)    All other components within normal limits  LACTIC ACID, PLASMA - Abnormal; Notable for the  following:    Lactic Acid, Venous 7.2 (*)    All other components within normal limits  HEPATIC FUNCTION PANEL - Abnormal; Notable for the following:    Total Protein 4.4 (*)    Albumin 2.5 (*)    AST 49 (*)    Alkaline Phosphatase 154 (*)    All other components within normal limits  PROTIME-INR - Abnormal; Notable for the following:    Prothrombin Time 24.4 (*)    INR 2.22 (*)    All other components within normal limits  GLUCOSE, CAPILLARY - Abnormal; Notable for the following:    Glucose-Capillary 117 (*)    All other components within normal limits  URINE MICROSCOPIC-ADD ON - Abnormal; Notable for the following:    Bacteria, UA MANY (*)    All other components within normal limits  I-STAT CHEM 8, ED - Abnormal; Notable for the following:    Potassium 3.2 (*)    BUN 38 (*)    Creatinine, Ser 2.80 (*)    Calcium, Ion 1.10 (*)    All other components within normal limits  I-STAT CG4 LACTIC ACID, ED - Abnormal; Notable for the following:    Lactic Acid, Venous 5.55 (*)    All other  components within normal limits  CULTURE, BLOOD (ROUTINE X 2)  CULTURE, BLOOD (ROUTINE X 2)  URINE CULTURE  MRSA PCR SCREENING  URINE CULTURE  CORTISOL  CBC  BASIC METABOLIC PANEL  MAGNESIUM  PHOSPHORUS  PROCALCITONIN  PROCALCITONIN  OCCULT BLOOD X 1 CARD TO LAB, STOOL  CORTISOL  I-STAT TROPOININ, ED  CBG MONITORING, ED  TYPE AND SCREEN  PREPARE FRESH FROZEN PLASMA    Imaging Review Ct Abdomen Pelvis Wo Contrast  01/09/2015   CLINICAL DATA:  64 year old female with weight loss and abdominal pain.  EXAM: CT ABDOMEN AND PELVIS WITHOUT CONTRAST  TECHNIQUE: Multidetector CT imaging of the abdomen and pelvis was performed following the standard protocol without IV contrast.  COMPARISON:  None.  FINDINGS: Evaluation of this exam is limited in the absence of intravenous contrast. Evaluation is also limited due to streak artifact caused by patient's arms.  Lower chest: Patchy bilateral lower lobe ground-glass airspace opacity as well as bilateral interstitial prominence may be related to congestive changes or pneumonia. Clinical correlation is recommended. There is coronary vascular calcification. The  Peritoneum: No intraperitoneal free air. There is diffuse stranding of the upper abdominal mesentery with small amount of subhepatic/pericholecystic fluid.  Liver: Unremarkable.Mild periportal edema.  Gallbladder: There is high density contained within the gallbladder likely representing sludge or small stones. There is small amount of pericholecystic fluid. Ultrasound is recommended for better evaluation of the gallbladder.  Pancreas: There is mild peripancreatic stranding. Correlation with pancreatic enzymes recommended to exclude pancreatitis.No ductal dilatation.  Spleen: Unremarkable.  Adrenals glands: Unremarkable.  Kidneys, ureters, urinary bladder: There is an 8 mm left UPJ stone with mild left hydronephrosis. Small nonobstructing left renal inferior pole calculus noted. The right kidney is  unremarkable. StopThe urinary bladder is grossly unremarkable.  Reproductive:  Unremarkable.  Bowel and appendix:Loose stool noted throughout the colon. No bowel obstruction. The appendix is not visualized with certainty. No inflammatory changes identified in the right lower quadrant.  Vascular/Lymphatic: Mild atherosclerotic calcification of the aorta.No lymphadenopathy.  Abdominal wall/Musculoskeletal: Degenerative changes of the spine.  IMPRESSION: An 8 mm left UPJ stone with mild left hydronephrosis.  Gallbladder sludge versus small stones. Correlation with ultrasound is recommended for better evaluation of the gallbladder.  Upper  abdominal, peripancreatic and periportal fluid. Correlation with pancreatic enzymes as well as liver function tests recommended.  No evidence of bowel obstruction.  Bilateral lower lobe pulmonary opacities may represent congestive changes versus pneumonia. The constellation of findings may represent underlying sepsis. Correlation with clinical exam and lab values and cultures recommended.   Electronically Signed   By: Anner Crete M.D.   On: 01/09/2015 23:18   Dg Chest Port 1 View  01/10/2015   CLINICAL DATA:  64 year old female with central line placement.  EXAM: PORTABLE CHEST - 1 VIEW  COMPARISON:  Chest radiograph dated 01/09/2015.  FINDINGS: Left IJ central line with tip or central SVC.  No pneumothorax.  There bilateral perihilar haziness concerning for a degree of congestion. No focal consolidation. No pleural effusion. The cardiac silhouette is within normal limits. The osseous structures are grossly unremarkable.  IMPRESSION: Left IJ central line the tip over central SVC.  No pneumothorax.  Bilateral perihilar haziness, increased from earlier study.   Electronically Signed   By: Anner Crete M.D.   On: 01/10/2015 00:52   Dg Chest Portable 1 View  01/09/2015   CLINICAL DATA:  Weakness and cyanosis.  EXAM: PORTABLE CHEST - 1 VIEW  COMPARISON:  05/08/2007  FINDINGS:  A single AP portable view of the chest demonstrates no focal airspace consolidation or alveolar edema. The lungs are grossly clear. There is no large effusion or pneumothorax. Cardiac and mediastinal contours appear unremarkable.  IMPRESSION: No active disease.   Electronically Signed   By: Andreas Newport M.D.   On: 01/09/2015 21:25     EKG Interpretation None      MDM   64 year old female presents with hypotension with initial systolics in the 10F. Patient given 2 L bolus and blood pressures improved. History and exam as above most consistent with likely septic shock. Patient was started on empiric vancomycin and Zosyn. EKG with normal sinus rhythm. No evidence of acute ischemia, arrhythmia, or blocks. Troponin negative. Chest x-ray without evidence of pneumonia, effusion, or edema. Elevated lactic acid at 5.55. Labs with acute renal failure.  On reassessment patient's blood pressures dropped again. She was given additional fluids to which she was nonresponsive to. She was started on Levophed to maintain maps greater than 65.  Critical care team was consulted for admission. Patient was sent to obtain CT abdomen and pelvis without contrast given her new onset renal failure. CT revealed a millimeter UVJ stone. Urinalysis without evidence of urinary tract infection. Urosepsis consistent with the patient's symptoms and presentation. Patient will be admitted to intensive care unit for further management.  Patient was seen in conjunction with Dr. Vanita Panda.   Final diagnoses:  Abdominal pain, lower  Pyonephrosis  Encounter for central line placement        Addison Lank, MD 01/10/15 [place], MD 01/10/15 1918  Carmin Muskrat, MD 01/16/15 [phone]

## 2015-01-10 NOTE — Progress Notes (Signed)
PULMONARY / CRITICAL CARE MEDICINE   Name: Becky Rogers MRN: [id number] DOB: 1950/10/07    ADMISSION DATE:  01/09/2015 CONSULTATION DATE:  01/09/2015  REFERRING MD :  EDP  CHIEF COMPLAINT:  Abd pain  INITIAL PRESENTATION: 64 year old female with parkinsons dz presented 6/30 with abdominal pain. In ED she was profoundly hypotensive which was not responsive to IVF resuscitation. Lactic acid elevation, started on pressors. PCCM to admit.   STUDIES:  6/30 CT abd > 17mm L UPJ stone with mild dhydronephrosis, Gallbladder sludge vs small stones, peripancreatic fluid. Bilateral lower lobe pulmonary opacities.   SIGNIFICANT EVENTS: 6/30 admitted for presumed sepsis 6/30 left perc nephrostomy tube placed. GNR growing in urine.     VITAL SIGNS: Temp:  [97.6 F (36.4 C)-100.3 F (37.9 C)] 98.3 F (36.8 C) (07/01 1123) Pulse Rate:  [45-111] 103 (07/01 1130) Resp:  [12-36] 25 (07/01 1130) BP: (56-166)/(25-135) 95/46 mmHg (07/01 1130) SpO2:  [92 %-100 %] 97 % (07/01 1130) FiO2 (%):  [0 %-3 %] 3 % (07/01 0145) Weight:  [48.081 kg (106 lb)-57.8 kg (127 lb 6.8 oz)] 57.8 kg (127 lb 6.8 oz) (07/01 0451) HEMODYNAMICS: CVP:  [2 mmHg-9 mmHg] 3 mmHg VENTILATOR SETTINGS: Vent Mode:  [-]  FiO2 (%):  [0 %-3 %] 3 % INTAKE / OUTPUT:  Intake/Output Summary (Last 24 hours) at 01/10/15 1212 Last data filed at 01/10/15 1100  Gross per 24 hour  Intake 4024.92 ml  Output    950 ml  Net 3074.92 ml    PHYSICAL EXAMINATION: General:  Female of normal body habitus in NAD Neuro:  Alert, oriented, cooperative HEENT:  [place]/AT, no JVD noted, PERRL. Erythema to neck and face Cardiovascular:  RRR, no MRG Lungs:  Clear bilateral breath sounds Abdomen:  Soft, non-tender, non-distended Musculoskeletal:  No acute deformity or ROM limitaiton Skin:  Grossly intact, mottled   LABS:  CBC  Recent Labs Lab 01/09/15 2100 01/09/15 2115 01/10/15 0050  WBC 8.6  --  3.2*  HGB 14.0 14.6 14.4  HCT 40.9 43.0  41.9  PLT 98*  --  80*   Coag's  Recent Labs Lab 01/10/15 0050  INR 2.22*   BMET  Recent Labs Lab 01/09/15 2100 01/09/15 2115 01/10/15 0050  NA 139 138 138  K 3.3* 3.2* 3.0*  CL 104 101 108  CO2 21*  --  15*  BUN 38* 38* 38*  CREATININE 2.90* 2.80* 2.62*  GLUCOSE 92 88 150*   Electrolytes  Recent Labs Lab 01/09/15 2100 01/10/15 0050  CALCIUM 8.6* 6.9*  MG  --  1.4*  PHOS  --  2.9   Sepsis Markers  Recent Labs Lab 01/09/15 2120 01/10/15 0050 01/10/15 0738  LATICACIDVEN 5.55* 7.2* 4.1*  PROCALCITON  --  95.51  81.80  --    ABG No results for input(s): PHART, PCO2ART, PO2ART in the last 168 hours. Liver Enzymes  Recent Labs Lab 01/09/15 2100 01/10/15 0050  AST 42* 49*  ALT 20 23  ALKPHOS 270* 154*  BILITOT 1.3* 1.1  ALBUMIN 3.2* 2.5*   Cardiac Enzymes No results for input(s): TROPONINI, PROBNP in the last 168 hours. Glucose  Recent Labs Lab 01/09/15 2058 01/10/15 0003  GLUCAP 90 117*    Imaging Ct Abdomen Pelvis Wo Contrast  01/09/2015   CLINICAL DATA:  64 year old female with weight loss and abdominal pain.  EXAM: CT ABDOMEN AND PELVIS WITHOUT CONTRAST  TECHNIQUE: Multidetector CT imaging of the abdomen and pelvis was performed following the standard protocol  without IV contrast.  COMPARISON:  None.  FINDINGS: Evaluation of this exam is limited in the absence of intravenous contrast. Evaluation is also limited due to streak artifact caused by patient's arms.  Lower chest: Patchy bilateral lower lobe ground-glass airspace opacity as well as bilateral interstitial prominence may be related to congestive changes or pneumonia. Clinical correlation is recommended. There is coronary vascular calcification. The  Peritoneum: No intraperitoneal free air. There is diffuse stranding of the upper abdominal mesentery with small amount of subhepatic/pericholecystic fluid.  Liver: Unremarkable.Mild periportal edema.  Gallbladder: There is high density contained  within the gallbladder likely representing sludge or small stones. There is small amount of pericholecystic fluid. Ultrasound is recommended for better evaluation of the gallbladder.  Pancreas: There is mild peripancreatic stranding. Correlation with pancreatic enzymes recommended to exclude pancreatitis.No ductal dilatation.  Spleen: Unremarkable.  Adrenals glands: Unremarkable.  Kidneys, ureters, urinary bladder: There is an 8 mm left UPJ stone with mild left hydronephrosis. Small nonobstructing left renal inferior pole calculus noted. The right kidney is unremarkable. StopThe urinary bladder is grossly unremarkable.  Reproductive:  Unremarkable.  Bowel and appendix:Loose stool noted throughout the colon. No bowel obstruction. The appendix is not visualized with certainty. No inflammatory changes identified in the right lower quadrant.  Vascular/Lymphatic: Mild atherosclerotic calcification of the aorta.No lymphadenopathy.  Abdominal wall/Musculoskeletal: Degenerative changes of the spine.  IMPRESSION: An 8 mm left UPJ stone with mild left hydronephrosis.  Gallbladder sludge versus small stones. Correlation with ultrasound is recommended for better evaluation of the gallbladder.  Upper abdominal, peripancreatic and periportal fluid. Correlation with pancreatic enzymes as well as liver function tests recommended.  No evidence of bowel obstruction.  Bilateral lower lobe pulmonary opacities may represent congestive changes versus pneumonia. The constellation of findings may represent underlying sepsis. Correlation with clinical exam and lab values and cultures recommended.   Electronically Signed   By: Anner Crete M.D.   On: 01/09/2015 23:18   Dg Chest Port 1 View  01/10/2015   CLINICAL DATA:  Hypertension.  Difficulty breathing  EXAM: PORTABLE CHEST - 1 VIEW  COMPARISON:  Study obtained earlier in the day and January 09, 2015  FINDINGS: Central catheter tip is in the superior vena cava. No pneumothorax. There is  consolidation in the left base which was not present 1 day prior. There is subtle increased opacity in the medial right base, also new from 1 day prior. Elsewhere lungs are clear. Heart size and pulmonary vascularity are normal. No adenopathy. No bone lesions.  IMPRESSION: Airspace consolidation in the medial left base. More subtle opacity in the medial right base, concerning for early pneumonia as well. Lungs elsewhere clear. No change in cardiac silhouette.   Electronically Signed   By: Lowella Grip III M.D.   On: 01/10/2015 07:58   Dg Chest Port 1 View  01/10/2015   CLINICAL DATA:  64 year old female with central line placement.  EXAM: PORTABLE CHEST - 1 VIEW  COMPARISON:  Chest radiograph dated 01/09/2015.  FINDINGS: Left IJ central line with tip or central SVC.  No pneumothorax.  There bilateral perihilar haziness concerning for a degree of congestion. No focal consolidation. No pleural effusion. The cardiac silhouette is within normal limits. The osseous structures are grossly unremarkable.  IMPRESSION: Left IJ central line the tip over central SVC.  No pneumothorax.  Bilateral perihilar haziness, increased from earlier study.   Electronically Signed   By: Anner Crete M.D.   On: 01/10/2015 00:52   Dg Chest  Portable 1 View  01/09/2015   CLINICAL DATA:  Weakness and cyanosis.  EXAM: PORTABLE CHEST - 1 VIEW  COMPARISON:  05/08/2007  FINDINGS: A single AP portable view of the chest demonstrates no focal airspace consolidation or alveolar edema. The lungs are grossly clear. There is no large effusion or pneumothorax. Cardiac and mediastinal contours appear unremarkable.  IMPRESSION: No active disease.   Electronically Signed   By: Andreas Newport M.D.   On: 01/09/2015 21:25   Ir Nephrostogram Left Initial Placement  01/10/2015   CLINICAL DATA:  64 year old female with obstructing left UVJ stone and urosepsis likely secondary to obstructed pyonephrosis. She requires urgent decompression of the  left renal collecting system and presents for placement of a percutaneous nephrostomy tube.  EXAM: IR NEPHROSTOGRAM INI PLACEMENT LEFT  Date: 01/10/2015  PROCEDURE: 1. Ultrasound-guided puncture of a posterior interpolar calyx 2. Placement of a 10 French percutaneous nephrostomy tube under fluoroscopic guidance Interventional Radiologist:  Criselda Peaches, MD  ANESTHESIA/SEDATION: 1 mg Versed said administered for anxiolysis. Additional sedation could not be administered secondary to significant hypotension despite pressor support.  MEDICATIONS: 400 mg ciprofloxacin administered intravenously just prior to patient arriving in interventional radiology  FLUOROSCOPY TIME:  1 minutes 36 seconds  CONTRAST:  22mL OMNIPAQUE IOHEXOL 300 MG/ML  SOLN  TECHNIQUE: Informed consent was obtained from the patient following explanation of the procedure, risks, benefits and alternatives. The patient understands, agrees and consents for the procedure. All questions were addressed. A time out was performed.  Maximal barrier sterile technique utilized including caps, mask, sterile gowns, sterile gloves, large sterile drape, hand hygiene, and Betadine skin prep. The left flank was interrogated with ultrasound. The kidney is easily identified and is mildly hydronephrotic. A suitable skin entry site was selected and marked. Local anesthesia was attained by infiltration with 1% lidocaine. A small dermatotomy was made. Under real-time sonographic guidance, a posterior calyx in the interpolar region was punctured using a 21 gauge Accustick needle. A gentle hand injection of contrast material under fluoroscopy confirmed access to the renal collecting system.  A 0.018 inch wire was advanced into the renal pelvis followed by the Accustick sheath. The 0.018 wire was then exchanged for a short 0.035 Amplatz wire. The tract was then dilated to 10 [place] and a Cook 10.2 [place] all-purpose drainage catheter advanced over the wire and formed within  the renal pelvis. A small amount of urine was aspirated and sent for culture.  A gentle hand injection of contrast material confirmed location of the tube within the renal pelvis. There is complete obstruction at the UPJ secondary to the presence of the obstructing stone.  The tube was connected to gravity bag drainage and secured to the skin with 0 Prolene suture and an adhesive fixation device.  COMPLICATIONS: None  IMPRESSION: Successful placement of a 10 French left-sided percutaneous nephrostomy tube.  A small aspirate of urine was sent for culture.  Signed,  Criselda Peaches, MD  [facility]  [facility]   Electronically Signed   By: Jacqulynn Cadet M.D.   On: 01/10/2015 07:43     ASSESSMENT / PLAN:  PULMONARY A: Bibasilar opacifications on CT concerning for early ALI  P:   Supplemental O2 CXR in AM Incentive spirometry  CARDIOVASCULAR CVL 6/30 >>> A:  Septic Shock, likely septic in setting pyelo Lactate clearing  P:  MAP goal > 29mm/Hg Volume resuscitation CVP monitoring goal 8-12 Ck cortisol Norepinephrine for MAP goal   RENAL  A:   L UPJ stone and hydronephrosis, s/p Left Perc nephrostomy tube 7/1 AKI/ ATN  AGMA/lactic acidosis  Hypokalemia Hypomagnesemia  P:   1 liter LR  Renal dose meds Replete K and Mg Follow Bmet   GASTROINTESTINAL A:   ? Cholelithiasis Upper abdominal, peripancreatic and periportal fluid on CT >LFTs trending down  P:   NPO-->cl liquids  IV Protonix F/u LFTs intermittently   HEMATOLOGIC A:   Thrombocytopenia  P:  Follow CBC Heparin SQ for VTE ppx  INFECTIOUS A:   Severe sepsis secondary to L pyelonephritis   P:   BCx2 6/30: GNR>>> UC 6/30 >> Vanc Zosyn in ED Abx: Ciprofloxacin 6/30 >>> PCT algorithm   ENDOCRINE A:     No acute issues  P:   Monitor glucose on Chem Add SSI if consecutive over 180 Check TSH, cortisol.   NEUROLOGIC A:   Acute metabolic  encephalopathy  P:   RASS goal: 0 Monitor   FAMILY  - Updates:   - Inter-disciplinary family meet or Palliative Care meeting due by:  7/7    01/10/2015 12:12 PM  Still not fully resuscitated. Will give fluid challenge, cont ABX and narrow abx as indicated.   Erick Colace ACNP-BC [place] Pager # [phone] OR # [phone] if no answer  Attending Note:  I have examined patient, reviewed labs, studies and notes. I have discussed the case with Jerrye Bushy, and I agree with the data and plans as amended above. Pt with septic shock in setting of hydronephrosis and GNR bacteremia. She has improved overall since percutaneous drainage, but remains in shock. On eval she is pale, awake, interacting. Her flank pain is better. We will continue her volume resuscitation, broaden her abx to double cover GNR until her organism is speciated and sensitivities back. Independent critical care time is 50 minutes.   Baltazar Apo, MD, PhD 01/10/2015, 3:18 PM [facility] [phone] or if no answer [phone]

## 2015-01-10 NOTE — Procedures (Signed)
Central Venous Catheter Insertion Procedure Note Becky Rogers [id number] 1951-03-26  Procedure: Insertion of Central Venous Catheter Indications: Assessment of intravascular volume, Drug and/or fluid administration and Frequent blood sampling  Procedure Details Consent: Risks of procedure as well as the alternatives and risks of each were explained to the (patient/caregiver).  Consent for procedure obtained. Time Out: Verified patient identification, verified procedure, site/side was marked, verified correct patient position, special equipment/implants available, medications/allergies/relevent history reviewed, required imaging and test results available.  Performed  Maximum sterile technique was used including antiseptics, cap, gloves, gown, hand hygiene, mask and sheet. Skin prep: Chlorhexidine; local anesthetic administered A antimicrobial bonded/coated triple lumen catheter was placed in the left internal jugular vein using the Seldinger technique. Ultrasound guidance used.Yes.   Catheter placed to 20 cm. Blood aspirated via all 3 ports and then flushed x 3. Line sutured x 2 and dressing applied.  Evaluation Blood flow good Complications: No apparent complications Patient did tolerate procedure well. Chest X-ray ordered to verify placement.  CXR: pending.  Georgann Housekeeper, AGACNP-BC [place] [facility] Pager [phone] or [phone]  01/10/2015 12:37 AM    I was present for and supervised the entire procedure  Merton Border, MD ; [facility] service Mobile [phone].  After 5:30 PM or weekends, call [phone]

## 2015-01-11 ENCOUNTER — Inpatient Hospital Stay (HOSPITAL_COMMUNITY): Payer: BLUE CROSS/BLUE SHIELD

## 2015-01-11 DIAGNOSIS — N12 Tubulo-interstitial nephritis, not specified as acute or chronic: Secondary | ICD-10-CM | POA: Diagnosis present

## 2015-01-11 DIAGNOSIS — R1030 Lower abdominal pain, unspecified: Secondary | ICD-10-CM | POA: Diagnosis present

## 2015-01-11 DIAGNOSIS — R0902 Hypoxemia: Secondary | ICD-10-CM

## 2015-01-11 DIAGNOSIS — N136 Pyonephrosis: Secondary | ICD-10-CM

## 2015-01-11 LAB — POCT I-STAT 3, ART BLOOD GAS (G3+)
ACID-BASE DEFICIT: 5 mmol/L — AB (ref 0.0–2.0)
ACID-BASE DEFICIT: 9 mmol/L — AB (ref 0.0–2.0)
Acid-base deficit: 12 mmol/L — ABNORMAL HIGH (ref 0.0–2.0)
BICARBONATE: 18.9 meq/L — AB (ref 20.0–24.0)
Bicarbonate: 14.6 mEq/L — ABNORMAL LOW (ref 20.0–24.0)
Bicarbonate: 14 mEq/L — ABNORMAL LOW (ref 20.0–24.0)
O2 SAT: 95 %
O2 Saturation: 94 %
O2 Saturation: 89 %
PH ART: 7.409 (ref 7.350–7.450)
Patient temperature: 98
Patient temperature: 98.1
Patient temperature: 98.6
TCO2: 15 mmol/L (ref 0–100)
TCO2: 15 mmol/L (ref 0–100)
TCO2: 20 mmol/L (ref 0–100)
pCO2 arterial: 26.3 mmHg — ABNORMAL LOW (ref 35.0–45.0)
pCO2 arterial: 30.1 mmHg — ABNORMAL LOW (ref 35.0–45.0)
pCO2 arterial: 29.8 mmHg — ABNORMAL LOW (ref 35.0–45.0)
pH, Arterial: 7.351 (ref 7.350–7.450)
pH, Arterial: 7.272 — ABNORMAL LOW (ref 7.350–7.450)
pO2, Arterial: 71 mmHg — ABNORMAL LOW (ref 80.0–100.0)
pO2, Arterial: 63 mmHg — ABNORMAL LOW (ref 80.0–100.0)
pO2, Arterial: 72 mmHg — ABNORMAL LOW (ref 80.0–100.0)

## 2015-01-11 LAB — BASIC METABOLIC PANEL
Anion gap: 8 (ref 5–15)
BUN: 45 mg/dL — AB (ref 6–20)
CALCIUM: 7.5 mg/dL — AB (ref 8.9–10.3)
CHLORIDE: 110 mmol/L (ref 101–111)
CO2: 17 mmol/L — ABNORMAL LOW (ref 22–32)
Creatinine, Ser: 3.23 mg/dL — ABNORMAL HIGH (ref 0.44–1.00)
GFR calc Af Amer: 16 mL/min — ABNORMAL LOW (ref >60)
GFR calc non Af Amer: 14 mL/min — ABNORMAL LOW (ref >60)
Glucose, Bld: 102 mg/dL — ABNORMAL HIGH (ref 65–99)
POTASSIUM: 4.5 mmol/L (ref 3.5–5.1)
Sodium: 135 mmol/L (ref 135–145)

## 2015-01-11 LAB — GLUCOSE, CAPILLARY
GLUCOSE-CAPILLARY: 107 mg/dL — AB (ref 65–99)
Glucose-Capillary: 15 mg/dL — CL (ref 65–99)
Glucose-Capillary: 98 mg/dL (ref 65–99)

## 2015-01-11 LAB — CBC
HCT: 37.3 % (ref 36.0–46.0)
Hemoglobin: 13.6 g/dL (ref 12.0–15.0)
MCH: 30.8 pg (ref 26.0–34.0)
MCHC: 36.5 g/dL — AB (ref 30.0–36.0)
MCV: 84.4 fL (ref 78.0–100.0)
Platelets: 34 10*3/uL — ABNORMAL LOW (ref 150–400)
RBC: 4.42 MIL/uL (ref 3.87–5.11)
RDW: 13.2 % (ref 11.5–15.5)
WBC: 21.4 10*3/uL — ABNORMAL HIGH (ref 4.0–10.5)

## 2015-01-11 LAB — COMPREHENSIVE METABOLIC PANEL
ALBUMIN: 1.9 g/dL — AB (ref 3.5–5.0)
ALT: 13 U/L — AB (ref 14–54)
ANION GAP: 11 (ref 5–15)
AST: 54 U/L — AB (ref 15–41)
Alkaline Phosphatase: 106 U/L (ref 38–126)
BUN: 47 mg/dL — AB (ref 6–20)
CO2: 14 mmol/L — ABNORMAL LOW (ref 22–32)
Calcium: 7 mg/dL — ABNORMAL LOW (ref 8.9–10.3)
Chloride: 107 mmol/L (ref 101–111)
Creatinine, Ser: 3.23 mg/dL — ABNORMAL HIGH (ref 0.44–1.00)
GFR calc Af Amer: 16 mL/min — ABNORMAL LOW (ref >60)
GFR calc non Af Amer: 14 mL/min — ABNORMAL LOW (ref >60)
Glucose, Bld: 119 mg/dL — ABNORMAL HIGH (ref 65–99)
Potassium: 4.3 mmol/L (ref 3.5–5.1)
Sodium: 132 mmol/L — ABNORMAL LOW (ref 135–145)
Total Bilirubin: 1.5 mg/dL — ABNORMAL HIGH (ref 0.3–1.2)
Total Protein: 4.1 g/dL — ABNORMAL LOW (ref 6.5–8.1)

## 2015-01-11 LAB — PROCALCITONIN: PROCALCITONIN: 97.46 ng/mL

## 2015-01-11 LAB — PREPARE FRESH FROZEN PLASMA: Unit division: 0

## 2015-01-11 LAB — LACTIC ACID, PLASMA: LACTIC ACID, VENOUS: 4.1 mmol/L — AB (ref 0.5–2.0)

## 2015-01-11 LAB — MAGNESIUM: Magnesium: 1.8 mg/dL (ref 1.7–2.4)

## 2015-01-11 LAB — LACTATE DEHYDROGENASE: LDH: 449 U/L — AB (ref 98–192)

## 2015-01-11 LAB — FIBRINOGEN: Fibrinogen: 689 mg/dL — ABNORMAL HIGH (ref 204–475)

## 2015-01-11 LAB — CLOSTRIDIUM DIFFICILE BY PCR: Toxigenic C. Difficile by PCR: NEGATIVE

## 2015-01-11 MED ORDER — HYDROCORTISONE NA SUCCINATE PF 100 MG IJ SOLR
50.0000 mg | Freq: Four times a day (QID) | INTRAMUSCULAR | Status: DC
  Administered 2015-01-11 – 2015-01-15 (×16): 50 mg via INTRAVENOUS
  Filled 2015-01-11 (×9): qty 1
  Filled 2015-01-11: qty 2
  Filled 2015-01-11: qty 1
  Filled 2015-01-11: qty 2
  Filled 2015-01-11 (×2): qty 1
  Filled 2015-01-11 (×2): qty 2
  Filled 2015-01-11 (×4): qty 1

## 2015-01-11 MED ORDER — INSULIN ASPART 100 UNIT/ML ~~LOC~~ SOLN: 0.0000 [IU] | SUBCUTANEOUS | Status: DC

## 2015-01-11 MED ORDER — SODIUM BICARBONATE 8.4 % IV SOLN
INTRAVENOUS | Status: DC
  Administered 2015-01-11 (×2): via INTRAVENOUS
  Filled 2015-01-11 (×5): qty 150

## 2015-01-11 MED ORDER — SODIUM CHLORIDE 0.9 % IV SOLN
INTRAVENOUS | Status: DC
  Administered 2015-01-11: 13:00:00 via INTRAVENOUS

## 2015-01-11 MED ORDER — CARBIDOPA-LEVODOPA 25-100 MG PO TABS
2.0000 | ORAL_TABLET | Freq: Three times a day (TID) | ORAL | Status: DC
  Administered 2015-01-11 – 2015-01-16 (×12): 2 via ORAL
  Filled 2015-01-11 (×17): qty 2

## 2015-01-11 MED ORDER — TRIHEXYPHENIDYL HCL 2 MG PO TABS
2.0000 mg | ORAL_TABLET | Freq: Two times a day (BID) | ORAL | Status: DC
  Administered 2015-01-11 – 2015-01-16 (×8): 2 mg via ORAL
  Filled 2015-01-11 (×13): qty 1

## 2015-01-11 MED ORDER — SODIUM CHLORIDE 0.9 % IV SOLN
250.0000 mg | Freq: Two times a day (BID) | INTRAVENOUS | Status: DC
  Administered 2015-01-11 – 2015-01-12 (×3): 250 mg via INTRAVENOUS
  Filled 2015-01-11 (×4): qty 250

## 2015-01-11 MED ORDER — SODIUM CHLORIDE 0.9 % IV BOLUS (SEPSIS)
500.0000 mL | Freq: Once | INTRAVENOUS | Status: AC
  Administered 2015-01-11: 500 mL via INTRAVENOUS

## 2015-01-11 NOTE — Progress Notes (Signed)
Patient ID: Becky Rogers, female   DOB: Nov 09, 1950, 64 y.o.   MRN: [id number]    Subjective: Pt s/p left nephrostomy tube for obstructing left ureteral stone and sepsis.  She remains on vasopressin and norepinephrine drips.  Denies pain.  Objective: Vital signs in last 24 hours: Temp:  [98 F (36.7 C)-98.9 F (37.2 C)] 98.9 F (37.2 C) (07/02 1955) Pulse Rate:  [92-114] 92 (07/02 2100) Resp:  [18-34] 25 (07/02 2100) BP: (43-132)/(21-90) 107/54 mmHg (07/02 2100) SpO2:  [90 %-100 %] 98 % (07/02 2100) Arterial Line BP: (91-148)/(48-88) 122/63 mmHg (07/02 2100) Weight:  [60.2 kg (132 lb 11.5 oz)] 60.2 kg (132 lb 11.5 oz) (07/02 0358)  Intake/Output from previous day: 07/01 0701 - 07/02 0700 In: 5645.4 [I.V.:2990.4; IV Piggyback:2650] Out: 740 [Urine:740] Intake/Output this shift: Total I/O In: 438.4 [I.V.:338.4; IV Piggyback:100] Out: 0   Physical Exam:  General: Alert and oriented Abdomen: Soft, ND, No CVAT, L PCN draining well   Lab Results:  Recent Labs  01/09/15 2115 01/10/15 0050 01/11/15 0648  HGB 14.6 14.4 13.6  HCT 43.0 41.9 37.3   CBC Latest Ref Rng 01/11/2015 01/10/2015 01/09/2015  WBC 4.0 - 10.5 K/uL 21.4(H) 3.2(L) -  Hemoglobin 12.0 - 15.0 g/dL 13.6 14.4 14.6  Hematocrit 36.0 - 46.0 % 37.3 41.9 43.0  Platelets 150 - 400 K/uL 34(L) 80(L) -      BMET  Recent Labs  01/11/15 0648 01/11/15 1400  NA 132* 135  K 4.3 4.5  CL 107 110  CO2 14* 17*  GLUCOSE 119* 102*  BUN 47* 45*  CREATININE 3.23* 3.23*  CALCIUM 7.0* 7.5*     Studies/Results:  Urine culture: GNRs growing  Assessment/Plan: 1) Left ureteral stone/sepsis: Improving. Continue L PCN for drainage and continue broad spectrum IV antibiotics pending cultures.  Will eventually need 14 days of appropriate culture specific antibiotics prior to any definitive stone procedure.  Will eventually need outpatient follow up with Dr. Tresa Moore.    LOS: 2 days   Jodey Burbano,LES 01/11/2015, 10:14 PM

## 2015-01-11 NOTE — Procedures (Signed)
Arterial Catheter Insertion Procedure Note Becky Rogers [id number] 1951-04-19  Procedure: Insertion of Arterial Catheter  Indications: Blood pressure monitoring  Procedure Details Consent: Unable to obtain consent because of altered level of consciousness. Time Out: Verified patient identification, verified procedure, site/side was marked, verified correct patient position, special equipment/implants available, medications/allergies/relevent history reviewed, required imaging and test results available.  Performed  Maximum sterile technique was used including antiseptics, gloves, gown, hand hygiene and sheet. Skin prep: Chlorhexidine; local anesthetic administered 20 gauge catheter was inserted into right radial artery using the Seldinger technique.  Evaluation Blood flow good; BP tracing good. Complications: No apparent complications.   Mindi Slicker 01/11/2015

## 2015-01-11 NOTE — Progress Notes (Signed)
ANTIBIOTIC CONSULT NOTE - INITIAL  Pharmacy Consult for Imipenem Indication: pyelonephritis, GNR bacteremia  No Known Allergies  Patient Measurements: Height: 5\' 2"  (157.5 cm) Weight: 132 lb 11.5 oz (60.2 kg) IBW/kg (Calculated) : 50.1  Vital Signs: Temp: 98.1 F (36.7 C) (07/02 1222) Temp Source: Oral (07/02 1222) BP: 101/76 mmHg (07/02 1100) Pulse Rate: 107 (07/02 1100) Intake/Output from previous day: 07/01 0701 - 07/02 0700 In: 5645.4 [I.V.:2990.4; IV Piggyback:2650] Out: 740 [Urine:740] Intake/Output from this shift: Total I/O In: 789.3 [I.V.:789.3] Out: 160 [Urine:160]  Labs:  Recent Labs  01/09/15 2100 01/09/15 2115 01/10/15 0050 01/10/15 1600 01/11/15 0648  WBC 8.6  --  3.2*  --  21.4*  HGB 14.0 14.6 14.4  --  13.6  PLT 98*  --  80*  --  34*  CREATININE 2.90* 2.80* 2.62* 3.05* 3.23*   Estimated Creatinine Clearance: 15 mL/min (by C-G formula based on Cr of 3.23). No results for input(s): VANCOTROUGH, VANCOPEAK, VANCORANDOM, GENTTROUGH, GENTPEAK, GENTRANDOM, TOBRATROUGH, TOBRAPEAK, TOBRARND, AMIKACINPEAK, AMIKACINTROU, AMIKACIN in the last 72 hours.   Microbiology: Recent Results (from the past 720 hour(s))  Blood culture (routine x 2)     Status: None (Preliminary result)   Collection Time: 01/09/15  9:27 PM  Result Value Ref Range Status   Specimen Description BLOOD RIGHT ARM  Final   Special Requests BOTTLES DRAWN AEROBIC AND ANAEROBIC 5CC  Final   Culture  Setup Time   Final    GRAM NEGATIVE RODS IN BOTH AEROBIC AND ANAEROBIC BOTTLES CRITICAL RESULT CALLED TO, READ BACK BY AND VERIFIED WITH: Mariea Clonts, RN AT [phone] ON [id number] BY Rhea Bleacher    Culture GRAM NEGATIVE RODS  Final   Report Status PENDING  Incomplete  Blood culture (routine x 2)     Status: None (Preliminary result)   Collection Time: 01/09/15  9:34 PM  Result Value Ref Range Status   Specimen Description BLOOD RIGHT HAND  Final   Special Requests BOTTLES DRAWN AEROBIC ONLY 3CC  Final    Culture  Setup Time   Final    GRAM NEGATIVE RODS AEROBIC BOTTLE ONLY CRITICAL RESULT CALLED TO, READ BACK BY AND VERIFIED WITH: T HARVEY 01/10/15 @ 61 M VESTAL    Culture GRAM NEGATIVE RODS  Final   Report Status PENDING  Incomplete  MRSA PCR Screening     Status: None   Collection Time: 01/09/15 11:52 PM  Result Value Ref Range Status   MRSA by PCR NEGATIVE NEGATIVE Final    Comment:        The GeneXpert MRSA Assay (FDA approved for NASAL specimens only), is one component of a comprehensive MRSA colonization surveillance program. It is not intended to diagnose MRSA infection nor to guide or monitor treatment for MRSA infections.   Urine culture     Status: None (Preliminary result)   Collection Time: 01/10/15  1:46 AM  Result Value Ref Range Status   Specimen Description URINE, CATHETERIZED  Final   Special Requests NONE  Final   Culture >=100,000 COLONIES/mL GRAM NEGATIVE RODS  Final   Report Status PENDING  Incomplete    Assessment: 64yo female presents from home with abdominal pain and diaphoresis. Initially started on Vanc and Zosyn for presumed sepsis. Source is L pyelonephritis and blood and urine cultures are positive for GNR. Antibiotics now to be changed to Imipenem monotherapy pending ID and susceptibility results.  Goal of Therapy:  Resolution of infection  Plan:  Imipenem 250mg  IV q12h Will f/u  renal function, micro data, pt's clinical condition  Heide Guile, PharmD, BCPS-AQ [place] Clinical Pharmacist Pager [phone]

## 2015-01-11 NOTE — Progress Notes (Addendum)
PULMONARY / CRITICAL CARE MEDICINE   Name: Becky Rogers MRN: [id number] DOB: [birth year]    ADMISSION DATE:  01/09/2015 CONSULTATION DATE:  01/09/2015  REFERRING MD :  EDP  CHIEF COMPLAINT:  Abd pain  INITIAL PRESENTATION: 64 year old female with parkinsons dz presented 6/30 with abdominal pain. In ED she was profoundly hypotensive which was not responsive to IVF resuscitation. Lactic acid elevation, started on pressors. PCCM to admit.   STUDIES:  6/30 CT abd > 34mm L UPJ stone with mild dhydronephrosis, Gallbladder sludge vs small stones, peripancreatic fluid. Bilateral lower lobe pulmonary opacities.   SIGNIFICANT EVENTS: 6/30 admitted for presumed sepsis 6/30 left perc nephrostomy tube placed. GNR growing in urine.   Subjective/Overnight:  Remains on low dose pressors. UOP remains low but improving slowly.  Husband concerned about worsening confusion, not getting home parkinsons meds.   VITAL SIGNS: Temp:  [98 F (36.7 C)-98.2 F (36.8 C)] 98.1 F (36.7 C) (07/02 0830) Pulse Rate:  [92-115] 99 (07/02 1000) Resp:  [16-34] 34 (07/02 1000) BP: (43-132)/(21-90) 92/56 mmHg (07/02 1000) SpO2:  [71 %-100 %] 100 % (07/02 1000) Arterial Line BP: (97-135)/(48-71) 108/65 mmHg (07/02 1000) FiO2 (%):  [100 %] 100 % (07/01 1450) Weight:  [132 lb 11.5 oz (60.2 kg)] 132 lb 11.5 oz (60.2 kg) (07/02 0358) HEMODYNAMICS: CVP:  [6 mmHg-12 mmHg] 8 mmHg VENTILATOR SETTINGS: Vent Mode:  [-]  FiO2 (%):  [100 %] 100 % INTAKE / OUTPUT:  Intake/Output Summary (Last 24 hours) at 01/11/15 1205 Last data filed at 01/11/15 1000  Gross per 24 hour  Intake 5932.08 ml  Output    745 ml  Net 5187.08 ml    PHYSICAL EXAMINATION: General:  Female of normal body habitus in NAD Neuro:  Drowsy, confused, follows some commands  HEENT:  [place]/AT, no JVD noted, PERRL.  Cardiovascular:  RRR, no MRG Lungs:  resps even non labored, few bibasilar crackles  Abdomen:  Soft, non-tender,  non-distended Musculoskeletal:  No acute deformity or ROM limitaiton Skin:  Grossly intact, mottled   LABS:  CBC  Recent Labs Lab 01/09/15 2100 01/09/15 2115 01/10/15 0050 01/11/15 0648  WBC 8.6  --  3.2* 21.4*  HGB 14.0 14.6 14.4 13.6  HCT 40.9 43.0 41.9 37.3  PLT 98*  --  80* 34*   Coag's  Recent Labs Lab 01/10/15 0050  INR 2.22*   BMET  Recent Labs Lab 01/10/15 0050 01/10/15 1600 01/11/15 0648  NA 138 136 132*  K 3.0* 4.3 4.3  CL 108 109 107  CO2 15* 17* 14*  BUN 38* 44* 47*  CREATININE 2.62* 3.05* 3.23*  GLUCOSE 150* 131* 119*   Electrolytes  Recent Labs Lab 01/10/15 0050 01/10/15 1600 01/11/15 0648  CALCIUM 6.9* 7.2* 7.0*  MG 1.4*  --  1.8  PHOS 2.9  --   --    Sepsis Markers  Recent Labs Lab 01/09/15 2120 01/10/15 0050 01/10/15 0738 01/11/15 0648  LATICACIDVEN 5.55* 7.2* 4.1*  --   PROCALCITON  --  95.51  81.80  --  97.46   ABG  Recent Labs Lab 01/11/15 0058  PHART 7.351  PCO2ART 26.3*  PO2ART 71.0*   Liver Enzymes  Recent Labs Lab 01/09/15 2100 01/10/15 0050 01/11/15 0648  AST 42* 49* 54*  ALT 20 23 13*  ALKPHOS 270* 154* 106  BILITOT 1.3* 1.1 1.5*  ALBUMIN 3.2* 2.5* 1.9*   Cardiac Enzymes No results for input(s): TROPONINI, PROBNP in the last 168 hours. Glucose  Recent Labs Lab 01/09/15 2058 01/10/15 0003  GLUCAP 90 117*    Imaging No results found.   ASSESSMENT / PLAN:  PULMONARY A: Bibasilar opacifications on CT concerning for early ALI P:   Supplemental O2 as needed  CXR in AM Incentive spirometry    CARDIOVASCULAR CVL 6/30 >>> A:  Septic Shock, likely septic in setting pyelo Lactate clearing  P:  MAP goal > 6mm/Hg Continue gentle volume resuscitation CVP monitoring goal 8-12 Ck cortisol, 21, borderline continued pressors, consider stress roids Norepinephrine for MAP goal F/u lactate    RENAL A:   L UPJ stone and hydronephrosis, s/p Left Perc nephrostomy tube 7/1 AKI/ ATN   AGMA/lactic acidosis  Hypokalemia Hypomagnesemia  P:   Change MIVF to NS  NS bolus  Renal dose meds Replete K and Mg Follow Bmet   GASTROINTESTINAL A:   ? Cholelithiasis Upper abdominal, peripancreatic and periportal fluid on CT >LFTs trending down  Poor PO intake  P:   Clear liquids as able  Consider TF if continues to not adequately take PO's - will hold off one more day, then will likely need panda and TF IV Protonix F/u LFTs intermittently   HEMATOLOGIC A:   Thrombocytopenia P:  Follow CBC Heparin SQ for VTE ppx   INFECTIOUS A:   Severe sepsis secondary to L pyelonephritis  P:   BCx2 6/30: GNR>>> UC 6/30 >>GNR>>> CDiff 7/2>>> Ciprofloxacin 6/30 >>> ceftaz 7/1>>> PCT algorithm concerning    ENDOCRINE A:     No acute issues P:   Monitor glucose on Chem   NEUROLOGIC A:   Acute metabolic encephalopathy Hx parkinson's, dementia  P:   RASS goal: 0 Monitor Resume home parkinson's rx    FAMILY  - Updates:  Husband updated at length at bedside 7/2  - Inter-disciplinary family meet or Palliative Care meeting due by:  7/7    Nickolas Madrid, NP 01/11/2015  12:05 PM Pager: [phone] or [phone]  STAFF NOTE: Linwood Dibbles, MD FACP have personally reviewed patient's available data, including medical history, events of note, physical examination and test results as part of my evaluation. I have discussed with resident/NP and other care providers such as pharmacist, RN and RRT. In addition, I personally evaluated patient and elicited key findings of: concerned about resp status, obtain pcxr , abg, at risk aspiration / LLL ATX, PCt rising, gram neg noted, change to imipenem, not progressing, [place] stat assess gallbladder that was concerning on CT and perc nephrostomy FXN now, cvp 13, avoid further bolus, NONAG acidosis, ad bicarb, follow plat , INR, assess fibrinogen for DIC, Tx plat if bleeding noted, I updated family in full, cortisol  borderline, add stress roids, provide sinemet best we can, NO haldol or any antidop The patient is critically ill with multiple organ systems failure and requires high complexity decision making for assessment and support, frequent evaluation and titration of therapies, application of advanced monitoring technologies and extensive interpretation of multiple databases.   Critical Care Time devoted to patient care services described in this note is30 Minutes. This time reflects time of care of this signee: Merrie Roof, MD FACP. This critical care time does not reflect procedure time, or teaching time or supervisory time of PA/NP/Med student/Med Resident etc but could involve care discussion time. Rest per NP/medical resident whose note is outlined above and that I agree with   Lavon Paganini. Titus Mould, MD, [place] Pgr: [facility] 01/11/2015 1:16 PM

## 2015-01-11 NOTE — Progress Notes (Signed)
Patient ID: Becky Rogers, female   DOB: 05/27/1951, 64 y.o.   MRN: [id number]    Referring Physician(s): CCM  Subjective:  Pt awake, responds to voice, not F/C; still on pressors  Allergies: Review of patient's allergies indicates no known allergies.  Medications: Prior to Admission medications   Medication Sig Start Date End Date Taking? Authorizing Provider  amLODipine (NORVASC) 5 MG tablet Take 5 mg by mouth daily.   Yes Historical Provider, MD  aspirin 81 MG tablet Take 81 mg by mouth daily.   Yes Historical Provider, MD  atorvastatin (LIPITOR) 40 MG tablet Take 40 mg by mouth daily.   Yes Historical Provider, MD  CALCIUM-VITAMIN D PO Take 2 tablets by mouth daily.   Yes Historical Provider, MD  carbidopa-levodopa (SINEMET IR) 25-100 MG per tablet Take 2 tablets by mouth 3 (three) times daily.   Yes Historical Provider, MD  clonazePAM (KLONOPIN) 0.5 MG tablet Take 0.5 mg by mouth at bedtime.   Yes Historical Provider, MD  fish oil-omega-3 fatty acids 1000 MG capsule Take 3 g by mouth daily.   Yes Historical Provider, MD  Multiple Vitamins-Minerals (MULTIVITAMINS THER. W/MINERALS) TABS Take 1 tablet by mouth daily.   Yes Historical Provider, MD  TRIHEXYPHENIDYL HCL PO Take 1 tablet by mouth 2 (two) times daily.   Yes Historical Provider, MD  HYDROcodone-acetaminophen (NORCO) 5-325 MG per tablet Take 1-2 tablets by mouth every 6 (six) hours as needed for moderate pain or severe pain. Patient not taking: Reported on 01/09/2015 08/28/14   [name], MD     Vital Signs: BP 101/76 mmHg  Pulse 107  Temp(Src) 98.1 F (36.7 C) (Oral)  Resp 25  Ht 5\' 2"  (1.575 m)  Wt 132 lb 11.5 oz (60.2 kg)  BMI 24.27 kg/m2  SpO2 91%  Physical Exam left PCN intact, output 295 cc's yellow urine, urine/blood cx's- gm neg rods  Imaging: Ct Abdomen Pelvis Wo Contrast  01/09/2015   CLINICAL DATA:  64 year old female with weight loss and abdominal pain.  EXAM: CT ABDOMEN AND PELVIS WITHOUT  CONTRAST  TECHNIQUE: Multidetector CT imaging of the abdomen and pelvis was performed following the standard protocol without IV contrast.  COMPARISON:  None.  FINDINGS: Evaluation of this exam is limited in the absence of intravenous contrast. Evaluation is also limited due to streak artifact caused by patient's arms.  Lower chest: Patchy bilateral lower lobe ground-glass airspace opacity as well as bilateral interstitial prominence may be related to congestive changes or pneumonia. Clinical correlation is recommended. There is coronary vascular calcification. The  Peritoneum: No intraperitoneal free air. There is diffuse stranding of the upper abdominal mesentery with small amount of subhepatic/pericholecystic fluid.  Liver: Unremarkable.Mild periportal edema.  Gallbladder: There is high density contained within the gallbladder likely representing sludge or small stones. There is small amount of pericholecystic fluid. Ultrasound is recommended for better evaluation of the gallbladder.  Pancreas: There is mild peripancreatic stranding. Correlation with pancreatic enzymes recommended to exclude pancreatitis.No ductal dilatation.  Spleen: Unremarkable.  Adrenals glands: Unremarkable.  Kidneys, ureters, urinary bladder: There is an 8 mm left UPJ stone with mild left hydronephrosis. Small nonobstructing left renal inferior pole calculus noted. The right kidney is unremarkable. StopThe urinary bladder is grossly unremarkable.  Reproductive:  Unremarkable.  Bowel and appendix:Loose stool noted throughout the colon. No bowel obstruction. The appendix is not visualized with certainty. No inflammatory changes identified in the right lower quadrant.  Vascular/Lymphatic: Mild atherosclerotic calcification of the aorta.No lymphadenopathy.  Abdominal wall/Musculoskeletal: Degenerative changes of the spine.  IMPRESSION: An 8 mm left UPJ stone with mild left hydronephrosis.  Gallbladder sludge versus small stones. Correlation  with ultrasound is recommended for better evaluation of the gallbladder.  Upper abdominal, peripancreatic and periportal fluid. Correlation with pancreatic enzymes as well as liver function tests recommended.  No evidence of bowel obstruction.  Bilateral lower lobe pulmonary opacities may represent congestive changes versus pneumonia. The constellation of findings may represent underlying sepsis. Correlation with clinical exam and lab values and cultures recommended.   Electronically Signed   By: Anner Crete M.D.   On: 01/09/2015 23:18   Dg Chest Portable 1 View  01/11/2015   CLINICAL DATA:  Hypoxia.  Septic shock.  Pyelonephritis.  EXAM: PORTABLE CHEST - 1 VIEW  COMPARISON:  Chest x-rays dated 01/10/2015, 01/09/2015 and 05/08/2007  FINDINGS: Central line appears in good position at the cavoatrial junction. Increasing bilateral pulmonary infiltrates with new bilateral pleural effusions. The findings consistent with pulmonary edema. The possibility of ARDS should be considered. Heart size is normal. Slight pulmonary vascular prominence.  IMPRESSION: Progressive bilateral pulmonary infiltrates and pleural effusions. Findings are consistent with pulmonary edema.   Electronically Signed   By: Lorriane Shire M.D.   On: 01/11/2015 13:32   Dg Chest Port 1 View  01/10/2015   CLINICAL DATA:  Hypertension.  Difficulty breathing  EXAM: PORTABLE CHEST - 1 VIEW  COMPARISON:  Study obtained earlier in the day and January 09, 2015  FINDINGS: Central catheter tip is in the superior vena cava. No pneumothorax. There is consolidation in the left base which was not present 1 day prior. There is subtle increased opacity in the medial right base, also new from 1 day prior. Elsewhere lungs are clear. Heart size and pulmonary vascularity are normal. No adenopathy. No bone lesions.  IMPRESSION: Airspace consolidation in the medial left base. More subtle opacity in the medial right base, concerning for early pneumonia as well. Lungs  elsewhere clear. No change in cardiac silhouette.   Electronically Signed   By: Lowella Grip III M.D.   On: 01/10/2015 07:58   Dg Chest Port 1 View  01/10/2015   CLINICAL DATA:  64 year old female with central line placement.  EXAM: PORTABLE CHEST - 1 VIEW  COMPARISON:  Chest radiograph dated 01/09/2015.  FINDINGS: Left IJ central line with tip or central SVC.  No pneumothorax.  There bilateral perihilar haziness concerning for a degree of congestion. No focal consolidation. No pleural effusion. The cardiac silhouette is within normal limits. The osseous structures are grossly unremarkable.  IMPRESSION: Left IJ central line the tip over central SVC.  No pneumothorax.  Bilateral perihilar haziness, increased from earlier study.   Electronically Signed   By: Anner Crete M.D.   On: 01/10/2015 00:52   Dg Chest Portable 1 View  01/09/2015   CLINICAL DATA:  Weakness and cyanosis.  EXAM: PORTABLE CHEST - 1 VIEW  COMPARISON:  05/08/2007  FINDINGS: A single AP portable view of the chest demonstrates no focal airspace consolidation or alveolar edema. The lungs are grossly clear. There is no large effusion or pneumothorax. Cardiac and mediastinal contours appear unremarkable.  IMPRESSION: No active disease.   Electronically Signed   By: Andreas Newport M.D.   On: 01/09/2015 21:25   Ir Nephrostogram Left Initial Placement  01/10/2015   CLINICAL DATA:  64 year old female with obstructing left UVJ stone and urosepsis likely secondary to obstructed pyonephrosis. She requires urgent decompression of the left renal  collecting system and presents for placement of a percutaneous nephrostomy tube.  EXAM: IR NEPHROSTOGRAM INI PLACEMENT LEFT  Date: 01/10/2015  PROCEDURE: 1. Ultrasound-guided puncture of a posterior interpolar calyx 2. Placement of a 10 French percutaneous nephrostomy tube under fluoroscopic guidance Interventional Radiologist:  Criselda Peaches, MD  ANESTHESIA/SEDATION: 1 mg Versed said administered  for anxiolysis. Additional sedation could not be administered secondary to significant hypotension despite pressor support.  MEDICATIONS: 400 mg ciprofloxacin administered intravenously just prior to patient arriving in interventional radiology  FLUOROSCOPY TIME:  1 minutes 36 seconds  CONTRAST:  52mL OMNIPAQUE IOHEXOL 300 MG/ML  SOLN  TECHNIQUE: Informed consent was obtained from the patient following explanation of the procedure, risks, benefits and alternatives. The patient understands, agrees and consents for the procedure. All questions were addressed. A time out was performed.  Maximal barrier sterile technique utilized including caps, mask, sterile gowns, sterile gloves, large sterile drape, hand hygiene, and Betadine skin prep. The left flank was interrogated with ultrasound. The kidney is easily identified and is mildly hydronephrotic. A suitable skin entry site was selected and marked. Local anesthesia was attained by infiltration with 1% lidocaine. A small dermatotomy was made. Under real-time sonographic guidance, a posterior calyx in the interpolar region was punctured using a 21 gauge Accustick needle. A gentle hand injection of contrast material under fluoroscopy confirmed access to the renal collecting system.  A 0.018 inch wire was advanced into the renal pelvis followed by the Accustick sheath. The 0.018 wire was then exchanged for a short 0.035 Amplatz wire. The tract was then dilated to 10 [place] and a Cook 10.2 [place] all-purpose drainage catheter advanced over the wire and formed within the renal pelvis. A small amount of urine was aspirated and sent for culture.  A gentle hand injection of contrast material confirmed location of the tube within the renal pelvis. There is complete obstruction at the UPJ secondary to the presence of the obstructing stone.  The tube was connected to gravity bag drainage and secured to the skin with 0 Prolene suture and an adhesive fixation device.  COMPLICATIONS:  None  IMPRESSION: Successful placement of a 10 French left-sided percutaneous nephrostomy tube.  A small aspirate of urine was sent for culture.  Signed,  Criselda Peaches, MD  [facility]  [facility]   Electronically Signed   By: Jacqulynn Cadet M.D.   On: 01/10/2015 07:43    Labs:  CBC:  Recent Labs  01/09/15 2100 01/09/15 2115 01/10/15 0050 01/11/15 0648  WBC 8.6  --  3.2* 21.4*  HGB 14.0 14.6 14.4 13.6  HCT 40.9 43.0 41.9 37.3  PLT 98*  --  80* 34*    COAGS:  Recent Labs  01/10/15 0050  INR 2.22*    BMP:  Recent Labs  01/09/15 2100 01/09/15 2115 01/10/15 0050 01/10/15 1600 01/11/15 0648  NA 139 138 138 136 132*  K 3.3* 3.2* 3.0* 4.3 4.3  CL 104 101 108 109 107  CO2 21*  --  15* 17* 14*  GLUCOSE 92 88 150* 131* 119*  BUN 38* 38* 38* 44* 47*  CALCIUM 8.6*  --  6.9* 7.2* 7.0*  CREATININE 2.90* 2.80* 2.62* 3.05* 3.23*  GFRNONAA 16*  --  18* 15* 14*  GFRAA 19*  --  21* 18* 16*    LIVER FUNCTION TESTS:  Recent Labs  01/09/15 2100 01/10/15 0050 01/11/15 0648  BILITOT 1.3* 1.1 1.5*  AST 42* 49* 54*  ALT 20  23 13*  ALKPHOS 270* 154* 106  PROT 5.4* 4.4* 4.1*  ALBUMIN 3.2* 2.5* 1.9*    Assessment and Plan:  S/p left PCN secondary to obst stone/ pyonephrosis; afebrile; check final cx's/sens; WBC up to 21.4, plts 34 k, creat 3.23 (3.05); CXR with pulm edema; check [place] abd/GB; additional plans as per CCM/urology  Signed: D. Rowe Robert 01/11/2015, 2:00 PM   I spent a total of 15 minutes in face to face in clinical consultation/evaluation, greater than 50% of which was counseling/coordinating care for left perc nephrostomy

## 2015-01-12 ENCOUNTER — Inpatient Hospital Stay (HOSPITAL_COMMUNITY): Payer: BLUE CROSS/BLUE SHIELD

## 2015-01-12 DIAGNOSIS — R918 Other nonspecific abnormal finding of lung field: Secondary | ICD-10-CM | POA: Diagnosis present

## 2015-01-12 DIAGNOSIS — R4182 Altered mental status, unspecified: Secondary | ICD-10-CM | POA: Diagnosis present

## 2015-01-12 DIAGNOSIS — R401 Stupor: Secondary | ICD-10-CM

## 2015-01-12 DIAGNOSIS — Z452 Encounter for adjustment and management of vascular access device: Secondary | ICD-10-CM | POA: Diagnosis present

## 2015-01-12 LAB — POCT I-STAT 3, ART BLOOD GAS (G3+)
Acid-base deficit: 4 mmol/L — ABNORMAL HIGH (ref 0.0–2.0)
BICARBONATE: 19 meq/L — AB (ref 20.0–24.0)
O2 Saturation: 89 %
PH ART: 7.448 (ref 7.350–7.450)
TCO2: 20 mmol/L (ref 0–100)
pCO2 arterial: 27.5 mmHg — ABNORMAL LOW (ref 35.0–45.0)
pO2, Arterial: 52 mmHg — ABNORMAL LOW (ref 80.0–100.0)

## 2015-01-12 LAB — GLUCOSE, CAPILLARY
GLUCOSE-CAPILLARY: 115 mg/dL — AB (ref 65–99)
GLUCOSE-CAPILLARY: 92 mg/dL (ref 65–99)
GLUCOSE-CAPILLARY: 97 mg/dL (ref 65–99)
Glucose-Capillary: 117 mg/dL — ABNORMAL HIGH (ref 65–99)
Glucose-Capillary: 116 mg/dL — ABNORMAL HIGH (ref 65–99)
Glucose-Capillary: <10 mg/dL — CL (ref 65–99)
Glucose-Capillary: 74 mg/dL (ref 65–99)
Glucose-Capillary: <10 mg/dL — CL (ref 65–99)

## 2015-01-12 LAB — BASIC METABOLIC PANEL
ANION GAP: 10 (ref 5–15)
BUN: 47 mg/dL — AB (ref 6–20)
CALCIUM: 7.8 mg/dL — AB (ref 8.9–10.3)
CHLORIDE: 107 mmol/L (ref 101–111)
CO2: 20 mmol/L — ABNORMAL LOW (ref 22–32)
Creatinine, Ser: 3.21 mg/dL — ABNORMAL HIGH (ref 0.44–1.00)
GFR calc Af Amer: 16 mL/min — ABNORMAL LOW (ref >60)
GFR calc non Af Amer: 14 mL/min — ABNORMAL LOW (ref >60)
Glucose, Bld: 121 mg/dL — ABNORMAL HIGH (ref 65–99)
POTASSIUM: 4.6 mmol/L (ref 3.5–5.1)
Sodium: 137 mmol/L (ref 135–145)

## 2015-01-12 LAB — CULTURE, BLOOD (ROUTINE X 2)

## 2015-01-12 LAB — LACTIC ACID, PLASMA: LACTIC ACID, VENOUS: 2.9 mmol/L — AB (ref 0.5–2.0)

## 2015-01-12 LAB — CBC
HEMATOCRIT: 36.3 % (ref 36.0–46.0)
Hemoglobin: 12.8 g/dL (ref 12.0–15.0)
MCH: 30 pg (ref 26.0–34.0)
MCHC: 35.3 g/dL (ref 30.0–36.0)
MCV: 85 fL (ref 78.0–100.0)
PLATELETS: 24 10*3/uL — AB (ref 150–400)
RBC: 4.27 MIL/uL (ref 3.87–5.11)
RDW: 13.7 % (ref 11.5–15.5)
WBC: 26.7 10*3/uL — ABNORMAL HIGH (ref 4.0–10.5)

## 2015-01-12 LAB — URINE CULTURE: Culture: >=100000

## 2015-01-12 LAB — PROCALCITONIN: PROCALCITONIN: 50.73 ng/mL

## 2015-01-12 LAB — PROTIME-INR
INR: 1.36 (ref 0.00–1.49)
Prothrombin Time: 16.9 seconds — ABNORMAL HIGH (ref 11.6–15.2)

## 2015-01-12 MED ORDER — FUROSEMIDE 10 MG/ML IJ SOLN
60.0000 mg | Freq: Two times a day (BID) | INTRAMUSCULAR | Status: DC
  Administered 2015-01-12 (×2): 60 mg via INTRAVENOUS
  Filled 2015-01-12 (×4): qty 6

## 2015-01-12 MED ORDER — CEFTRIAXONE SODIUM IN DEXTROSE 40 MG/ML IV SOLN
2.0000 g | INTRAVENOUS | Status: DC
  Administered 2015-01-12 – 2015-01-15 (×4): 2 g via INTRAVENOUS
  Filled 2015-01-12 (×7): qty 50

## 2015-01-12 NOTE — Progress Notes (Signed)
Subjective:  Husband @ bedside. Reports wife slightly more alert.  Objective: Vital signs in last 24 hours: Temp:  [98.1 F (36.7 C)-99 F (37.2 C)] 99 F (37.2 C) (07/03 0834) Pulse Rate:  [69-107] 84 (07/03 1000) Resp:  [20-34] 26 (07/03 1000) BP: (101-119)/(44-76) 110/62 mmHg (07/03 1000) SpO2:  [91 %-100 %] 97 % (07/03 1000) Arterial Line BP: (91-181)/(57-124) 134/70 mmHg (07/03 1000) Weight:  [62 kg (136 lb 11 oz)] 62 kg (136 lb 11 oz) (07/03 0347)  Intake/Output from previous day: 07/02 0701 - 07/03 0700 In: 3880.7 [I.V.:3675.7; IV Piggyback:200] Out: 1120 [Urine:1120] Intake/Output this shift: Total I/O In: 163.1 [I.V.:163.1] Out: 100 [Urine:100]  Physical Exam:  Constitutional: Vital signs reviewed. WD WN in NAD. Lethargic Eyes: PERRL, No scleral icterus.   Pulmonary/Chest: Normal effort Extremities: No cyanosis or edema   Lab Results:  Recent Labs  01/10/15 0050 01/11/15 0648 01/12/15 0353  HGB 14.4 13.6 12.8  HCT 41.9 37.3 36.3   BMET  Recent Labs  01/11/15 1400 01/12/15 0353  NA 135 137  K 4.5 4.6  CL 110 107  CO2 17* 20*  GLUCOSE 102* 121*  BUN 45* 47*  CREATININE 3.23* 3.21*  CALCIUM 7.5* 7.8*    Recent Labs  01/10/15 0050  INR 2.22*   No results for input(s): LABURIN in the last 72 hours. Results for orders placed or performed during the hospital encounter of 01/09/15  Blood culture (routine x 2)     Status: None   Collection Time: 01/09/15  9:27 PM  Result Value Ref Range Status   Specimen Description BLOOD RIGHT ARM  Final   Special Requests BOTTLES DRAWN AEROBIC AND ANAEROBIC 5CC  Final   Culture  Setup Time   Final    GRAM NEGATIVE RODS IN BOTH AEROBIC AND ANAEROBIC BOTTLES CRITICAL RESULT CALLED TO, READ BACK BY AND VERIFIED WITH: Mariea Clonts, RN AT [place] [id number] BY S. YARBROUGH    Culture ESCHERICHIA COLI  Final   Report Status 01/12/2015 FINAL  Final   Organism ID, Bacteria ESCHERICHIA COLI  Final      Susceptibility    Escherichia coli - MIC*    AMPICILLIN 8 SENSITIVE Sensitive     CEFAZOLIN <=4 SENSITIVE Sensitive     CEFEPIME <=1 SENSITIVE Sensitive     CEFTAZIDIME <=1 SENSITIVE Sensitive     CEFTRIAXONE <=1 SENSITIVE Sensitive     CIPROFLOXACIN <=0.25 SENSITIVE Sensitive     GENTAMICIN <=1 SENSITIVE Sensitive     IMIPENEM <=0.25 SENSITIVE Sensitive     TRIMETH/SULFA <=20 SENSITIVE Sensitive     AMPICILLIN/SULBACTAM 4 SENSITIVE Sensitive     PIP/TAZO <=4 SENSITIVE Sensitive     * ESCHERICHIA COLI  Blood culture (routine x 2)     Status: None (Preliminary result)   Collection Time: 01/09/15  9:34 PM  Result Value Ref Range Status   Specimen Description BLOOD RIGHT HAND  Final   Special Requests BOTTLES DRAWN AEROBIC ONLY 3CC  Final   Culture  Setup Time   Final    GRAM NEGATIVE RODS AEROBIC BOTTLE ONLY CRITICAL RESULT CALLED TO, READ BACK BY AND VERIFIED WITH: T HARVEY 01/10/15 @ 64 M VESTAL    Culture   Final    ESCHERICHIA COLI SUSCEPTIBILITIES PERFORMED ON PREVIOUS CULTURE WITHIN THE LAST 5 DAYS.    Report Status PENDING  Incomplete  MRSA PCR Screening     Status: None   Collection Time: 01/09/15 11:52 PM  Result Value Ref Range Status  MRSA by PCR NEGATIVE NEGATIVE Final    Comment:        The GeneXpert MRSA Assay (FDA approved for NASAL specimens only), is one component of a comprehensive MRSA colonization surveillance program. It is not intended to diagnose MRSA infection nor to guide or monitor treatment for MRSA infections.   Urine culture     Status: None   Collection Time: 01/10/15  1:46 AM  Result Value Ref Range Status   Specimen Description URINE, CATHETERIZED  Final   Special Requests NONE  Final   Culture >=100,000 COLONIES/mL ESCHERICHIA COLI  Final   Report Status 01/12/2015 FINAL  Final   Organism ID, Bacteria ESCHERICHIA COLI  Final      Susceptibility   Escherichia coli - MIC*    AMPICILLIN 8 SENSITIVE Sensitive     CEFAZOLIN <=4 SENSITIVE Sensitive      CEFTRIAXONE <=1 SENSITIVE Sensitive     CIPROFLOXACIN <=0.25 SENSITIVE Sensitive     GENTAMICIN <=1 SENSITIVE Sensitive     IMIPENEM <=0.25 SENSITIVE Sensitive     NITROFURANTOIN <=16 SENSITIVE Sensitive     TRIMETH/SULFA <=20 SENSITIVE Sensitive     AMPICILLIN/SULBACTAM 4 SENSITIVE Sensitive     PIP/TAZO <=4 SENSITIVE Sensitive     * >=100,000 COLONIES/mL ESCHERICHIA COLI  Clostridium Difficile by PCR (not at [facility])     Status: None   Collection Time: 01/11/15 11:08 AM  Result Value Ref Range Status   C difficile by pcr NEGATIVE NEGATIVE Final    Studies/Results: US Abdomen Port  01/11/2015   CLINICAL DATA:  Evaluate gallbladder.  EXAM: ULTRASOUND PORTABLE ABDOMEN  COMPARISON:  CT of 01/09/2015  FINDINGS: Gallbladder: Gallbladder distension at 8.4 cm. Sludge within. Wall thickness upper normal. No pericholecystic fluid. Sonographic Murphy's sign was not elicited.  Common bile duct: Diameter: Upper normal for age, 7 mm. No obstruction on prior CT.  Liver: Mild intrahepatic ductal dilatation within the left lobe. No focal liver lesion.  IVC: No abnormality visualized.  Pancreas: Visualized portion unremarkable.  Spleen: Size and appearance within normal limits.  Right Kidney: Length: 11.3 cm. No hydronephrosis. Mild increased echogenicity.  Left Kidney: Length: 11.7 cm. Poorly evaluated. No gross hydronephrosis.  Abdominal aorta: No aneurysm visualized.  Other findings: Bilateral pleural effusions.  IMPRESSION: 1. Gallbladder sludge and gallbladder distention, without specific evidence of acute cholecystitis. 2. Upper normal common duct size with mild intrahepatic duct dilatation in the left hepatic lobe. No obstructive mass or stone seen on recent CT. If bilirubin is elevated, MRCP may be informative to exclude otherwise occult obstruction. 3. Small bilateral pleural effusions. 4. Suboptimal evaluation of the left kidney. 5. Increased echogenicity within the right kidney may represent medical renal  disease.   Electronically Signed   By: Abigail Miyamoto M.D.   On: 01/11/2015 19:34   Dg Chest Portable 1 View  01/11/2015   CLINICAL DATA:  Hypoxia.  Septic shock.  Pyelonephritis.  EXAM: PORTABLE CHEST - 1 VIEW  COMPARISON:  Chest x-rays dated 01/10/2015, 01/09/2015 and 05/08/2007  FINDINGS: Central line appears in good position at the cavoatrial junction. Increasing bilateral pulmonary infiltrates with new bilateral pleural effusions. The findings consistent with pulmonary edema. The possibility of ARDS should be considered. Heart size is normal. Slight pulmonary vascular prominence.  IMPRESSION: Progressive bilateral pulmonary infiltrates and pleural effusions. Findings are consistent with pulmonary edema.   Electronically Signed   By: Lorriane Shire M.D.   On: 01/11/2015 13:32    Assessment/Plan:   Ureteral stone w/  pyonephrosis--drained w/ PCT. Stable/improving. Urine culture--pansensitive E. Coli. On appropriate abx.  Continue current drainage. Will need extended abx, followed by OP intervention for stone.   LOS: 3 days   Franchot Gallo M 01/12/2015, 10:09 AM

## 2015-01-12 NOTE — Progress Notes (Signed)
CRITICAL VALUE ALERT  Critical value received: Lactic Acid 2.9  Date of notification:  7-3  Time of notification:  1240  Critical value read back: yes  Nurse who received alert:  Josph Macho  MD notified (1st page):  Joellen Jersey NP CCM  Time of first page:   MD notified (2nd page):  Time of second page:  Responding MD:    Time MD responded:

## 2015-01-12 NOTE — Progress Notes (Signed)
PULMONARY / CRITICAL CARE MEDICINE   Name: Becky Rogers MRN: [id number] DOB: 19-Mar-1951    ADMISSION DATE:  01/09/2015 CONSULTATION DATE:  01/09/2015  REFERRING MD :  EDP  CHIEF COMPLAINT:  Abd pain  INITIAL PRESENTATION: 64 year old female with parkinsons dz presented 6/30 with abdominal pain. In ED she was profoundly hypotensive which was not responsive to IVF resuscitation. Lactic acid elevation, started on pressors. PCCM to admit.   STUDIES:  6/30 CT abd > 62mm L UPJ stone with mild dhydronephrosis, Gallbladder sludge vs small stones, peripancreatic fluid. Bilateral lower lobe pulmonary opacities.   SIGNIFICANT EVENTS: 6/30 admitted for presumed sepsis 6/30 left perc nephrostomy tube placed. GNR growing in urine.   Subjective/Overnight:  Remains on pressors but weaning (levo 48mcg), vasopressin.  CVP=8.  Resp status much improved, on RA.  Restless overnight.   VITAL SIGNS: Temp:  [98.1 F (36.7 C)-99 F (37.2 C)] 99 F (37.2 C) (07/03 0834) Pulse Rate:  [69-107] 80 (07/03 0900) Resp:  [20-34] 28 (07/03 0900) BP: (101-119)/(44-76) 109/62 mmHg (07/03 0900) SpO2:  [91 %-100 %] 97 % (07/03 0900) Arterial Line BP: (91-181)/(57-124) 133/66 mmHg (07/03 0900) Weight:  [136 lb 11 oz (62 kg)] 136 lb 11 oz (62 kg) (07/03 0347) HEMODYNAMICS: CVP:  [8 mmHg-14 mmHg] 8 mmHg VENTILATOR SETTINGS:   INTAKE / OUTPUT:  Intake/Output Summary (Last 24 hours) at 01/12/15 1006 Last data filed at 01/12/15 0900  Gross per 24 hour  Intake 3558.28 ml  Output   1095 ml  Net 2463.28 ml    PHYSICAL EXAMINATION: General:  Female of normal body habitus in NAD Neuro:  Drowsy, but more awake, follows some commands  HEENT:  [place]/AT, no JVD noted, PERRL.  Cardiovascular:  RRR, no MRG Lungs:  resps even non labored on 1L, few bibasilar crackles  Abdomen:  Soft, non-tender, non-distended, L nephrostomy with small amt urine  Musculoskeletal:  No acute deformity or ROM limitaiton Skin:  Grossly  intact  LABS:  CBC  Recent Labs Lab 01/10/15 0050 01/11/15 0648 01/12/15 0353  WBC 3.2* 21.4* 26.7*  HGB 14.4 13.6 12.8  HCT 41.9 37.3 36.3  PLT 80* 34* 24*   Coag's  Recent Labs Lab 01/10/15 0050  INR 2.22*   BMET  Recent Labs Lab 01/11/15 0648 01/11/15 1400 01/12/15 0353  NA 132* 135 137  K 4.3 4.5 4.6  CL 107 110 107  CO2 14* 17* 20*  BUN 47* 45* 47*  CREATININE 3.23* 3.23* 3.21*  GLUCOSE 119* 102* 121*   Electrolytes  Recent Labs Lab 01/10/15 0050  01/11/15 0648 01/11/15 1400 01/12/15 0353  CALCIUM 6.9*  < > 7.0* 7.5* 7.8*  MG 1.4*  --  1.8  --   --   PHOS 2.9  --   --   --   --   < > = values in this interval not displayed. Sepsis Markers  Recent Labs Lab 01/10/15 0050 01/10/15 0738 01/11/15 0648 01/11/15 1310 01/12/15 0353  LATICACIDVEN 7.2* 4.1*  --  4.1*  --   PROCALCITON 95.51  81.80  --  97.46  --  50.73   ABG  Recent Labs Lab 01/11/15 0058 01/11/15 1356 01/11/15 2313  PHART 7.351 7.272* 7.409  PCO2ART 26.3* 30.1* 29.8*  PO2ART 71.0* 63.0* 72.0*   Liver Enzymes  Recent Labs Lab 01/09/15 2100 01/10/15 0050 01/11/15 0648  Rogers 42* 49* 54*  ALT 20 23 13*  ALKPHOS 270* 154* 106  BILITOT 1.3* 1.1 1.5*  ALBUMIN  3.2* 2.5* 1.9*   Cardiac Enzymes No results for input(s): TROPONINI, PROBNP in the last 168 hours. Glucose  Recent Labs Lab 01/11/15 1637 01/11/15 1952 01/12/15 0004 01/12/15 0346 01/12/15 0825 01/12/15 0829  GLUCAP 98 107* 115* 117* <10* 116*    Imaging US Abdomen Port  01/11/2015   CLINICAL DATA:  Evaluate gallbladder.  EXAM: ULTRASOUND PORTABLE ABDOMEN  COMPARISON:  CT of 01/09/2015  FINDINGS: Gallbladder: Gallbladder distension at 8.4 cm. Sludge within. Wall thickness upper normal. No pericholecystic fluid. Sonographic Murphy's sign was not elicited.  Common bile duct: Diameter: Upper normal for age, 7 mm. No obstruction on prior CT.  Liver: Mild intrahepatic ductal dilatation within the left lobe. No  focal liver lesion.  IVC: No abnormality visualized.  Pancreas: Visualized portion unremarkable.  Spleen: Size and appearance within normal limits.  Right Kidney: Length: 11.3 cm. No hydronephrosis. Mild increased echogenicity.  Left Kidney: Length: 11.7 cm. Poorly evaluated. No gross hydronephrosis.  Abdominal aorta: No aneurysm visualized.  Other findings: Bilateral pleural effusions.  IMPRESSION: 1. Gallbladder sludge and gallbladder distention, without specific evidence of acute cholecystitis. 2. Upper normal common duct size with mild intrahepatic duct dilatation in the left hepatic lobe. No obstructive mass or stone seen on recent CT. If bilirubin is elevated, MRCP may be informative to exclude otherwise occult obstruction. 3. Small bilateral pleural effusions. 4. Suboptimal evaluation of the left kidney. 5. Increased echogenicity within the right kidney may represent medical renal disease.   Electronically Signed   By: Abigail Miyamoto M.D.   On: 01/11/2015 19:34   Dg Chest Portable 1 View  01/11/2015   CLINICAL DATA:  Hypoxia.  Septic shock.  Pyelonephritis.  EXAM: PORTABLE CHEST - 1 VIEW  COMPARISON:  Chest x-rays dated 01/10/2015, 01/09/2015 and 05/08/2007  FINDINGS: Central line appears in good position at the cavoatrial junction. Increasing bilateral pulmonary infiltrates with new bilateral pleural effusions. The findings consistent with pulmonary edema. The possibility of ARDS should be considered. Heart size is normal. Slight pulmonary vascular prominence.  IMPRESSION: Progressive bilateral pulmonary infiltrates and pleural effusions. Findings are consistent with pulmonary edema.   Electronically Signed   By: Lorriane Shire M.D.   On: 01/11/2015 13:32     ASSESSMENT / PLAN:  PULMONARY A: Bibasilar opacifications on CT concerning for early ALI Hypoxia - improved.  pulm edema on pcxr day prior P:   Supplemental O2 as needed  CXR in AM Incentive spirometry Start lasix   CARDIOVASCULAR CVL  6/30 >>> A:  Septic Shock, likely septic in setting pyelo Lactate clearing  P:  MAP goal > 46mm/Hg Continue gentle volume resuscitation CVP monitoring goal 10 Wean pressors as able - nearly off  F/u lactate  Cont stress steroids  RENAL A:   L UPJ stone and hydronephrosis, s/p Left Perc nephrostomy tube 7/1 AKI/ ATN  AGMA/lactic acidosis  Hypokalemia Hypomagnesemia  P:   Change MIVF to NS  Renal dose meds Replete K and Mg PRN  Follow Bmet Dc HCO3 gtt  Will ultimately need outpt intervention for stone after full coarse abx   GASTROINTESTINAL A:   ? Cholelithiasis Upper abdominal, peripancreatic and periportal fluid on CT >LFTs trending down  Poor PO intake  P:   Clear liquids as able  Consider TF if continues to not adequately take PO's - will hold off one more day, then will likely need panda and TF IV Protonix F/u LFTs intermittently   HEMATOLOGIC A:   Thrombocytopenia - plt trending down 7/3 Coagulopathy  P:  Follow CBC SCD's  F/u INR   INFECTIOUS A:   Severe sepsis secondary to L pyelonephritis  P:   BCx2 6/30: GNR>>>EColi (pan sens)  UC 6/30 >>GNR>>>EColi (pan sens)  CDiff 7/2>>>NEG Ciprofloxacin 6/30 >>>7/2 ceftaz 7/1>>>7/2 Imipenem 7/2>>>  PCT algorithm - concerning but trending down   ENDOCRINE A:     No acute issues P:   Monitor glucose on Chem   NEUROLOGIC A:   Acute metabolic encephalopathy - slowly resolving. At baseline she was ambulatory, driving, only mild dementia.  Hx parkinson's, dementia  P:   RASS goal: 0 Monitor Resume home parkinson's rx as able depending on mental status  Consider image head if no further mental status improvement - hold for now, seems to be improving slowly.    FAMILY  - Updates:  Husband updated at length at bedside 7/2  - Inter-disciplinary family meet or Palliative Care meeting due by:  7/7  Nickolas Madrid, NP 01/12/2015  10:06 AM Pager: [phone] or [phone]  STAFF NOTE: Linwood Dibbles, MD FACP have personally reviewed patient's available data, including medical history, events of note, physical examination and test results as part of my evaluation. I have discussed with resident/NP and other care providers such as pharmacist, RN and RRT. In addition, I personally evaluated patient and elicited key findings of: BP is better, coarse BS, pulm edema on pcxr, Ph is improved, dc bicarb, lasix start, change ABX to ceftriaxone, output from drain is better, bmet in am , unfortunately poor visual changes to left kidney, clinically she is better, high risk post obstructive diuresis, not seen as of now, family updated by me, start diet if SLP approves with parkinsons dz and risk aspiration, repeat pcxr in am, dc vaso also, may need NGt and feeds, tyring to avoid with plat count, fibrinogen noted, lasix also may increase plat, no role Transfusion unless bleeding or less 20 k  The patient is critically ill with multiple organ systems failure and requires high complexity decision making for assessment and support, frequent evaluation and titration of therapies, application of advanced monitoring technologies and extensive interpretation of multiple databases.   Critical Care Time devoted to patient care services described in this note is 30 Minutes. This time reflects time of care of this signee: Merrie Roof, MD FACP. This critical care time does not reflect procedure time, or teaching time or supervisory time of PA/NP/Med student/Med Resident etc but could involve care discussion time. Rest per NP/medical resident whose note is outlined above and that I agree with   Lavon Paganini. Titus Mould, MD, [place] Pgr: [facility] 01/12/2015 11:38 AM

## 2015-01-12 NOTE — Progress Notes (Signed)
CRITICAL VALUE ALERT  Critical value received:  Plt 24  Date of notification:  7-3  Time of notification: 0705  Critical value read back: yes  Nurse who received alert:  Felicia, told in report Josph Macho   MD notified (1st page): Joellen Jersey NP CCM  Time of first page:  0730  MD notified (2nd page):  Time of second page:  Responding MD:    Time MD responded:

## 2015-01-13 ENCOUNTER — Inpatient Hospital Stay (HOSPITAL_COMMUNITY): Payer: BLUE CROSS/BLUE SHIELD

## 2015-01-13 DIAGNOSIS — R40241 Glasgow coma scale score 13-15: Secondary | ICD-10-CM

## 2015-01-13 LAB — GLUCOSE, CAPILLARY
GLUCOSE-CAPILLARY: 84 mg/dL (ref 65–99)
GLUCOSE-CAPILLARY: 92 mg/dL (ref 65–99)
GLUCOSE-CAPILLARY: 112 mg/dL — AB (ref 65–99)
Glucose-Capillary: 100 mg/dL — ABNORMAL HIGH (ref 65–99)
Glucose-Capillary: 106 mg/dL — ABNORMAL HIGH (ref 65–99)
Glucose-Capillary: 78 mg/dL (ref 65–99)

## 2015-01-13 LAB — CBC
HCT: 38.8 % (ref 36.0–46.0)
HEMOGLOBIN: 13.8 g/dL (ref 12.0–15.0)
MCH: 30.2 pg (ref 26.0–34.0)
MCHC: 35.6 g/dL (ref 30.0–36.0)
MCV: 84.9 fL (ref 78.0–100.0)
Platelets: 24 10*3/uL — CL (ref 150–400)
RBC: 4.57 MIL/uL (ref 3.87–5.11)
RDW: 14.2 % (ref 11.5–15.5)
WBC: 42.8 10*3/uL — AB (ref 4.0–10.5)

## 2015-01-13 LAB — COMPREHENSIVE METABOLIC PANEL
ALBUMIN: 2 g/dL — AB (ref 3.5–5.0)
ALT: 8 U/L — ABNORMAL LOW (ref 14–54)
AST: 38 U/L (ref 15–41)
Alkaline Phosphatase: 231 U/L — ABNORMAL HIGH (ref 38–126)
Anion gap: 14 (ref 5–15)
BUN: 69 mg/dL — ABNORMAL HIGH (ref 6–20)
CALCIUM: 8 mg/dL — AB (ref 8.9–10.3)
CHLORIDE: 103 mmol/L (ref 101–111)
CO2: 23 mmol/L (ref 22–32)
Creatinine, Ser: 3.76 mg/dL — ABNORMAL HIGH (ref 0.44–1.00)
GFR calc Af Amer: 14 mL/min — ABNORMAL LOW (ref >60)
GFR, EST NON AFRICAN AMERICAN: 12 mL/min — AB (ref >60)
Glucose, Bld: 85 mg/dL (ref 65–99)
Potassium: 3.8 mmol/L (ref 3.5–5.1)
Sodium: 140 mmol/L (ref 135–145)
Total Bilirubin: 1.2 mg/dL (ref 0.3–1.2)
Total Protein: 4.8 g/dL — ABNORMAL LOW (ref 6.5–8.1)

## 2015-01-13 LAB — CULTURE, BLOOD (ROUTINE X 2)

## 2015-01-13 LAB — PROCALCITONIN: Procalcitonin: 34.15 ng/mL

## 2015-01-13 NOTE — Progress Notes (Signed)
PULMONARY / CRITICAL CARE MEDICINE   Name: Becky Rogers MRN: [id number] DOB: Sep 27, 1950    ADMISSION DATE:  01/09/2015 CONSULTATION DATE:  01/09/2015  REFERRING MD :  EDP  CHIEF COMPLAINT:  Abd pain  INITIAL PRESENTATION: 64 year old female with parkinsons dz presented 6/30 with abdominal pain. In ED she was profoundly hypotensive which was not responsive to IVF resuscitation. Lactic acid elevation, started on pressors. PCCM to admit.   STUDIES:  6/30 CT abd > 4mm L UPJ stone with mild dhydronephrosis, Gallbladder sludge vs small stones, peripancreatic fluid. Bilateral lower lobe pulmonary opacities.  7/3 CT head> no intracranial abnormalities 7/4 CXR > Pulmonary Edema with Bilateral Pleural Effusion  SIGNIFICANT EVENTS: 6/30 admitted for presumed sepsis 6/30 left perc nephrostomy tube placed. GNR growing in urine.  7/3 weaned from Pressors  Subjective/Overnight:  Much more alert today. Continues not note pain in fingers and toes. Erythema and mottled appearance appear to be improving, but there is now new dusky tone to distal aspects of toes that family notes was not there before.  VITAL SIGNS: Temp:  [98 F (36.7 C)-99 F (37.2 C)] 98.6 F (37 C) (07/04 0751) Pulse Rate:  [66-85] 78 (07/04 0700) Resp:  [16-29] 17 (07/04 0700) BP: (83-113)/(50-66) 103/64 mmHg (07/04 0700) SpO2:  [95 %-100 %] 100 % (07/04 0700) Arterial Line BP: (105-138)/(52-70) 115/59 mmHg (07/03 1700) Weight:  [130 lb 11.7 oz (59.3 kg)] 130 lb 11.7 oz (59.3 kg) (07/04 0400) HEMODYNAMICS: CVP:  [5 mmHg-10 mmHg] 10 mmHg VENTILATOR SETTINGS:   INTAKE / OUTPUT:  Intake/Output Summary (Last 24 hours) at 01/13/15 0818 Last data filed at 01/13/15 0700  Gross per 24 hour  Intake  799.8 ml  Output   1770 ml  Net -970.2 ml    PHYSICAL EXAMINATION: General:  Female of normal body habitus in NAD Neuro:  Drowsy, but more awake, follows some commands  HEENT:  [place]/AT, no JVD noted, PERRL.  Cardiovascular:   RRR, no MRG, CVC line in place Lungs:  CTAB, no wheezing, no increased work of breathing Abdomen:  Soft, diffusely tender, non-distended, L nephrostomy with small amt urine  Musculoskeletal:  No acute deformity or ROM limitaiton Skin:  Grossly intact  LABS:  CBC  Recent Labs Lab 01/11/15 0648 01/12/15 0353 01/13/15 0400  WBC 21.4* 26.7* 42.8*  HGB 13.6 12.8 13.8  HCT 37.3 36.3 38.8  PLT 34* 24* 24*   Coag's  Recent Labs Lab 01/10/15 0050 01/12/15 1146  INR 2.22* 1.36   BMET  Recent Labs Lab 01/11/15 1400 01/12/15 0353 01/13/15 0400  NA 135 137 140  K 4.5 4.6 3.8  CL 110 107 103  CO2 17* 20* 23  BUN 45* 47* 69*  CREATININE 3.23* 3.21* 3.76*  GLUCOSE 102* 121* 85   Electrolytes  Recent Labs Lab 01/10/15 0050  01/11/15 0648 01/11/15 1400 01/12/15 0353 01/13/15 0400  CALCIUM 6.9*  < > 7.0* 7.5* 7.8* 8.0*  MG 1.4*  --  1.8  --   --   --   PHOS 2.9  --   --   --   --   --   < > = values in this interval not displayed. Sepsis Markers  Recent Labs Lab 01/10/15 0738 01/11/15 0648 01/11/15 1310 01/12/15 0353 01/12/15 1108 01/13/15 0400  LATICACIDVEN 4.1*  --  4.1*  --  2.9*  --   PROCALCITON  --  97.46  --  50.73  --  34.15   ABG  Recent  Labs Lab 01/11/15 1356 01/11/15 2313 01/12/15 1225  PHART 7.272* 7.409 7.448  PCO2ART 30.1* 29.8* 27.5*  PO2ART 63.0* 72.0* 52.0*   Liver Enzymes  Recent Labs Lab 01/10/15 0050 01/11/15 0648 01/13/15 0400  AST 49* 54* 38  ALT 23 13* 8*  ALKPHOS 154* 106 231*  BILITOT 1.1 1.5* 1.2  ALBUMIN 2.5* 1.9* 2.0*   Cardiac Enzymes No results for input(s): TROPONINI, PROBNP in the last 168 hours. Glucose  Recent Labs Lab 01/12/15 1129 01/12/15 1720 01/12/15 1949 01/12/15 2011 01/12/15 2347 01/13/15 0342  GLUCAP 92 97 <10* 74 106* 84    Imaging Ct Head Wo Contrast  01/12/2015   CLINICAL DATA:  Altered mental status, sepsis.  EXAM: CT HEAD WITHOUT CONTRAST  TECHNIQUE: Contiguous axial images were  obtained from the base of the skull through the vertex without intravenous contrast.  COMPARISON:  None.  FINDINGS: No acute intracranial abnormality. Specifically, no hemorrhage, hydrocephalus, mass lesion, acute infarction, or significant intracranial injury. No acute calvarial abnormality. Visualized paranasal sinuses and mastoids clear. Orbital soft tissues unremarkable.  IMPRESSION: No acute intracranial abnormality.   Electronically Signed   By: Rolm Baptise M.D.   On: 01/12/2015 13:09   Dg Chest Port 1 View  01/13/2015   CLINICAL DATA:  Pulmonary infiltrate  EXAM: PORTABLE CHEST - 1 VIEW  COMPARISON:  01/11/2015  FINDINGS: Stable positioning of left IJ central line, tip at the upper cavoatrial junction.  Stable borderline cardiomegaly.  Negative aortic and hilar contours.  Increased hazy density over the lower chest compatible with pleural fluid. The change could reflect increase or differential layering. Diffuse interstitial opacities compatible with edema and confirmed on abdominal CT 01/09/2015.  IMPRESSION: Pulmonary edema with bilateral pleural effusion. Left pleural fluid could have increased or layered.   Electronically Signed   By: Monte Fantasia M.D.   On: 01/13/2015 06:12    ASSESSMENT / PLAN:  PULMONARY A: Bibasilar opacifications on CT concerning for early ALI Hypoxia - resolved  Pulmonary edema with bilateral pleural effusion ALI ?  P:   Supplemental O2 as needed  Incentive spirometry Lasix hold further with crt rise   CARDIOVASCULAR CVL 6/30 >>>  A:  Septic Shock, likely septic in setting pyelo Lactate clearing   P:  MAP goal > 73mm/Hg Dc line neck Fluids KVO CVP monitoring not needed Weaned from pressors 7/3 Cont stress steroids  RENAL A:   L UPJ stone and hydronephrosis, s/p Left Perc nephrostomy tube 7/1 AKI/ ATN  AGMA/lactic acidosis  Hypokalemia--Resolved Hypomagnesemia--Resolved At risk post obstructive diureiss  P:   KVO Renal dose meds Dc  lasix Follow [facility] following  GASTROINTESTINAL A:   Possible Cholelithiasis Upper abdominal, peripancreatic and periportal fluid on CT >LFTs trending down  Poor PO intake   P:   Clear liquids as able  Consider TF if continues to not adequately take PO's - will hold off one more day, then will likely need panda and TF Speech Evaluation today. Initiate diet pending recommendations. IV Protonix F/u LFTs intermittently   HEMATOLOGIC A:   Thrombocytopenia  Coagulopathy  hemoconcentration ( wbc, hct)  P:  Follow CBC SCD's  F/u INR   INFECTIOUS A:   Severe sepsis secondary to L pyelonephritis   P:   BCx2 6/30: GNR>>>EColi (pan sens)  UC 6/30 >>GNR>>>EColi (pan sens)  CDiff 7/2>>>NEG Ciprofloxacin 6/30 >>>7/2 ceftaz 7/1>>>7/2 Imipenem 7/2>>>7/3 CTX 7/3 >>>  PCT algorithm - concerning but trending down  Consider 10 days abx  ENDOCRINE A:  No acute issues  P:   Monitor glucose on Chem  NEUROLOGIC A:   Acute metabolic encephalopathy - slowly resolving. At baseline she was ambulatory, driving, only mild dementia.  Hx parkinson's, dementia   P:   RASS goal: 0 Monitor Home medications restarted: Sinemet, Artane  FAMILY  - Updates:  Husband updated at length at bedside 7/2 - Inter-disciplinary family meet or Palliative Care meeting due by:  7/7  Junie Panning, DO Family Medicine, PGY-2  01/13/2015  8:18 AM   STAFF NOTE: Linwood Dibbles, MD FACP have personally reviewed patient's available data, including medical history, events of note, physical examination and test results as part of my evaluation. I have discussed with resident/NP and other care providers such as pharmacist, RN and RRT. In addition, I personally evaluated patient and elicited key findings of: IMproved neurostatus, ct neg, continued antipark meds, dc line as able, dc lasix, observe trend crt, consider to sdu, no role plat transfuasion, add stop date consider 10 days abx, urology  following, SLP today, PT consult  Lavon Paganini. Titus Mould, MD, [place] Pgr: [facility] 01/13/2015 9:56 AM

## 2015-01-13 NOTE — Evaluation (Signed)
Clinical/Bedside Swallow Evaluation Patient Details  Name: Becky Rogers MRN: [id number] Date of Birth: 25-Nov-1950  Today's Date: 01/13/2015 Time: SLP Start Time (ACUTE ONLY): 1057 SLP Stop Time (ACUTE ONLY): 1116 SLP Time Calculation (min) (ACUTE ONLY): 19 min  Past Medical History:  Past Medical History  Diagnosis Date  . Hypertension   . Hypercholesteremia   . Parkinson disease    Past Surgical History:  Past Surgical History  Procedure Laterality Date  . Cesarean section      x2  . Ovarian cyst removal      age 13  . Appendectomy    . Colonoscopy    . Dental surgery      implant  . Breast lumpectomy with radioactive seed localization Right 08/28/2014    Procedure: RIGHT BREAST LUMPECTOMY WITH RADIOACTIVE SEED LOCALIZATION;  Surgeon: [name], MD;  Location: [facility];  Service: General;  Laterality: Right;   HPI:  64 yo female wtih parkinsons dz presented 6/30 with abdominal pain.   Assessment / Plan / Recommendation Clinical Impression  Pt has a mild cognitively-based dysphagia due to altered mentation, with decreased oral acceptance and awareness of POs. With Mod cues, she is able to take in both solids and liquids with adequate oral manipulation. Occasional, delayed throat clears are noted after solid intake, although swallow initiation appears timely and hyolaryngeal movement adequate. Recommend initiation of Dys 2 diet and thin liquids with esophageal and aspiration precautions. Will f/u for tolerance - prognosis for advancement good as mentation continuse to improve.    Aspiration Risk  Mild    Diet Recommendation Dysphagia 2 (Fine chop);Thin   Medication Administration: Whole meds with puree Compensations: Slow rate;Small sips/bites;Follow solids with liquid    Other  Recommendations Oral Care Recommendations: Oral care BID   Follow Up Recommendations       Frequency and Duration min 2x/week  2 weeks   Pertinent Vitals/Pain [facility]     SLP Swallow Goals     Swallow Study Prior Functional Status       General Date of Onset: 01/09/15 Other Pertinent Information: 64 yo female wtih parkinsons dz presented 6/30 with abdominal pain. Type of Study: Bedside swallow evaluation Previous Swallow Assessment: none in chart Diet Prior to this Study: NPO Temperature Spikes Noted: No Respiratory Status: Room air History of Recent Intubation: No Behavior/Cognition: Alert;Cooperative;Confused;Requires cueing Oral Cavity - Dentition: Adequate natural dentition/normal for age Self-Feeding Abilities: Needs assist Patient Positioning: Upright in bed Baseline Vocal Quality: Normal    Oral/Motor/Sensory Function Overall Oral Motor/Sensory Function: Appears within functional limits for tasks assessed (as observed within functional tasks)   [employer] chips:  (pt orally expectorated)   Thin Liquid Thin Liquid: Impaired Presentation: Self Fed;Straw Oral Phase Impairments: Poor awareness of bolus    Nectar Thick Nectar Thick Liquid: Not tested   Honey Thick Honey Thick Liquid: Not tested   Puree Puree: Impaired Presentation: Spoon Oral Phase Impairments: Poor awareness of bolus Pharyngeal Phase Impairments: Throat Clearing - Delayed   Solid    Solid: Impaired Oral Phase Impairments: Poor awareness of bolus Pharyngeal Phase Impairments: Throat Clearing - Delayed      Germain Osgood, M.A. CCC-SLP [phone]  Germain Osgood 01/13/2015,11:29 AM

## 2015-01-13 NOTE — Progress Notes (Signed)
Subjective: Patient more alert this am.Does not seem to  Be in pain.  Objective: Vital signs in last 24 hours: Temp:  [98 F (36.7 C)-99 F (37.2 C)] 98.6 F (37 C) (07/04 0343) Pulse Rate:  [66-85] 78 (07/04 0700) Resp:  [16-29] 17 (07/04 0700) BP: (83-115)/(50-66) 103/64 mmHg (07/04 0700) SpO2:  [95 %-100 %] 100 % (07/04 0700) Arterial Line BP: (105-138)/(52-72) 115/59 mmHg (07/03 1700) Weight:  [59.3 kg (130 lb 11.7 oz)] 59.3 kg (130 lb 11.7 oz) (07/04 0400)  Intake/Output from previous day: 07/03 0701 - 07/04 0700 In: 962 [I.V.:647; IV Piggyback:50] Out: 1900 [Urine:1900] Intake/Output this shift:    Physical Exam:  Constitutional: Vital signs reviewed. WD WN in NAD   Eyes: PERRL, No scleral icterus.   Pulmonary/Chest: Normal effort Abdominal: Soft. Non-tender, non-distended, bowel sounds are normal, no masses, organomegaly, or guarding present.  Extremities: No cyanosis or edema   Lab Results:  Recent Labs  01/11/15 0648 01/12/15 0353 01/13/15 0400  HGB 13.6 12.8 13.8  HCT 37.3 36.3 38.8   BMET  Recent Labs  01/12/15 0353 01/13/15 0400  NA 137 140  K 4.6 3.8  CL 107 103  CO2 20* 23  GLUCOSE 121* 85  BUN 47* 69*  CREATININE 3.21* 3.76*  CALCIUM 7.8* 8.0*    Recent Labs  01/12/15 1146  INR 1.36   No results for input(s): LABURIN in the last 72 hours. Results for orders placed or performed during the hospital encounter of 01/09/15  Blood culture (routine x 2)     Status: None   Collection Time: 01/09/15  9:27 PM  Result Value Ref Range Status   Specimen Description BLOOD RIGHT ARM  Final   Special Requests BOTTLES DRAWN AEROBIC AND ANAEROBIC 5CC  Final   Culture  Setup Time   Final    GRAM NEGATIVE RODS IN BOTH AEROBIC AND ANAEROBIC BOTTLES CRITICAL RESULT CALLED TO, READ BACK BY AND VERIFIED WITH: Mariea Clonts, RN AT [place] ON [id number] BY S. YARBROUGH    Culture ESCHERICHIA COLI  Final   Report Status 01/12/2015 FINAL  Final   Organism ID,  Bacteria ESCHERICHIA COLI  Final      Susceptibility   Escherichia coli - MIC*    AMPICILLIN 8 SENSITIVE Sensitive     CEFAZOLIN <=4 SENSITIVE Sensitive     CEFEPIME <=1 SENSITIVE Sensitive     CEFTAZIDIME <=1 SENSITIVE Sensitive     CEFTRIAXONE <=1 SENSITIVE Sensitive     CIPROFLOXACIN <=0.25 SENSITIVE Sensitive     GENTAMICIN <=1 SENSITIVE Sensitive     IMIPENEM <=0.25 SENSITIVE Sensitive     TRIMETH/SULFA <=20 SENSITIVE Sensitive     AMPICILLIN/SULBACTAM 4 SENSITIVE Sensitive     PIP/TAZO <=4 SENSITIVE Sensitive     * ESCHERICHIA COLI  Blood culture (routine x 2)     Status: None (Preliminary result)   Collection Time: 01/09/15  9:34 PM  Result Value Ref Range Status   Specimen Description BLOOD RIGHT HAND  Final   Special Requests BOTTLES DRAWN AEROBIC ONLY 3CC  Final   Culture  Setup Time   Final    GRAM NEGATIVE RODS AEROBIC BOTTLE ONLY CRITICAL RESULT CALLED TO, READ BACK BY AND VERIFIED WITH: T HARVEY 01/10/15 @ 77 M VESTAL    Culture   Final    ESCHERICHIA COLI SUSCEPTIBILITIES PERFORMED ON PREVIOUS CULTURE WITHIN THE LAST 5 DAYS.    Report Status PENDING  Incomplete  MRSA PCR Screening     Status: None  Collection Time: 01/09/15 11:52 PM  Result Value Ref Range Status   MRSA by PCR NEGATIVE NEGATIVE Final    Comment:        The GeneXpert MRSA Assay (FDA approved for NASAL specimens only), is one component of a comprehensive MRSA colonization surveillance program. It is not intended to diagnose MRSA infection nor to guide or monitor treatment for MRSA infections.   Urine culture     Status: None   Collection Time: 01/10/15  1:46 AM  Result Value Ref Range Status   Specimen Description URINE, CATHETERIZED  Final   Special Requests NONE  Final   Culture >=100,000 COLONIES/mL ESCHERICHIA COLI  Final   Report Status 01/12/2015 FINAL  Final   Organism ID, Bacteria ESCHERICHIA COLI  Final      Susceptibility   Escherichia coli - MIC*    AMPICILLIN 8  SENSITIVE Sensitive     CEFAZOLIN <=4 SENSITIVE Sensitive     CEFTRIAXONE <=1 SENSITIVE Sensitive     CIPROFLOXACIN <=0.25 SENSITIVE Sensitive     GENTAMICIN <=1 SENSITIVE Sensitive     IMIPENEM <=0.25 SENSITIVE Sensitive     NITROFURANTOIN <=16 SENSITIVE Sensitive     TRIMETH/SULFA <=20 SENSITIVE Sensitive     AMPICILLIN/SULBACTAM 4 SENSITIVE Sensitive     PIP/TAZO <=4 SENSITIVE Sensitive     * >=100,000 COLONIES/mL ESCHERICHIA COLI  Clostridium Difficile by PCR (not at [facility])     Status: None   Collection Time: 01/11/15 11:08 AM  Result Value Ref Range Status   C difficile by pcr NEGATIVE NEGATIVE Final    Studies/Results: Ct Head Wo Contrast  01/12/2015   CLINICAL DATA:  Altered mental status, sepsis.  EXAM: CT HEAD WITHOUT CONTRAST  TECHNIQUE: Contiguous axial images were obtained from the base of the skull through the vertex without intravenous contrast.  COMPARISON:  None.  FINDINGS: No acute intracranial abnormality. Specifically, no hemorrhage, hydrocephalus, mass lesion, acute infarction, or significant intracranial injury. No acute calvarial abnormality. Visualized paranasal sinuses and mastoids clear. Orbital soft tissues unremarkable.  IMPRESSION: No acute intracranial abnormality.   Electronically Signed   By: Rolm Baptise M.D.   On: 01/12/2015 13:09   US Abdomen Port  01/11/2015   CLINICAL DATA:  Evaluate gallbladder.  EXAM: ULTRASOUND PORTABLE ABDOMEN  COMPARISON:  CT of 01/09/2015  FINDINGS: Gallbladder: Gallbladder distension at 8.4 cm. Sludge within. Wall thickness upper normal. No pericholecystic fluid. Sonographic Murphy's sign was not elicited.  Common bile duct: Diameter: Upper normal for age, 7 mm. No obstruction on prior CT.  Liver: Mild intrahepatic ductal dilatation within the left lobe. No focal liver lesion.  IVC: No abnormality visualized.  Pancreas: Visualized portion unremarkable.  Spleen: Size and appearance within normal limits.  Right Kidney: Length: 11.3 cm. No  hydronephrosis. Mild increased echogenicity.  Left Kidney: Length: 11.7 cm. Poorly evaluated. No gross hydronephrosis.  Abdominal aorta: No aneurysm visualized.  Other findings: Bilateral pleural effusions.  IMPRESSION: 1. Gallbladder sludge and gallbladder distention, without specific evidence of acute cholecystitis. 2. Upper normal common duct size with mild intrahepatic duct dilatation in the left hepatic lobe. No obstructive mass or stone seen on recent CT. If bilirubin is elevated, MRCP may be informative to exclude otherwise occult obstruction. 3. Small bilateral pleural effusions. 4. Suboptimal evaluation of the left kidney. 5. Increased echogenicity within the right kidney may represent medical renal disease.   Electronically Signed   By: Abigail Miyamoto M.D.   On: 01/11/2015 19:34   Dg Chest Port 1  View  01/13/2015   CLINICAL DATA:  Pulmonary infiltrate  EXAM: PORTABLE CHEST - 1 VIEW  COMPARISON:  01/11/2015  FINDINGS: Stable positioning of left IJ central line, tip at the upper cavoatrial junction.  Stable borderline cardiomegaly.  Negative aortic and hilar contours.  Increased hazy density over the lower chest compatible with pleural fluid. The change could reflect increase or differential layering. Diffuse interstitial opacities compatible with edema and confirmed on abdominal CT 01/09/2015.  IMPRESSION: Pulmonary edema with bilateral pleural effusion. Left pleural fluid could have increased or layered.   Electronically Signed   By: Monte Fantasia M.D.   On: 01/13/2015 06:12   Dg Chest Portable 1 View  01/11/2015   CLINICAL DATA:  Hypoxia.  Septic shock.  Pyelonephritis.  EXAM: PORTABLE CHEST - 1 VIEW  COMPARISON:  Chest x-rays dated 01/10/2015, 01/09/2015 and 05/08/2007  FINDINGS: Central line appears in good position at the cavoatrial junction. Increasing bilateral pulmonary infiltrates with new bilateral pleural effusions. The findings consistent with pulmonary edema. The possibility of ARDS should  be considered. Heart size is normal. Slight pulmonary vascular prominence.  IMPRESSION: Progressive bilateral pulmonary infiltrates and pleural effusions. Findings are consistent with pulmonary edema.   Electronically Signed   By: Lorriane Shire M.D.   On: 01/11/2015 13:32    Assessment/Plan:   Infected stone s/p PCN tube--good UOP but Cr and WBC increasing ? etiol. On appropriate abx for pansensitive E.coli.  Defer further mgmt to CCM. Will ask nursing to fractionate UOP--bladder and pcn tube.   LOS: 4 days   Franchot Gallo M 01/13/2015, 7:04 AM

## 2015-01-13 NOTE — Evaluation (Signed)
Physical Therapy Evaluation Patient Details Name: Becky Rogers MRN: [id number] DOB: [birth year] Today's Date: 01/13/2015   History of Present Illness  64 year old female with parkinsons dz presented 6/30 with abdominal pain. In ED she was profoundly hypotensive which was not responsive to IVF resuscitation. Lactic acid elevation, started on pressors. AMS noted.  Clinical Impression  Patient demonstrates deficits in functional mobility as indicated below. Will need continued skilled PT to address deficits and maximize function. Will see as indicated and progress as tolerated. Patient with significant deficits in mentation at this time.  OF NOTE: patient VSS, but patient with severe discoloration in toes on BLE, patient with quick response to pain with withdrawal, moaning, and grimacing right foot.    Follow Up Recommendations CIR;Supervision/Assistance - 24 hour    Equipment Recommendations  None recommended by PT    Recommendations for Other Services Rehab consult     Precautions / Restrictions Precautions Precautions: Fall Restrictions Weight Bearing Restrictions: No      Mobility  Bed Mobility Overal bed mobility: Needs Assistance;+2 for physical assistance Bed Mobility: Supine to Sit     Supine to sit: Mod assist;+2 for physical assistance     General bed mobility comments: unable to problem solve or sequence through tasks to come to EOB, assist provided  Transfers Overall transfer level: Needs assistance Equipment used: 2 person hand held assist Transfers: Sit to/from Stand;Stand Pivot Transfers Sit to Stand: Mod assist;+2 physical assistance Stand pivot transfers: Mod assist;+2 physical assistance       General transfer comment: assist to power up, patient with some shuffling steps to chair but continued to require cues and assist  Ambulation/Gait                Stairs            Wheelchair Mobility    Modified Rankin (Stroke Patients Only)        Balance Overall balance assessment: Needs assistance Sitting-balance support: Feet supported Sitting balance-Leahy Scale: Fair     Standing balance support: During functional activity Standing balance-Leahy Scale: Poor                               Pertinent Vitals/Pain Pain Assessment: Faces Faces Pain Scale: Hurts whole lot Pain Location: right foot Pain Descriptors / Indicators: Grimacing;Guarding (withdrawl to touch) Pain Intervention(s): Monitored during session;Limited activity within patient's tolerance;Repositioned;Relaxation    Home Living Family/patient expects to be discharged to:: Private residence Living Arrangements: Spouse/significant other               Additional Comments: patient with AMS unable to provide history, per review of chart and staff communication, patient was independent prior to admission    Prior Function Level of Independence: Independent               Hand Dominance        Extremity/Trunk Assessment               Lower Extremity Assessment: RLE deficits/detail;LLE deficits/detail;Difficult to assess due to impaired cognition RLE Deficits / Details: patient with discoloration on toes1st2nd 4th and 5th LLE Deficits / Details: patient with discoloration on toes(all toes, with bumpbs noted on great toe)     Communication   Communication: Expressive difficulties  Cognition Arousal/Alertness: Awake/alert Behavior During Therapy: Flat affect Overall Cognitive Status: Impaired/Different from baseline Area of Impairment: Orientation;Attention;Memory;Following commands;Safety/judgement;Awareness;Problem solving Orientation Level: Disoriented to;Place;Time;Situation Current Attention Level:  Focused Memory: Decreased short-term memory Following Commands: Follows one step commands inconsistently;Follows one step commands with increased time Safety/Judgement: Decreased awareness of safety;Decreased awareness of  deficits Awareness: Intellectual Problem Solving: Slow processing;Decreased initiation;Difficulty sequencing;Requires verbal cues;Requires tactile cues General Comments: Patient able to initiate some commands but unable to carry out, patient with word finding difficulties and question some aspects of confabulation (pt reports she is here because she broke her ankle)    General Comments General comments (skin integrity, edema, etc.): noted discoloration on toes bilaterally, feet cold to touch    Exercises        Assessment/Plan    PT Assessment Patient needs continued PT services  PT Diagnosis Difficulty walking;Altered mental status   PT Problem List Decreased strength;Decreased range of motion;Decreased activity tolerance;Decreased balance;Decreased mobility;Decreased coordination;Decreased cognition;Decreased safety awareness;Decreased skin integrity;Pain  PT Treatment Interventions DME instruction;Gait training;Stair training;Functional mobility training;Therapeutic activities;Therapeutic exercise;Balance training;Cognitive remediation;Patient/family education   PT Goals (Current goals can be found in the Care Plan section) Acute Rehab PT Goals Patient Stated Goal: none stated PT Goal Formulation: Patient unable to participate in goal setting Time For Goal Achievement: 01/27/15 Potential to Achieve Goals: Good    Frequency Min 3X/week   Barriers to discharge        Co-evaluation               End of Session   Activity Tolerance: Patient limited by fatigue Patient left: in chair;with call bell/phone within reach Nurse Communication: Mobility status         Time: 5374-8270 PT Time Calculation (min) (ACUTE ONLY): 18 min   Charges:   PT Evaluation $Initial PT Evaluation Tier I: 1 Procedure     PT G CodesDuncan Dull 01-26-15, 4:53 PM Alben Deeds, [place] DPT  [phone]

## 2015-01-14 ENCOUNTER — Inpatient Hospital Stay (HOSPITAL_COMMUNITY): Payer: BLUE CROSS/BLUE SHIELD

## 2015-01-14 ENCOUNTER — Encounter (HOSPITAL_COMMUNITY): Payer: Self-pay | Admitting: Physical Medicine and Rehabilitation

## 2015-01-14 DIAGNOSIS — G20 Parkinson's disease: Secondary | ICD-10-CM

## 2015-01-14 DIAGNOSIS — G9340 Encephalopathy, unspecified: Secondary | ICD-10-CM

## 2015-01-14 DIAGNOSIS — R5381 Other malaise: Secondary | ICD-10-CM

## 2015-01-14 LAB — CBC
HCT: 37.8 % (ref 36.0–46.0)
HEMOGLOBIN: 13.2 g/dL (ref 12.0–15.0)
MCH: 29.7 pg (ref 26.0–34.0)
MCHC: 34.9 g/dL (ref 30.0–36.0)
MCV: 84.9 fL (ref 78.0–100.0)
Platelets: 29 10*3/uL — CL (ref 150–400)
RBC: 4.45 MIL/uL (ref 3.87–5.11)
RDW: 14.3 % (ref 11.5–15.5)
WBC: 55.3 10*3/uL (ref 4.0–10.5)

## 2015-01-14 LAB — LACTIC ACID, PLASMA: Lactic Acid, Venous: 2.6 mmol/L (ref 0.5–2.0)

## 2015-01-14 LAB — BASIC METABOLIC PANEL
Anion gap: 12 (ref 5–15)
BUN: 91 mg/dL — AB (ref 6–20)
CHLORIDE: 104 mmol/L (ref 101–111)
CO2: 24 mmol/L (ref 22–32)
CREATININE: 3.43 mg/dL — AB (ref 0.44–1.00)
Calcium: 7.9 mg/dL — ABNORMAL LOW (ref 8.9–10.3)
GFR calc non Af Amer: 13 mL/min — ABNORMAL LOW (ref >60)
GFR, EST AFRICAN AMERICAN: 15 mL/min — AB (ref >60)
Glucose, Bld: 112 mg/dL — ABNORMAL HIGH (ref 65–99)
Potassium: 3.7 mmol/L (ref 3.5–5.1)
Sodium: 140 mmol/L (ref 135–145)

## 2015-01-14 LAB — GLUCOSE, CAPILLARY
GLUCOSE-CAPILLARY: 101 mg/dL — AB (ref 65–99)
GLUCOSE-CAPILLARY: 115 mg/dL — AB (ref 65–99)
Glucose-Capillary: 99 mg/dL (ref 65–99)

## 2015-01-14 LAB — PROCALCITONIN: Procalcitonin: 11.19 ng/mL

## 2015-01-14 MED ORDER — FENTANYL CITRATE (PF) 100 MCG/2ML IJ SOLN: 25.0000 ug | INTRAMUSCULAR | Status: DC | PRN

## 2015-01-14 NOTE — Progress Notes (Signed)
Speech Language Pathology Treatment: Dysphagia  Patient Details Name: Becky Rogers MRN: [id number] DOB: 10-19-1950 Today's Date: 01/14/2015 Time: 6286-3817 SLP Time Calculation (min) (ACUTE ONLY): 26 min  Assessment / Plan / Recommendation Clinical Impression  Assisted with breakfast meal with dysphagia intervention. Required moderate cues initially for sustained attention on SLP/meal. Mild lingual residue with Dys 2 (eggs) cleared with min prompts and supervision. Masticated solid (for possible upgrade) with adequate oral prep and transit. Consistent throat clear with solids and liquids throughout meal; no coughs or wet vocal quality. Moderate cues for small sips with straw. Continues to be afebrile and lungs reamin stable per RN documentation. Upgraded solids to Dys 3. Continue skilled ST treatment.   HPI Other Pertinent Information: 64 yo female wtih parkinsons dz presented 6/30 with abdominal pain.   Pertinent Vitals Pain Assessment: No/denies pain  SLP Plan  Continue with current plan of care    Recommendations Diet recommendations: Dysphagia 3 (mechanical soft);Thin liquid Liquids provided via: Cup;Straw Medication Administration: Whole meds with puree Supervision: Patient able to self feed;Intermittent supervision to cue for compensatory strategies Compensations: Slow rate;Small sips/bites Postural Changes and/or Swallow Maneuvers: Seated upright 90 degrees              Oral Care Recommendations: Oral care BID Follow up Recommendations:  (TBD) Plan: Continue with current plan of care    GO     Houston Siren 01/14/2015, 9:45 AM  Orbie Pyo Colvin Caroli.Ed [employer] [phone]

## 2015-01-14 NOTE — Progress Notes (Addendum)
ANTIBIOTIC CONSULT NOTE  Pharmacy Consult for Ceftriaxone Indication: pyelonephritis, Ecoli bacteremia  No Known Allergies  Patient Measurements: Height: 5\' 2"  (157.5 cm) Weight: 129 lb 10.1 oz (58.8 kg) IBW/kg (Calculated) : 50.1  Vital Signs: Temp: 98.8 F (37.1 C) (07/05 0000) Temp Source: Oral (07/05 0000) BP: 99/52 mmHg (07/05 0200) Pulse Rate: 64 (07/05 0200) Intake/Output from previous day: 07/04 0701 - 07/05 0700 In: 505 [P.O.:360; IV Piggyback:50] Out: 1950 [Urine:1605] Intake/Output from this shift:    Labs:  Recent Labs  01/12/15 0353 01/13/15 0400 01/14/15 0245  WBC 26.7* 42.8* 55.3*  HGB 12.8 13.8 13.2  PLT 24* 24* 29*  CREATININE 3.21* 3.76* 3.43*   Estimated Creatinine Clearance: 13.1 mL/min (by C-G formula based on Cr of 3.43). No results for input(s): VANCOTROUGH, VANCOPEAK, VANCORANDOM, GENTTROUGH, GENTPEAK, GENTRANDOM, TOBRATROUGH, TOBRAPEAK, TOBRARND, AMIKACINPEAK, AMIKACINTROU, AMIKACIN in the last 72 hours.   Microbiology: Recent Results (from the past 720 hour(s))  Blood culture (routine x 2)     Status: None   Collection Time: 01/09/15  9:27 PM  Result Value Ref Range Status   Specimen Description BLOOD RIGHT ARM  Final   Special Requests BOTTLES DRAWN AEROBIC AND ANAEROBIC 5CC  Final   Culture  Setup Time   Final    GRAM NEGATIVE RODS IN BOTH AEROBIC AND ANAEROBIC BOTTLES CRITICAL RESULT CALLED TO, READ BACK BY AND VERIFIED WITH: Mariea Clonts, RN AT [place] ON [id number] BY S. YARBROUGH    Culture ESCHERICHIA COLI  Final   Report Status 01/12/2015 FINAL  Final   Organism ID, Bacteria ESCHERICHIA COLI  Final      Susceptibility   Escherichia coli - MIC*    AMPICILLIN 8 SENSITIVE Sensitive     CEFAZOLIN <=4 SENSITIVE Sensitive     CEFEPIME <=1 SENSITIVE Sensitive     CEFTAZIDIME <=1 SENSITIVE Sensitive     CEFTRIAXONE <=1 SENSITIVE Sensitive     CIPROFLOXACIN <=0.25 SENSITIVE Sensitive     GENTAMICIN <=1 SENSITIVE Sensitive     IMIPENEM  <=0.25 SENSITIVE Sensitive     TRIMETH/SULFA <=20 SENSITIVE Sensitive     AMPICILLIN/SULBACTAM 4 SENSITIVE Sensitive     PIP/TAZO <=4 SENSITIVE Sensitive     * ESCHERICHIA COLI  Blood culture (routine x 2)     Status: None   Collection Time: 01/09/15  9:34 PM  Result Value Ref Range Status   Specimen Description BLOOD RIGHT HAND  Final   Special Requests BOTTLES DRAWN AEROBIC ONLY 3CC  Final   Culture  Setup Time   Final    GRAM NEGATIVE RODS AEROBIC BOTTLE ONLY CRITICAL RESULT CALLED TO, READ BACK BY AND VERIFIED WITH: T HARVEY 01/10/15 @ 72 M VESTAL    Culture   Final    ESCHERICHIA COLI SUSCEPTIBILITIES PERFORMED ON PREVIOUS CULTURE WITHIN THE LAST 5 DAYS.    Report Status 01/13/2015 FINAL  Final  MRSA PCR Screening     Status: None   Collection Time: 01/09/15 11:52 PM  Result Value Ref Range Status   MRSA by PCR NEGATIVE NEGATIVE Final    Comment:        The GeneXpert MRSA Assay (FDA approved for NASAL specimens only), is one component of a comprehensive MRSA colonization surveillance program. It is not intended to diagnose MRSA infection nor to guide or monitor treatment for MRSA infections.   Urine culture     Status: None   Collection Time: 01/10/15  1:46 AM  Result Value Ref Range Status   Specimen Description  URINE, CATHETERIZED  Final   Special Requests NONE  Final   Culture >=100,000 COLONIES/mL ESCHERICHIA COLI  Final   Report Status 01/12/2015 FINAL  Final   Organism ID, Bacteria ESCHERICHIA COLI  Final      Susceptibility   Escherichia coli - MIC*    AMPICILLIN 8 SENSITIVE Sensitive     CEFAZOLIN <=4 SENSITIVE Sensitive     CEFTRIAXONE <=1 SENSITIVE Sensitive     CIPROFLOXACIN <=0.25 SENSITIVE Sensitive     GENTAMICIN <=1 SENSITIVE Sensitive     IMIPENEM <=0.25 SENSITIVE Sensitive     NITROFURANTOIN <=16 SENSITIVE Sensitive     TRIMETH/SULFA <=20 SENSITIVE Sensitive     AMPICILLIN/SULBACTAM 4 SENSITIVE Sensitive     PIP/TAZO <=4 SENSITIVE Sensitive      * >=100,000 COLONIES/mL ESCHERICHIA COLI  Clostridium Difficile by PCR (not at [facility])     Status: None   Collection Time: 01/11/15 11:08 AM  Result Value Ref Range Status   C difficile by pcr NEGATIVE NEGATIVE Final    Assessment: 64yo female continuing on ceftriaxone. Initially started on Vanc and Zosyn for presumed sepsis. Source is L pyelonephritis and blood and urine cultures are positive for pan-sensitive Ecoli. Antibiotics now de-escalated to ceftriaxone on 7/3. Afebrile, wbc up to 55.3. LA down to 2.9, PCT trending down. Placed nephrostomy tube on 7/1. Ceftriaxone is not renally adjusted. Pharmacy will sign off consult.  Vanc x 1 6/30  Zosyn x 1 6/30  Cipro 7/1>>7/2  Ceftaz 7/1>>7/2  Imipenem 7/2>>7/3  Ceftriaxone 7/3>>   7/1 urine >>Ecoli pan sens  6/30 Blood>>Ecoli pan sens   Goal of Therapy:  Resolution of infection  Plan:  Ceftriaxone 2g IV daily  Mon clinical progress, wbc trend  Ceftriaxone is not renally adjusted. Pharmacy will sign off consult.  Elicia Lamp, PharmD Clinical Pharmacist - Resident Pager [phone] 01/14/2015 7:28 AM

## 2015-01-14 NOTE — Progress Notes (Signed)
[place] Progress Note Patient Name: Becky Rogers DOB: 22-Jun-1951 MRN: [id number]   Date of Service  01/14/2015  HPI/Events of Note  Patient on SSI q4 hours.  Is taking pos now.  Has not required any insulin.  Is not diabetic.  eICU Interventions  Plan: D/C SSI Monitor glucose via BMETs     Intervention Category Intermediate Interventions: Other:  DETERDING,ELIZABETH 01/14/2015, 3:44 AM

## 2015-01-14 NOTE — Progress Notes (Signed)
Elink and Dr Deterding notified of WBC, renal labs and pltc counts.

## 2015-01-14 NOTE — Progress Notes (Signed)
Rehab Admissions Coordinator Note:  Patient was screened by Retta Diones for appropriateness for an Inpatient Acute Rehab Consult.  At this time, we are recommending Inpatient Rehab consult.  Jodell Cipro M 01/14/2015, 9:28 AM  I can be reached at [phone].

## 2015-01-14 NOTE — Progress Notes (Signed)
Subjective:  1- Left Ureteral Stone - s/p left neph tube 01/10/15 for urgent decompression from 7.87mm left prox ureteral stone with mild-mod hydro and ipsilateral approx 17mm lower pole stone by ER CT 01/09/15 on eval left abdominal pain and septic shock. No prior nephrolithiasis. No contralateral stones.  2 - Urosepsis - fevers, tachycardia, hypotension on ER presentation c/w sepsis. UCX, BCX pending with pan-sensitive e. Coli, now on high dose rocephin.   3 - Acute Renal Failure - Cr 2.9 on ER presentation up from baseline <1. Admits to some poor PO intake and now recent hypotention, left hydro. No right hydro or obvious renal atrophy.  Cr peak high 3's this admission. R/u [place] after neph tube placement with resolved left hydro and confirms no right sided. UOP has been approx symmetric per urethra v. neph tube.   Today Becky Rogers is feeling stronger. Some mental status changes over weekend but negative head CT. Now off pressors and neck line out. No high fevers x 24 hrs.   Objective: Vital signs in last 24 hours: Temp:  [97.2 F (36.2 C)-98.9 F (37.2 C)] 98.9 F (37.2 C) (07/05 1159) Pulse Rate:  [58-86] 66 (07/05 1740) Resp:  [14-25] 20 (07/05 1740) BP: (97-113)/(51-63) 113/60 mmHg (07/05 1740) SpO2:  [93 %-100 %] 97 % (07/05 1740) Weight:  [58.8 kg (129 lb 10.1 oz)] 58.8 kg (129 lb 10.1 oz) (07/05 0500) Last BM Date: 01/12/15  Intake/Output from previous day: 07/04 0701 - 07/05 0700 In: 645 [P.O.:460; IV Piggyback:50] Out: 2005 [Urine:2005] Intake/Output this shift: Total I/O In: 500 [P.O.:500] Out: 570 [Urine:570]  General appearance: alert, cooperative, appears stated age and husband at bedside Head: Normocephalic, without obvious abnormality, atraumatic Nose: Nares normal. Septum midline. Mucosa normal. No drainage or sinus tenderness. Throat: lips, mucosa, and tongue normal; teeth and gums normal Neck: no adenopathy, no carotid bruit, no JVD, supple, symmetrical, trachea  midline and thyroid not enlarged, symmetric, no tenderness/mass/nodules Back: symmetric, no curvature. ROM normal. No CVA tenderness., left neph tube with dark yellow urine. No clots.  Resp: non-labored on [place] O2 Cardio: Nl rate GI: soft, non-tender; bowel sounds normal; no masses,  no organomegaly Pelvic: external genitalia normal and foley c/d/i with clear yellow urine.  Skin: Skin color, texture, turgor normal. No rashes or lesions Neurologic: Grossly normal  EXT: dark erythema all fingers / toes. Digits all warm and with prompt cap refill.   Lab Results:   Recent Labs  01/13/15 0400 01/14/15 0245  WBC 42.8* 55.3*  HGB 13.8 13.2  HCT 38.8 37.8  PLT 24* 29*   BMET  Recent Labs  01/13/15 0400 01/14/15 0245  NA 140 140  K 3.8 3.7  CL 103 104  CO2 23 24  GLUCOSE 85 112*  BUN 69* 91*  CREATININE 3.76* 3.43*  CALCIUM 8.0* 7.9*   PT/INR  Recent Labs  01/12/15 1146  LABPROT 16.9*  INR 1.36   ABG  Recent Labs  01/11/15 2313 01/12/15 1225  PHART 7.409 7.448  HCO3 18.9* 19.0*    Studies/Results: Dg Chest Port 1 View  01/13/2015   CLINICAL DATA:  Pulmonary infiltrate  EXAM: PORTABLE CHEST - 1 VIEW  COMPARISON:  01/11/2015  FINDINGS: Stable positioning of left IJ central line, tip at the upper cavoatrial junction.  Stable borderline cardiomegaly.  Negative aortic and hilar contours.  Increased hazy density over the lower chest compatible with pleural fluid. The change could reflect increase or differential layering. Diffuse interstitial opacities compatible with edema and confirmed  on abdominal CT 01/09/2015.  IMPRESSION: Pulmonary edema with bilateral pleural effusion. Left pleural fluid could have increased or layered.   Electronically Signed   By: Monte Fantasia M.D.   On: 01/13/2015 06:12    Anti-infectives: Anti-infectives    Start     Dose/Rate Route Frequency Ordered Stop   01/12/15 2200  cefTRIAXone (ROCEPHIN) 2 g in dextrose 5 % 50 mL IVPB - Premix     2  g 100 mL/hr over 30 Minutes Intravenous Every 24 hours 01/12/15 1144     01/11/15 1400  imipenem-cilastatin (PRIMAXIN) 250 mg in sodium chloride 0.9 % 100 mL IVPB  Status:  Discontinued     250 mg 200 mL/hr over 30 Minutes Intravenous Every 12 hours 01/11/15 1333 01/12/15 1129   01/10/15 2130  vancomycin (VANCOCIN) 500 mg in sodium chloride 0.9 % 100 mL IVPB  Status:  Discontinued     500 mg 100 mL/hr over 60 Minutes Intravenous Every 24 hours 01/09/15 2129 01/09/15 2344   01/10/15 1430  cefTAZidime (FORTAZ) 2 g in dextrose 5 % 50 mL IVPB  Status:  Discontinued     2 g 100 mL/hr over 30 Minutes Intravenous Every 24 hours 01/10/15 1417 01/11/15 1321   01/10/15 0330  piperacillin-tazobactam (ZOSYN) IVPB 3.375 g  Status:  Discontinued     3.375 g 12.5 mL/hr over 240 Minutes Intravenous Every 8 hours 01/09/15 2134 01/09/15 2344   01/10/15 0030  ciprofloxacin (CIPRO) IVPB 400 mg  Status:  Discontinued     400 mg 200 mL/hr over 60 Minutes Intravenous Daily at bedtime 01/10/15 0005 01/11/15 1321   01/09/15 2130  piperacillin-tazobactam (ZOSYN) IVPB 3.375 g     3.375 g 100 mL/hr over 30 Minutes Intravenous  Once 01/09/15 2126 01/09/15 2202   01/09/15 2130  vancomycin (VANCOCIN) IVPB 1000 mg/200 mL premix  Status:  Discontinued     1,000 mg 200 mL/hr over 60 Minutes Intravenous  Once 01/09/15 2126 01/09/15 2129   01/09/15 2130  vancomycin (VANCOCIN) IVPB 750 mg/150 ml premix     750 mg 150 mL/hr over 60 Minutes Intravenous  Once 01/09/15 2129 01/09/15 2306      Assessment/Plan:  1- Left Ureteral Stone - now s/p decompression with neph tube, appreciate IR service as avoided likely prolonged intubation. Will need eventual ureteroscopy in elective setting, certainly not this admission.   2 - Urosepsis - agree with current and nephron-friendly ABX regimen now according to CX data.   3 - Acute Renal Failure - likely multifactorial with left renal obstruction but also recent profound  hypoperfusion state, now improving some.  4 - will follow.   [facility], Carle Dargan 01/14/2015

## 2015-01-14 NOTE — Progress Notes (Signed)
CRITICAL VALUE ALERT  Critical value received:  Lactic Acid 2.6  Date of notification:  01/14/15  Time of notification:  7473  Critical value read back:Yes.    Nurse who received alert:  Felicity Coyer, RN  MD notified (1st page):  Dr. Stevenson Clinch  Time of first page:  1547  MD notified (2nd page):  Time of second page:  Responding MD:  Dr. Stevenson Clinch  Time MD responded:  [phone]

## 2015-01-14 NOTE — Progress Notes (Signed)
Referring Physician(s): CCM  Subjective:  L PCN placed 7/1 Hydro/pyonephrosis Up in [place] - better daily Still with some confusion Output great  Allergies: Review of patient's allergies indicates no known allergies.  Medications: Prior to Admission medications   Medication Sig Start Date End Date Taking? Authorizing Provider  amLODipine (NORVASC) 5 MG tablet Take 5 mg by mouth daily.   Yes Historical Provider, MD  aspirin 81 MG tablet Take 81 mg by mouth daily.   Yes Historical Provider, MD  atorvastatin (LIPITOR) 40 MG tablet Take 40 mg by mouth daily.   Yes Historical Provider, MD  CALCIUM-VITAMIN D PO Take 2 tablets by mouth daily.   Yes Historical Provider, MD  carbidopa-levodopa (SINEMET IR) 25-100 MG per tablet Take 2 tablets by mouth 3 (three) times daily.   Yes Historical Provider, MD  clonazePAM (KLONOPIN) 0.5 MG tablet Take 0.5 mg by mouth at bedtime.   Yes Historical Provider, MD  fish oil-omega-3 fatty acids 1000 MG capsule Take 3 g by mouth daily.   Yes Historical Provider, MD  Multiple Vitamins-Minerals (MULTIVITAMINS THER. W/MINERALS) TABS Take 1 tablet by mouth daily.   Yes Historical Provider, MD  TRIHEXYPHENIDYL HCL PO Take 1 tablet by mouth 2 (two) times daily.   Yes Historical Provider, MD  HYDROcodone-acetaminophen (NORCO) 5-325 MG per tablet Take 1-2 tablets by mouth every 6 (six) hours as needed for moderate pain or severe pain. Patient not taking: Reported on 01/09/2015 08/28/14   [name], MD     Vital Signs: BP 109/56 mmHg  Pulse 77  Temp(Src) 98.9 F (37.2 C) (Oral)  Resp 18  Ht 5\' 2"  (1.575 m)  Wt 129 lb 10.1 oz (58.8 kg)  BMI 23.70 kg/m2  SpO2 93%  Physical Exam  Skin: Skin is warm and dry.  Site of L PCN clean and dry Tubing intact Output yellow 600 cc yesterday 200 cc in bag Bun/Cr stable at 91/3.43 afeb  Wbc high--on Solu cortef  Nursing note and vitals reviewed.   Imaging: Ct Head Wo Contrast  01/12/2015   CLINICAL  DATA:  Altered mental status, sepsis.  EXAM: CT HEAD WITHOUT CONTRAST  TECHNIQUE: Contiguous axial images were obtained from the base of the skull through the vertex without intravenous contrast.  COMPARISON:  None.  FINDINGS: No acute intracranial abnormality. Specifically, no hemorrhage, hydrocephalus, mass lesion, acute infarction, or significant intracranial injury. No acute calvarial abnormality. Visualized paranasal sinuses and mastoids clear. Orbital soft tissues unremarkable.  IMPRESSION: No acute intracranial abnormality.   Electronically Signed   By: Rolm Baptise M.D.   On: 01/12/2015 13:09   US Abdomen Port  01/11/2015   CLINICAL DATA:  Evaluate gallbladder.  EXAM: ULTRASOUND PORTABLE ABDOMEN  COMPARISON:  CT of 01/09/2015  FINDINGS: Gallbladder: Gallbladder distension at 8.4 cm. Sludge within. Wall thickness upper normal. No pericholecystic fluid. Sonographic Murphy's sign was not elicited.  Common bile duct: Diameter: Upper normal for age, 7 mm. No obstruction on prior CT.  Liver: Mild intrahepatic ductal dilatation within the left lobe. No focal liver lesion.  IVC: No abnormality visualized.  Pancreas: Visualized portion unremarkable.  Spleen: Size and appearance within normal limits.  Right Kidney: Length: 11.3 cm. No hydronephrosis. Mild increased echogenicity.  Left Kidney: Length: 11.7 cm. Poorly evaluated. No gross hydronephrosis.  Abdominal aorta: No aneurysm visualized.  Other findings: Bilateral pleural effusions.  IMPRESSION: 1. Gallbladder sludge and gallbladder distention, without specific evidence of acute cholecystitis. 2. Upper normal common duct size with mild intrahepatic  duct dilatation in the left hepatic lobe. No obstructive mass or stone seen on recent CT. If bilirubin is elevated, MRCP may be informative to exclude otherwise occult obstruction. 3. Small bilateral pleural effusions. 4. Suboptimal evaluation of the left kidney. 5. Increased echogenicity within the right kidney may  represent medical renal disease.   Electronically Signed   By: Abigail Miyamoto M.D.   On: 01/11/2015 19:34   Dg Chest Port 1 View  01/13/2015   CLINICAL DATA:  Pulmonary infiltrate  EXAM: PORTABLE CHEST - 1 VIEW  COMPARISON:  01/11/2015  FINDINGS: Stable positioning of left IJ central line, tip at the upper cavoatrial junction.  Stable borderline cardiomegaly.  Negative aortic and hilar contours.  Increased hazy density over the lower chest compatible with pleural fluid. The change could reflect increase or differential layering. Diffuse interstitial opacities compatible with edema and confirmed on abdominal CT 01/09/2015.  IMPRESSION: Pulmonary edema with bilateral pleural effusion. Left pleural fluid could have increased or layered.   Electronically Signed   By: Monte Fantasia M.D.   On: 01/13/2015 06:12   Dg Chest Portable 1 View  01/11/2015   CLINICAL DATA:  Hypoxia.  Septic shock.  Pyelonephritis.  EXAM: PORTABLE CHEST - 1 VIEW  COMPARISON:  Chest x-rays dated 01/10/2015, 01/09/2015 and 05/08/2007  FINDINGS: Central line appears in good position at the cavoatrial junction. Increasing bilateral pulmonary infiltrates with new bilateral pleural effusions. The findings consistent with pulmonary edema. The possibility of ARDS should be considered. Heart size is normal. Slight pulmonary vascular prominence.  IMPRESSION: Progressive bilateral pulmonary infiltrates and pleural effusions. Findings are consistent with pulmonary edema.   Electronically Signed   By: Lorriane Shire M.D.   On: 01/11/2015 13:32    Labs:  CBC:  Recent Labs  01/11/15 0648 01/12/15 0353 01/13/15 0400 01/14/15 0245  WBC 21.4* 26.7* 42.8* 55.3*  HGB 13.6 12.8 13.8 13.2  HCT 37.3 36.3 38.8 37.8  PLT 34* 24* 24* 29*    COAGS:  Recent Labs  01/10/15 0050 01/12/15 1146  INR 2.22* 1.36    BMP:  Recent Labs  01/11/15 1400 01/12/15 0353 01/13/15 0400 01/14/15 0245  NA 135 137 140 140  K 4.5 4.6 3.8 3.7  CL 110 107  103 104  CO2 17* 20* 23 24  GLUCOSE 102* 121* 85 112*  BUN 45* 47* 69* 91*  CALCIUM 7.5* 7.8* 8.0* 7.9*  CREATININE 3.23* 3.21* 3.76* 3.43*  GFRNONAA 14* 14* 12* 13*  GFRAA 16* 16* 14* 15*    LIVER FUNCTION TESTS:  Recent Labs  01/09/15 2100 01/10/15 0050 01/11/15 0648 01/13/15 0400  BILITOT 1.3* 1.1 1.5* 1.2  AST 42* 49* 54* 38  ALT 20 23 13* 8*  ALKPHOS 270* 154* 106 231*  PROT 5.4* 4.4* 4.1* 4.8*  ALBUMIN 3.2* 2.5* 1.9* 2.0*    Assessment and Plan:  L PCN intact Plan per Uro/CCM We will follow PCN   Signed: Vinicius Brockman A 01/14/2015, 1:42 PM   I spent a total of 15 Minutes in face to face in clinical consultation/evaluation, greater than 50% of which was counseling/coordinating care for L PCN

## 2015-01-14 NOTE — Progress Notes (Signed)
Physical Therapy Treatment Patient Details Name: Becky Rogers MRN: [id number] DOB: 21-Mar-1951 Today's Date: 01/14/2015    History of Present Illness 64 year old female with parkinsons dz presented 6/30 with abdominal pain. In ED she was profoundly hypotensive which was not responsive to IVF resuscitation. Lactic acid elevation, started on pressors. AMS noted. Pt with sepsis, pleural effusion, nephrostomy tube and decreaed bil LE circulation secondary to pressors    PT Comments    Pt with greatly improved mobility today but remains confused, requiring sequential cues for all mobility and safety. Pt perseverating on tasks and commands at times. Spouse present end of session and excited for progression and confirming prior independent status. Will continue to follow.   Follow Up Recommendations  CIR;Supervision/Assistance - 24 hour     Equipment Recommendations       Recommendations for Other Services       Precautions / Restrictions Precautions Precautions: Fall    Mobility  Bed Mobility Overal bed mobility: Needs Assistance Bed Mobility: Supine to Sit     Supine to sit: Supervision     General bed mobility comments: cues for sequence with use of rail and increased time  Transfers Overall transfer level: Needs assistance   Transfers: Sit to/from Stand Sit to Stand: Min assist         General transfer comment: cues for hand placement, safety and positioning. Sequential steps for scooting and anterior translation  Ambulation/Gait Ambulation/Gait assistance: Min assist Ambulation Distance (Feet): 170 Feet Assistive device: Rolling walker (2 wheeled) Gait Pattern/deviations: Step-through pattern;Decreased stride length   Gait velocity interpretation: Below normal speed for age/gender General Gait Details: cues for RW use, position in RW, directional cues and safety with assist to avoid obstacles   Stairs            Wheelchair Mobility    Modified Rankin  (Stroke Patients Only)       Balance Overall balance assessment: Needs assistance   Sitting balance-Leahy Scale: Fair       Standing balance-Leahy Scale: Poor                      Cognition Arousal/Alertness: Awake/alert Behavior During Therapy: Flat affect Overall Cognitive Status: Impaired/Different from baseline Area of Impairment: Orientation;Attention;Memory;Following commands;Safety/judgement;Awareness;Problem solving Orientation Level: Disoriented to;Time;Situation Current Attention Level: Sustained Memory: Decreased short-term memory Following Commands: Follows one step commands inconsistently;Follows one step commands with increased time Safety/Judgement: Decreased awareness of safety;Decreased awareness of deficits   Problem Solving: Slow processing;Decreased initiation;Difficulty sequencing;Requires verbal cues;Requires tactile cues General Comments: pt requires frequent and consistent cues for all mobility, pt impulsive and unable to problem solve how to turn and direct RW to return to room. Providing correct information about spouse who was present end of session    Exercises General Exercises - Lower Extremity Long Arc Quad: AROM;Seated;Both;15 reps Hip ABduction/ADduction: AROM;Seated;Both;15 reps Hip Flexion/Marching: AROM;Seated;Both;15 reps    General Comments        Pertinent Vitals/Pain Pain Assessment: No/denies pain    Home Living                      Prior Function            PT Goals (current goals can now be found in the care plan section) Progress towards PT goals: Progressing toward goals    Frequency       PT Plan Current plan remains appropriate    Co-evaluation  End of Session Equipment Utilized During Treatment: Gait belt Activity Tolerance: Patient tolerated treatment well Patient left: in chair;with call bell/phone within reach;with family/visitor present;with chair alarm set     Time:  3212-2482 PT Time Calculation (min) (ACUTE ONLY): 25 min  Charges:  $Gait Training: 8-22 mins $Therapeutic Exercise: 8-22 mins                    G Codes:      Melford Aase 01-17-15, 1:02 PM Elwyn Reach, [place]

## 2015-01-14 NOTE — Progress Notes (Signed)
PULMONARY / CRITICAL CARE MEDICINE   Name: Becky Rogers MRN: [id number] DOB: 28-Jan-1951    ADMISSION DATE:  01/09/2015 CONSULTATION DATE:  01/09/2015  REFERRING MD :  EDP  CHIEF COMPLAINT:  Abd pain  INITIAL PRESENTATION: 64 year old female with parkinsons dz presented 6/30 with abdominal pain. In ED she was profoundly hypotensive which was not responsive to IVF resuscitation. Lactic acid elevation, started on pressors. PCCM to admit.   STUDIES:  6/30 CT abd > 59mm L UPJ stone with mild dhydronephrosis, Gallbladder sludge vs small stones, peripancreatic fluid. Bilateral lower lobe pulmonary opacities.  7/3 CT head> no intracranial abnormalities 7/4 CXR > Pulmonary Edema with Bilateral Pleural Effusion  SIGNIFICANT EVENTS: 6/30 admitted for presumed sepsis 6/30 left perc nephrostomy tube placed. GNR growing in urine.  7/3 weaned from Pressors 7/4 SLP eval > dysphagia 2 with thin liquid.  Changed to dysphagia 3 7/5.  Subjective/Overnight:  No acute events overnight.  Denies chest pain, SOB.  Asking to get out of bed.  VITAL SIGNS: Temp:  [97.2 F (36.2 C)-98.9 F (37.2 C)] 98.6 F (37 C) (07/05 1010) Pulse Rate:  [64-86] 67 (07/05 1010) Resp:  [14-25] 18 (07/05 1010) BP: (99-117)/(52-63) 109/56 mmHg (07/05 1010) SpO2:  [97 %-100 %] 100 % (07/05 1010) Weight:  [58.8 kg (129 lb 10.1 oz)-59.4 kg (130 lb 15.3 oz)] 58.8 kg (129 lb 10.1 oz) (07/05 0500) HEMODYNAMICS:   VENTILATOR SETTINGS:   INTAKE / OUTPUT:  Intake/Output Summary (Last 24 hours) at 01/14/15 1044 Last data filed at 01/14/15 0843  Gross per 24 hour  Intake    615 ml  Output   1775 ml  Net  -1160 ml    PHYSICAL EXAMINATION: General:  Female of normal body habitus in NAD. Neuro:  A&O x 3, MAE's. HEENT:  [place]/AT, no JVD noted, PERRL.  Cardiovascular:  RRR, no MRG. Lungs:  CTAB, normal WOB. Abdomen:  Soft, diffusely tender, non-distended, L nephrostomy with small amt urine. Musculoskeletal:  No acute  deformity or ROM limitaiton. Skin:  Grossly intact.  LABS:  CBC  Recent Labs Lab 01/12/15 0353 01/13/15 0400 01/14/15 0245  WBC 26.7* 42.8* 55.3*  HGB 12.8 13.8 13.2  HCT 36.3 38.8 37.8  PLT 24* 24* 29*   Coag's  Recent Labs Lab 01/10/15 0050 01/12/15 1146  INR 2.22* 1.36   BMET  Recent Labs Lab 01/12/15 0353 01/13/15 0400 01/14/15 0245  NA 137 140 140  K 4.6 3.8 3.7  CL 107 103 104  CO2 20* 23 24  BUN 47* 69* 91*  CREATININE 3.21* 3.76* 3.43*  GLUCOSE 121* 85 112*   Electrolytes  Recent Labs Lab 01/10/15 0050  01/11/15 0648  01/12/15 0353 01/13/15 0400 01/14/15 0245  CALCIUM 6.9*  < > 7.0*  < > 7.8* 8.0* 7.9*  MG 1.4*  --  1.8  --   --   --   --   PHOS 2.9  --   --   --   --   --   --   < > = values in this interval not displayed. Sepsis Markers  Recent Labs Lab 01/10/15 0738 01/11/15 0648 01/11/15 1310 01/12/15 0353 01/12/15 1108 01/13/15 0400  LATICACIDVEN 4.1*  --  4.1*  --  2.9*  --   PROCALCITON  --  97.46  --  50.73  --  34.15   ABG  Recent Labs Lab 01/11/15 1356 01/11/15 2313 01/12/15 1225  PHART 7.272* 7.409 7.448  PCO2ART 30.1* 29.8*  27.5*  PO2ART 63.0* 72.0* 52.0*   Liver Enzymes  Recent Labs Lab 01/10/15 0050 01/11/15 0648 01/13/15 0400  AST 49* 54* 38  ALT 23 13* 8*  ALKPHOS 154* 106 231*  BILITOT 1.1 1.5* 1.2  ALBUMIN 2.5* 1.9* 2.0*   Cardiac Enzymes No results for input(s): TROPONINI, PROBNP in the last 168 hours. Glucose  Recent Labs Lab 01/13/15 0750 01/13/15 1153 01/13/15 1653 01/13/15 1959 01/14/15 0021 01/14/15 0754  GLUCAP 78 100* 92 112* 99 101*    Imaging No results found.  ASSESSMENT / PLAN:  PULMONARY A: Acute hypoxic respiratory failure - resolved Bibasilar opacifications on CT concerning for early ALI Hypoxia - resolved  Pulmonary edema with bilateral pleural effusion ALI ? P:   Supplemental O2 as needed. Incentive spirometry. Hold lasix for now given AKI. Mobilize as  bale. Pulmonary hygiene.   CARDIOVASCULAR CVL 6/30 >>> 7/4. A:  Septic Shock, likely septic in setting pyelo - resolved P:  MAP goal > 31mm/Hg. Cont stress steroids. Monitor hemodynamics.  RENAL A:   L UPJ stone and hydronephrosis, s/p Left Perc nephrostomy tube 7/1 AKI/ ATN  AGMA/lactic acidosis - resolved Hypokalemia - Resolved Hypomagnesemia - Resolved At risk post obstructive diuresis P:   Renal dose meds. Dc lasix. Follow Bmet. Urology following.  GASTROINTESTINAL A:   Possible Cholelithiasis Upper abdominal, peripancreatic and periportal fluid on CT >LFTs trending down  Poor PO intake  P:   Dysphagia 3 diet per SLP recs.  IV Protonix. F/u LFTs intermittently.  HEMATOLOGIC A:   Thrombocytopenia  Coagulopathy - resolved P:  Follow CBC. SCD's.  INFECTIOUS A:   Septic shock secondary to L pyelonephritis  P:   BCx2 6/30: GNR>>>EColi (pan sens)  UC 6/30 >>GNR>>>EColi (pan sens)  CDiff 7/2>>>NEG Ciprofloxacin 6/30 >>>7/2 ceftaz 7/1>>>7/2 Imipenem 7/2>>>7/3 CTX 7/3 >>> (through 7/12 for total 10 days)  PCT algorithm - concerning but trending down   ENDOCRINE A:     No acute issues P:   Monitor glucose on Chem.  NEUROLOGIC A:   Acute metabolic encephalopathy - slowly resolving. At baseline she was ambulatory, driving, only mild dementia Hx parkinson's, dementia  Generalized deconditioning. P:   RASS goal: 0. Home medications restarted: Sinemet, Artane. PT consult.    FAMILY  - Updates:  Husband updated at length at bedside 7/2 - Inter-disciplinary family meet or Palliative Care meeting due by:  7/7  Transfer to [facility] service under Dr. Sherral Hammers starting AM 7/6 (discussed with him).  PCCM will follow up until then, please call back if we can be of further assistance.   Montey Hora, [place] [facility] Pager: [phone]  or [phone] 01/14/2015, 10:55 AM

## 2015-01-14 NOTE — Consult Note (Signed)
Physical Medicine and Rehabilitation Consult  Reason for Consult: Encephalopathy due to E coli sepsis in Parkinson's patient Referring Physician:  Dr. Sherral Hammers.    HPI: Becky Rogers is a 64 y.o. female with history of Parkinson's disease with mild dementia, breast cancer, HTN who was admitted 01/09/15 with abdominal pain, acute renal failure and hypotension due to urosepsis.  She was started on pressors, solucortef and fluids as well as broad spectrum antibiotics for septic shock. CT revealed left ureteral stone with concerns for obstructed pyonephrosis and underwent left percutaneous nephrostomy by interventional radiology. Dr. Alinda Money recommended 14 days of culture specific antibiotics prior to any definitive stone procedure and follow up with Dr. Tresa Moore on outpatient basis.  Antibiotics to ceftriaxone for pansensitive E coli bacteremia.  Mentation improving but WBC on upward trend. Swallow evaluation revealed mild cognitively based dysphagia and esophageal/aspiration precautions recommended.  Mobility limited by foot pain due to ischemic toes.  CIR recommended by MD and Rehab team.    Review of Systems  Unable to perform ROS: mental acuity  Eyes: Negative for blurred vision.  Respiratory: Negative for shortness of breath.   Cardiovascular: Negative for chest pain.  Gastrointestinal: Negative for abdominal pain.  Musculoskeletal: Positive for myalgias.  Neurological: Positive for dizziness. Negative for headaches.  Psychiatric/Behavioral: Positive for memory loss.      Past Medical History  Diagnosis Date  . Hypertension   . Hypercholesteremia   . Parkinson disease   . Breast cancer     right    Past Surgical History  Procedure Laterality Date  . Cesarean section      x2  . Ovarian cyst removal      age 43  . Appendectomy    . Colonoscopy    . Dental surgery      implant  . Breast lumpectomy with radioactive seed localization Right 08/28/2014    Procedure: RIGHT  BREAST LUMPECTOMY WITH RADIOACTIVE SEED LOCALIZATION;  Surgeon: [name], MD;  Location: [facility];  Service: General;  Laterality: Right;    History reviewed. No pertinent family history.    Social History:  Married. Independent PTA. 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 Reports that husband does most of home management and meal prep? she reports that she has never smoked. She does not have any smokeless tobacco history on file. Per reports that she drinks alcohol. She reports that she does not use illicit drugs.    Allergies: No Known Allergies    Medications Prior to Admission  Medication Sig Dispense Refill  . amLODipine (NORVASC) 5 MG tablet Take 5 mg by mouth daily.    Marland Kitchen aspirin 81 MG tablet Take 81 mg by mouth daily.    Marland Kitchen atorvastatin (LIPITOR) 40 MG tablet Take 40 mg by mouth daily.    Marland Kitchen CALCIUM-VITAMIN D PO Take 2 tablets by mouth daily.    . carbidopa-levodopa (SINEMET IR) 25-100 MG per tablet Take 2 tablets by mouth 3 (three) times daily.    . clonazePAM (KLONOPIN) 0.5 MG tablet Take 0.5 mg by mouth at bedtime.    . fish oil-omega-3 fatty acids 1000 MG capsule Take 3 g by mouth daily.    . Multiple Vitamins-Minerals (MULTIVITAMINS THER. W/MINERALS) TABS Take 1 tablet by mouth daily.    . TRIHEXYPHENIDYL HCL PO Take 1 tablet by mouth 2 (two) times daily.    Marland Kitchen HYDROcodone-acetaminophen (NORCO) 5-325 MG per tablet Take 1-2 tablets  by mouth every 6 (six) hours as needed for moderate pain or severe pain. (Patient not taking: Reported on 01/09/2015) 30 tablet 0    Home: Home Living Family/patient expects to be discharged to:: Private residence Living Arrangements: Spouse/significant other Additional Comments: patient with AMS unable to provide history, per review of chart and staff communication, patient was independent prior to admission  Functional History: Prior Function Level of Independence:  Independent Functional Status:  Mobility: Bed Mobility Overal bed mobility: Needs Assistance, +2 for physical assistance Bed Mobility: Supine to Sit Supine to sit: Mod assist, +2 for physical assistance General bed mobility comments: unable to problem solve or sequence through tasks to come to EOB, assist provided Transfers Overall transfer level: Needs assistance Equipment used: 2 person hand held assist Transfers: Sit to/from Stand, Stand Pivot Transfers Sit to Stand: Mod assist, +2 physical assistance Stand pivot transfers: Mod assist, +2 physical assistance General transfer comment: assist to power up, patient with some shuffling steps to chair but continued to require cues and assist      ADL:    Cognition: Cognition Overall Cognitive Status: Impaired/Different from baseline Orientation Level: Oriented to person, Disoriented to time, Disoriented to situation Cognition Arousal/Alertness: Awake/alert Behavior During Therapy: Flat affect Overall Cognitive Status: Impaired/Different from baseline Area of Impairment: Orientation, Attention, Memory, Following commands, Safety/judgement, Awareness, Problem solving Orientation Level: Disoriented to, Place, Time, Situation Current Attention Level: Focused Memory: Decreased short-term memory Following Commands: Follows one step commands inconsistently, Follows one step commands with increased time Safety/Judgement: Decreased awareness of safety, Decreased awareness of deficits Awareness: Intellectual Problem Solving: Slow processing, Decreased initiation, Difficulty sequencing, Requires verbal cues, Requires tactile cues General Comments: Patient able to initiate some commands but unable to carry out, patient with word finding difficulties and question some aspects of confabulation (pt reports she is here because she broke her ankle)   Blood pressure 109/56, pulse 67, temperature 98.6 F (37 C), temperature source Oral, resp. rate  18, height 5\' 2"  (1.575 m), weight 58.8 kg (129 lb 10.1 oz), SpO2 100 %. Physical Exam  Nursing note and vitals reviewed. Constitutional: She appears well-developed and well-nourished.  HENT:  Head: Normocephalic and atraumatic.  Eyes: Conjunctivae are normal. Pupils are equal, round, and reactive to light.  Neck: Normal range of motion. Neck supple.  Cardiovascular: Normal rate and regular rhythm.   Respiratory: Effort normal and breath sounds normal. No respiratory distress. She has no wheezes.  GI: Soft. Bowel sounds are normal. She exhibits no distension. There is no tenderness.  Musculoskeletal: She exhibits edema (1+ edema LUE).  Neurological: She is alert.  Masked facies, no rigidity or obvious tremor. slow to initiate verbally and motorically though.Oriented to self and place "hospital". Situation with cues. Date "Jan" but told me yesterday was the 4th of July Distracted and had difficulty answering medical/social questions. Able to follow simple motor commands and had difficulty with two step commands.  UE's 3+ prox to 4/5 distally. LE's 2+hf, 3/5 ke and 3+ ankles. Senses pain and LT in both legs.   Skin: Skin is warm. She is diaphoretic.  Diaphoretic.  Digits on bilateral hands and toes with different stages of cyanosis. Toes mildly tender to touch.   Psychiatric: Her speech is delayed. She is slowed. She exhibits abnormal recent memory and abnormal remote memory.  Pleasant but confused She is inattentive.    Results for orders placed or performed during the hospital encounter of 01/09/15 (from the past 24 hour(s))  Glucose, capillary  Status: Abnormal   Collection Time: 01/13/15 11:53 AM  Result Value Ref Range   Glucose-Capillary 100 (H) 65 - 99 mg/dL  Glucose, capillary     Status: None   Collection Time: 01/13/15  4:53 PM  Result Value Ref Range   Glucose-Capillary 92 65 - 99 mg/dL  Glucose, capillary     Status: Abnormal   Collection Time: 01/13/15  7:59 PM  Result  Value Ref Range   Glucose-Capillary 112 (H) 65 - 99 mg/dL  Glucose, capillary     Status: None   Collection Time: 01/14/15 12:21 AM  Result Value Ref Range   Glucose-Capillary 99 65 - 99 mg/dL  CBC     Status: Abnormal   Collection Time: 01/14/15  2:45 AM  Result Value Ref Range   WBC 55.3 (HH) 4.0 - 10.5 K/uL   RBC 4.45 3.87 - 5.11 MIL/uL   Hemoglobin 13.2 12.0 - 15.0 g/dL   HCT 37.8 36.0 - 46.0 %   MCV 84.9 78.0 - 100.0 fL   MCH 29.7 26.0 - 34.0 pg   MCHC 34.9 30.0 - 36.0 g/dL   RDW 14.3 11.5 - 15.5 %   Platelets 29 (LL) 150 - 400 K/uL  Basic metabolic panel     Status: Abnormal   Collection Time: 01/14/15  2:45 AM  Result Value Ref Range   Sodium 140 135 - 145 mmol/L   Potassium 3.7 3.5 - 5.1 mmol/L   Chloride 104 101 - 111 mmol/L   CO2 24 22 - 32 mmol/L   Glucose, Bld 112 (H) 65 - 99 mg/dL   BUN 91 (H) 6 - 20 mg/dL   Creatinine, Ser 3.43 (H) 0.44 - 1.00 mg/dL   Calcium 7.9 (L) 8.9 - 10.3 mg/dL   GFR calc non Af Amer 13 (L) >60 mL/min   GFR calc Af Amer 15 (L) >60 mL/min   Anion gap 12 5 - 15  Glucose, capillary     Status: Abnormal   Collection Time: 01/14/15  7:54 AM  Result Value Ref Range   Glucose-Capillary 101 (H) 65 - 99 mg/dL   Ct Head Wo Contrast  01/12/2015   CLINICAL DATA:  Altered mental status, sepsis.  EXAM: CT HEAD WITHOUT CONTRAST  TECHNIQUE: Contiguous axial images were obtained from the base of the skull through the vertex without intravenous contrast.  COMPARISON:  None.  FINDINGS: No acute intracranial abnormality. Specifically, no hemorrhage, hydrocephalus, mass lesion, acute infarction, or significant intracranial injury. No acute calvarial abnormality. Visualized paranasal sinuses and mastoids clear. Orbital soft tissues unremarkable.  IMPRESSION: No acute intracranial abnormality.   Electronically Signed   By: Rolm Baptise M.D.   On: 01/12/2015 13:09   Dg Chest Port 1 View  01/13/2015   CLINICAL DATA:  Pulmonary infiltrate  EXAM: PORTABLE CHEST - 1  VIEW  COMPARISON:  01/11/2015  FINDINGS: Stable positioning of left IJ central line, tip at the upper cavoatrial junction.  Stable borderline cardiomegaly.  Negative aortic and hilar contours.  Increased hazy density over the lower chest compatible with pleural fluid. The change could reflect increase or differential layering. Diffuse interstitial opacities compatible with edema and confirmed on abdominal CT 01/09/2015.  IMPRESSION: Pulmonary edema with bilateral pleural effusion. Left pleural fluid could have increased or layered.   Electronically Signed   By: Monte Fantasia M.D.   On: 01/13/2015 06:12    Assessment/Plan: Diagnosis: debility and encephalopathy after sepsis, hx of parkinson's disease 1. Does the need for close, 24  hr/day medical supervision in concert with the patient's rehab needs make it unreasonable for this patient to be served in a less intensive setting? Yes 2. Co-Morbidities requiring supervision/potential complications: ams, pylonephritis 3. Due to bladder management, bowel management, safety, skin/wound care, disease management, medication administration, pain management and patient education, does the patient require 24 hr/day rehab nursing? Yes 4. Does the patient require coordinated care of a physician, rehab nurse, PT (1-2 hrs/day, 5 days/week), OT (1-2 hrs/day, 5 days/week) and SLP (1-2 hrs/day, 5 days/week) to address physical and functional deficits in the context of the above medical diagnosis(es)? Yes Addressing deficits in the following areas: balance, endurance, locomotion, strength, transferring, bowel/bladder control, bathing, dressing, feeding, grooming, toileting, cognition, speech, swallowing and psychosocial support 5. Can the patient actively participate in an intensive therapy program of at least 3 hrs of therapy per day at least 5 days per week? Yes 6. The potential for patient to make measurable gains while on inpatient rehab is excellent 7. Anticipated  functional outcomes upon discharge from inpatient rehab are supervision  with PT, supervision with OT, supervision with SLP. 8. Estimated rehab length of stay to reach the above functional goals is: 11-15 days 9. Does the patient have adequate social supports and living environment to accommodate these discharge functional goals? Yes 10. Anticipated D/C setting: Home 11. Anticipated post D/C treatments: HH therapy and Outpatient therapy 12. Overall Rehab/Functional Prognosis: excellent  RECOMMENDATIONS: This patient's condition is appropriate for continued rehabilitative care in the following setting: CIR Patient has agreed to participate in recommended program. Yes Note that insurance prior authorization may be required for reimbursement for recommended care.  Comment: Rehab Admissions Coordinator to follow up.  Thanks,  [name], MD, Mellody Drown     01/14/2015

## 2015-01-15 ENCOUNTER — Inpatient Hospital Stay (HOSPITAL_COMMUNITY): Payer: BLUE CROSS/BLUE SHIELD

## 2015-01-15 DIAGNOSIS — R1030 Lower abdominal pain, unspecified: Secondary | ICD-10-CM

## 2015-01-15 LAB — COMPREHENSIVE METABOLIC PANEL
ALT: 21 U/L (ref 14–54)
AST: 58 U/L — AB (ref 15–41)
Albumin: 2.4 g/dL — ABNORMAL LOW (ref 3.5–5.0)
Alkaline Phosphatase: 247 U/L — ABNORMAL HIGH (ref 38–126)
Anion gap: 12 (ref 5–15)
BUN: 86 mg/dL — AB (ref 6–20)
CHLORIDE: 107 mmol/L (ref 101–111)
CO2: 23 mmol/L (ref 22–32)
CREATININE: 2.38 mg/dL — AB (ref 0.44–1.00)
Calcium: 8 mg/dL — ABNORMAL LOW (ref 8.9–10.3)
GFR calc Af Amer: 24 mL/min — ABNORMAL LOW (ref >60)
GFR, EST NON AFRICAN AMERICAN: 20 mL/min — AB (ref >60)
Glucose, Bld: 123 mg/dL — ABNORMAL HIGH (ref 65–99)
Potassium: 3 mmol/L — ABNORMAL LOW (ref 3.5–5.1)
Sodium: 142 mmol/L (ref 135–145)
Total Bilirubin: 1 mg/dL (ref 0.3–1.2)
Total Protein: 5 g/dL — ABNORMAL LOW (ref 6.5–8.1)

## 2015-01-15 LAB — CBC
HCT: 35.5 % — ABNORMAL LOW (ref 36.0–46.0)
Hemoglobin: 12.3 g/dL (ref 12.0–15.0)
MCH: 29.6 pg (ref 26.0–34.0)
MCHC: 34.6 g/dL (ref 30.0–36.0)
MCV: 85.5 fL (ref 78.0–100.0)
Platelets: 53 10*3/uL — ABNORMAL LOW (ref 150–400)
RBC: 4.15 MIL/uL (ref 3.87–5.11)
RDW: 14.4 % (ref 11.5–15.5)
WBC: 55.6 10*3/uL — AB (ref 4.0–10.5)

## 2015-01-15 LAB — BASIC METABOLIC PANEL
ANION GAP: 11 (ref 5–15)
BUN: 94 mg/dL — ABNORMAL HIGH (ref 6–20)
CHLORIDE: 107 mmol/L (ref 101–111)
CO2: 25 mmol/L (ref 22–32)
Calcium: 7.8 mg/dL — ABNORMAL LOW (ref 8.9–10.3)
Creatinine, Ser: 2.8 mg/dL — ABNORMAL HIGH (ref 0.44–1.00)
GFR calc Af Amer: 19 mL/min — ABNORMAL LOW (ref >60)
GFR calc non Af Amer: 17 mL/min — ABNORMAL LOW (ref >60)
Glucose, Bld: 119 mg/dL — ABNORMAL HIGH (ref 65–99)
POTASSIUM: 2.8 mmol/L — AB (ref 3.5–5.1)
SODIUM: 143 mmol/L (ref 135–145)

## 2015-01-15 LAB — LACTIC ACID, PLASMA: LACTIC ACID, VENOUS: 2.4 mmol/L — AB (ref 0.5–2.0)

## 2015-01-15 LAB — MAGNESIUM: Magnesium: 2 mg/dL (ref 1.7–2.4)

## 2015-01-15 LAB — CALCIUM, IONIZED: Calcium, Ionized, Serum: 4.6 mg/dL (ref 4.5–5.6)

## 2015-01-15 LAB — PROCALCITONIN: Procalcitonin: 7.68 ng/mL

## 2015-01-15 LAB — PHOSPHORUS: Phosphorus: 3.7 mg/dL (ref 2.5–4.6)

## 2015-01-15 MED ORDER — CLONAZEPAM 0.5 MG PO TABS
0.5000 mg | ORAL_TABLET | Freq: Every day | ORAL | Status: DC
  Administered 2015-01-15: 0.5 mg via ORAL
  Filled 2015-01-15: qty 1

## 2015-01-15 MED ORDER — POTASSIUM CHLORIDE CRYS ER 20 MEQ PO TBCR
40.0000 meq | EXTENDED_RELEASE_TABLET | Freq: Two times a day (BID) | ORAL | Status: AC
  Administered 2015-01-15 – 2015-01-16 (×3): 40 meq via ORAL
  Filled 2015-01-15 (×4): qty 2

## 2015-01-15 MED ORDER — HYDROCORTISONE NA SUCCINATE PF 100 MG IJ SOLR
50.0000 mg | Freq: Two times a day (BID) | INTRAMUSCULAR | Status: DC
  Administered 2015-01-16 (×2): 50 mg via INTRAVENOUS
  Filled 2015-01-15 (×3): qty 1

## 2015-01-15 MED ORDER — SODIUM CHLORIDE 0.9 % IV SOLN: INTRAVENOUS | Status: DC

## 2015-01-15 NOTE — Progress Notes (Signed)
CRITICAL VALUE ALERT  Critical value received: platelets 24  Date of notification:  01/12/15  Time of notification:  0701  Critical value read back:Yes  Nurse who received alert: [facility] RN, change of shift handed off to Dustin Folks, Therapist, sports

## 2015-01-15 NOTE — Progress Notes (Signed)
Patient ID: Becky Rogers, female   DOB: 1951-02-18, 64 y.o.   MRN: [id number]     Referring Physician(s): CCM  Subjective:  Becky Rogers is doing ok today.  She has no complaints.  Her husband at the bedside.  He reports an xray was done for increase in WBC.  Allergies: Review of patient's allergies indicates no known allergies.  Medications: Prior to Admission medications   Medication Sig Start Date End Date Taking? Authorizing Provider  amLODipine (NORVASC) 5 MG tablet Take 5 mg by mouth daily.   Yes Historical Provider, MD  aspirin 81 MG tablet Take 81 mg by mouth daily.   Yes Historical Provider, MD  atorvastatin (LIPITOR) 40 MG tablet Take 40 mg by mouth daily.   Yes Historical Provider, MD  CALCIUM-VITAMIN D PO Take 2 tablets by mouth daily.   Yes Historical Provider, MD  carbidopa-levodopa (SINEMET IR) 25-100 MG per tablet Take 2 tablets by mouth 3 (three) times daily.   Yes Historical Provider, MD  clonazePAM (KLONOPIN) 0.5 MG tablet Take 0.5 mg by mouth at bedtime.   Yes Historical Provider, MD  fish oil-omega-3 fatty acids 1000 MG capsule Take 3 g by mouth daily.   Yes Historical Provider, MD  Multiple Vitamins-Minerals (MULTIVITAMINS THER. W/MINERALS) TABS Take 1 tablet by mouth daily.   Yes Historical Provider, MD  TRIHEXYPHENIDYL HCL PO Take 1 tablet by mouth 2 (two) times daily.   Yes Historical Provider, MD  HYDROcodone-acetaminophen (NORCO) 5-325 MG per tablet Take 1-2 tablets by mouth every 6 (six) hours as needed for moderate pain or severe pain. Patient not taking: Reported on 01/09/2015 08/28/14   [name], MD     Vital Signs: BP 111/55 mmHg  Pulse 55  Temp(Src) 97.9 F (36.6 C) (Oral)  Resp 15  Ht 5\' 2"  (1.575 m)  Wt 130 lb 1.1 oz (59 kg)  BMI 23.78 kg/m2  SpO2 97%  Physical Exam   Awake and Alert Answers only a couple of questions today.  Doesn't verbalize much, even with her husband. Left PCN intact.   Skin where tube enters looks ok. A little  ecchymosis.  No erythema Output 1770 mls clear yellow.  Imaging: Ct Head Wo Contrast  01/12/2015   CLINICAL DATA:  Altered mental status, sepsis.  EXAM: CT HEAD WITHOUT CONTRAST  TECHNIQUE: Contiguous axial images were obtained from the base of the skull through the vertex without intravenous contrast.  COMPARISON:  None.  FINDINGS: No acute intracranial abnormality. Specifically, no hemorrhage, hydrocephalus, mass lesion, acute infarction, or significant intracranial injury. No acute calvarial abnormality. Visualized paranasal sinuses and mastoids clear. Orbital soft tissues unremarkable.  IMPRESSION: No acute intracranial abnormality.   Electronically Signed   By: Rolm Baptise M.D.   On: 01/12/2015 13:09   US Abdomen Port  01/11/2015   CLINICAL DATA:  Evaluate gallbladder.  EXAM: ULTRASOUND PORTABLE ABDOMEN  COMPARISON:  CT of 01/09/2015  FINDINGS: Gallbladder: Gallbladder distension at 8.4 cm. Sludge within. Wall thickness upper normal. No pericholecystic fluid. Sonographic Murphy's sign was not elicited.  Common bile duct: Diameter: Upper normal for age, 7 mm. No obstruction on prior CT.  Liver: Mild intrahepatic ductal dilatation within the left lobe. No focal liver lesion.  IVC: No abnormality visualized.  Pancreas: Visualized portion unremarkable.  Spleen: Size and appearance within normal limits.  Right Kidney: Length: 11.3 cm. No hydronephrosis. Mild increased echogenicity.  Left Kidney: Length: 11.7 cm. Poorly evaluated. No gross hydronephrosis.  Abdominal aorta: No aneurysm visualized.  Other findings: Bilateral pleural effusions.  IMPRESSION: 1. Gallbladder sludge and gallbladder distention, without specific evidence of acute cholecystitis. 2. Upper normal common duct size with mild intrahepatic duct dilatation in the left hepatic lobe. No obstructive mass or stone seen on recent CT. If bilirubin is elevated, MRCP may be informative to exclude otherwise occult obstruction. 3. Small bilateral  pleural effusions. 4. Suboptimal evaluation of the left kidney. 5. Increased echogenicity within the right kidney may represent medical renal disease.   Electronically Signed   By: Abigail Miyamoto M.D.   On: 01/11/2015 19:34   Dg Chest Port 1 View  01/15/2015   CLINICAL DATA:  Shortness of Breath  EXAM: PORTABLE CHEST - 1 VIEW  COMPARISON:  January 13, 2015  FINDINGS: There has been considerable clearing of interstitial edema compared to 2 days prior. There remains left lower lobe consolidation with left effusion. There is a smaller right effusion. There is atelectatic change in the right mid lung. Heart is upper normal in size with pulmonary vascularity within normal limits. No adenopathy.  IMPRESSION: Considerable clearing of interstitial edema compared to recent prior study. Persistent left lower lobe consolidation with small left effusion. A smaller right effusion is also present. Right midlung atelectatic change present.   Electronically Signed   By: Lowella Grip III M.D.   On: 01/15/2015 07:14   Dg Chest Port 1 View  01/13/2015   CLINICAL DATA:  Pulmonary infiltrate  EXAM: PORTABLE CHEST - 1 VIEW  COMPARISON:  01/11/2015  FINDINGS: Stable positioning of left IJ central line, tip at the upper cavoatrial junction.  Stable borderline cardiomegaly.  Negative aortic and hilar contours.  Increased hazy density over the lower chest compatible with pleural fluid. The change could reflect increase or differential layering. Diffuse interstitial opacities compatible with edema and confirmed on abdominal CT 01/09/2015.  IMPRESSION: Pulmonary edema with bilateral pleural effusion. Left pleural fluid could have increased or layered.   Electronically Signed   By: Monte Fantasia M.D.   On: 01/13/2015 06:12   Dg Chest Portable 1 View  01/11/2015   CLINICAL DATA:  Hypoxia.  Septic shock.  Pyelonephritis.  EXAM: PORTABLE CHEST - 1 VIEW  COMPARISON:  Chest x-rays dated 01/10/2015, 01/09/2015 and 05/08/2007  FINDINGS: Central  line appears in good position at the cavoatrial junction. Increasing bilateral pulmonary infiltrates with new bilateral pleural effusions. The findings consistent with pulmonary edema. The possibility of ARDS should be considered. Heart size is normal. Slight pulmonary vascular prominence.  IMPRESSION: Progressive bilateral pulmonary infiltrates and pleural effusions. Findings are consistent with pulmonary edema.   Electronically Signed   By: Lorriane Shire M.D.   On: 01/11/2015 13:32   Dg Abd Portable 1v  01/14/2015   CLINICAL DATA:  Pyelonephritis.  Nephrostomy.  EXAM: PORTABLE ABDOMEN - 1 VIEW  COMPARISON:  None.  FINDINGS: A left nephrostomy tube is noted with a single well in the end of the tube in the expected vicinity of the left collecting system.  There is a potential calcification projecting over the more medial portion of the coil, measuring up to 6 mm in long axis.  Bowel gas pattern unremarkable.  IMPRESSION: 1. Left nephrostomy tube with single cul well in the expected position of the left collecting system. A calculus projects over the medial margin of the loop.   Electronically Signed   By: Van Clines M.D.   On: 01/14/2015 19:29    Labs:  CBC:  Recent Labs  01/12/15 0353 01/13/15 0400 01/14/15  0245 01/15/15 0317  WBC 26.7* 42.8* 55.3* 55.6*  HGB 12.8 13.8 13.2 12.3  HCT 36.3 38.8 37.8 35.5*  PLT 24* 24* 29* 53*    COAGS:  Recent Labs  01/10/15 0050 01/12/15 1146  INR 2.22* 1.36    BMP:  Recent Labs  01/12/15 0353 01/13/15 0400 01/14/15 0245 01/15/15 0317  NA 137 140 140 143  K 4.6 3.8 3.7 2.8*  CL 107 103 104 107  CO2 20* 23 24 25   GLUCOSE 121* 85 112* 119*  BUN 47* 69* 91* 94*  CALCIUM 7.8* 8.0* 7.9* 7.8*  CREATININE 3.21* 3.76* 3.43* 2.80*  GFRNONAA 14* 12* 13* 17*  GFRAA 16* 14* 15* 19*    LIVER FUNCTION TESTS:  Recent Labs  01/09/15 2100 01/10/15 0050 01/11/15 0648 01/13/15 0400  BILITOT 1.3* 1.1 1.5* 1.2  AST 42* 49* 54* 38  ALT  20 23 13* 8*  ALKPHOS 270* 154* 106 231*  PROT 5.4* 4.4* 4.1* 4.8*  ALBUMIN 3.2* 2.5* 1.9* 2.0*    Assessment and Plan:  S/P PCN on 7/1  WBC up to 55.6. Urosepsis and pt on Solu Cortef  Chest and Abd Xrays seen.  Prob PNA on CXR, on Rocephin.   PCN ok on  Xray.  Continue care per CCM  Signed: Fay Swider S Erilyn Pearman PA-C 01/15/2015, 8:59 AM   I spent a total of 15 Minutes in face to face in clinical consultation/evaluation, greater than 50% of which was counseling/coordinating care for left PCN placed 7/1

## 2015-01-15 NOTE — Progress Notes (Signed)
Speech Language Pathology Treatment: Dysphagia  Patient Details Name: Becky Rogers MRN: [id number] DOB: October 12, 1950 Today's Date: 01/15/2015 Time: 0175-1025 SLP Time Calculation (min) (ACUTE ONLY): 20 min  Assessment / Plan / Recommendation Clinical Impression  Pt with effective oral prep, cohesion, mastication and transit to posterior oral cavity without residue. Consistent throat clear that does not appear related to po's. Pt reported frequent throat clearing after thyroid surgery (?). CXR without suggestion of infiltrated, afebrile and lung sounds stable (per RN documentation). Reviewed swallow strategies and upgraded consistency to regular, continue thin. ST will sign off. Possible admission to rehab. Pt  would benefit from cognitive assessment on rehab due to confusion/memory/awareness.   HPI Other Pertinent Information: 64 yo female wtih parkinsons dz presented 6/30 with abdominal pain.   Pertinent Vitals Pain Assessment: No/denies pain  SLP Plan  Discharge SLP treatment due to (comment)    Recommendations Diet recommendations: Regular;Thin liquid Liquids provided via: Cup;Straw Medication Administration: Whole meds with liquid Supervision: Patient able to self feed Compensations: Slow rate;Small sips/bites Postural Changes and/or Swallow Maneuvers: Seated upright 90 degrees              General recommendations: Rehab consult Oral Care Recommendations: Oral care BID Follow up Recommendations:  (rehab for cognition) Plan: Discharge SLP treatment due to (comment)    GO     Houston Siren 01/15/2015, 11:33 AM  Orbie Pyo Colvin Caroli.Ed [employer] [phone]

## 2015-01-15 NOTE — Progress Notes (Signed)
Rehab admissions - Evaluated for possible admission.  I am submitting clinicals to [facility] requesting acute inpatient rehab admission.  Will need OT consult completed.  I called and left a message with husband, but no return call yet.  Call me for questions.  #[phone]

## 2015-01-15 NOTE — Progress Notes (Signed)
Pt voided after removing Foley.

## 2015-01-15 NOTE — Progress Notes (Signed)
[facility] TEAM 1 - Stepdown/ICU TEAM Progress Note  Becky Rogers AOZ:[id number] DOB: 1950-10-10 DOA: 01/09/2015 PCP: Pcp Not In System  Admit HPI / Brief Narrative: 64 year old female with parkinsons dz presented 6/30 with abdominal pain. In ED she was profoundly hypotensive which was not responsive to IVF resuscitation. Lactic acid elevation, started on pressors.  Significant Events: 6/30 admitted for presumed sepsis 6/30 CT abd > 57mm L UPJ stone with mild dhydronephrosis, gallbladder sludge vs small stones, peripancreatic fluid. B lower lobe pulmonary opacities.  6/30 left perc nephrostomy tube placed. GNR growing in urine.  7/3 weaned from Pressors 7/3 CT head> no intracranial abnormalities 7/4 SLP eval > dysphagia 2 with thin liquid. 7/4 CXR > Pulmonary Edema with Bilateral Pleural Effusion 7/5Changed diet to dysphagia 3  7/6 diet changed to regular w/ thin liquids  HPI/Subjective: The patient is resting comfortably in a bedside chair.  She denies chest pain shortness of breath nausea vomiting or abdominal pain.  The family reports that she is ever so slightly confused.  Assessment/Plan:  Escherichia coli septic shock Steadily recovering - hemodynamically stable at present  L UPJ stone and hydronephrosis, s/p Left Perc nephrostomy tube 7/1 Urology following  Acute kidney injury Creatinine slowly but steadily improving  Rising leukocytosis Most likely due to high-dose steroids - as patient recovering nicely we'll begin rapid taper of steroids and follow white blood cell count  Ischemic injury to fingers/toes Discussed these findings as a sequelae of her septic shock - advised patient these areas will need to be watched and that she can expect tissue loss/sloughing of skin - warned her/family to watch for wet gangrenous type changes - at present it appears that she could lose the tip of a few toes on each foot but I do not see change that concerns me she can lose a finger  at present  Thrombocytopenia / coagulopathy Due to severe gram-negative rod sepsis - slowly improving  Toxic metabolic encephalopathy Likely a combination of ICU delirium, high-dose steroids dosing, and baseline Parkinson's related mild dementia - expect to continue to slowly improve - transfer out of SDU as soon as possible  Hypokalemia Replace and follow  Possible cholelithiasis - peripancreatic and periportal fluid on CT abdomen No clinical signs at this time of active infection - follow   Acute hypoxic respiratory failure Resolved  Basilar opacifications on CT chest - acute lung injury  Parkinson's disease / mild dementia Resume usual home medications   Code Status: FULL Family Communication: no family present at time of exam Disposition Plan: SDU  Consultants: Urology Interventional Radiology  Antibiotics: Ciprofloxacin 6/30 > 7/2 ceftaz 7/1 > 7/2 Imipenem 7/2 > 7/3 CTX 7/3 >   DVT prophylaxis: SCDs  Objective: Blood pressure 119/56, pulse 54, temperature 97.5 F (36.4 C), temperature source Oral, resp. rate 16, height 5\' 2"  (1.575 m), weight 59 kg (130 lb 1.1 oz), SpO2 97 %.  Intake/Output Summary (Last 24 hours) at 01/15/15 1339 Last data filed at 01/15/15 0600  Gross per 24 hour  Intake    260 ml  Output   1550 ml  Net  -1290 ml   Exam: General: No acute respiratory distress - very mildly confused but able to carry on a conversation  Lungs: Clear to auscultation bilaterally without wheezes or crackles Cardiovascular: Regular rate and rhythm without murmur gallop or rub - normal S1 and S2 Abdomen: Nontender, nondistended, soft, bowel sounds positive, no rebound, no ascites, no appreciable mass - perc neph  drain in place in L flank  Extremities: No significant cyanosis, clubbing, or edema bilateral lower extremities - ischemic type change in toes and fingers of B hands - no evidence of necrosis thus far   Data Reviewed: Basic Metabolic Panel:  Recent  Labs Lab 01/10/15 0050  01/11/15 0648 01/11/15 1400 01/12/15 0353 01/13/15 0400 01/14/15 0245 01/15/15 0317  NA 138  < > 132* 135 137 140 140 143  K 3.0*  < > 4.3 4.5 4.6 3.8 3.7 2.8*  CL 108  < > 107 110 107 103 104 107  CO2 15*  < > 14* 17* 20* 23 24 25   GLUCOSE 150*  < > 119* 102* 121* 85 112* 119*  BUN 38*  < > 47* 45* 47* 69* 91* 94*  CREATININE 2.62*  < > 3.23* 3.23* 3.21* 3.76* 3.43* 2.80*  CALCIUM 6.9*  < > 7.0* 7.5* 7.8* 8.0* 7.9* 7.8*  MG 1.4*  --  1.8  --   --   --   --  2.0  PHOS 2.9  --   --   --   --   --   --  3.7  < > = values in this interval not displayed.  CBC:  Recent Labs Lab 01/11/15 0648 01/12/15 0353 01/13/15 0400 01/14/15 0245 01/15/15 0317  WBC 21.4* 26.7* 42.8* 55.3* 55.6*  HGB 13.6 12.8 13.8 13.2 12.3  HCT 37.3 36.3 38.8 37.8 35.5*  MCV 84.4 85.0 84.9 84.9 85.5  PLT 34* 24* 24* 29* 53*    Liver Function Tests:  Recent Labs Lab 01/09/15 2100 01/10/15 0050 01/11/15 0648 01/13/15 0400  AST 42* 49* 54* 38  ALT 20 23 13* 8*  ALKPHOS 270* 154* 106 231*  BILITOT 1.3* 1.1 1.5* 1.2  PROT 5.4* 4.4* 4.1* 4.8*  ALBUMIN 3.2* 2.5* 1.9* 2.0*    Coags:  Recent Labs Lab 01/10/15 0050 01/12/15 1146  INR 2.22* 1.36   CBG:  Recent Labs Lab 01/13/15 1653 01/13/15 1959 01/14/15 0021 01/14/15 0754 01/14/15 2016  GLUCAP 92 112* 99 101* 115*    Recent Results (from the past 240 hour(s))  Blood culture (routine x 2)     Status: None   Collection Time: 01/09/15  9:27 PM  Result Value Ref Range Status   Specimen Description BLOOD RIGHT ARM  Final   Special Requests BOTTLES DRAWN AEROBIC AND ANAEROBIC 5CC  Final   Culture  Setup Time   Final    GRAM NEGATIVE RODS IN BOTH AEROBIC AND ANAEROBIC BOTTLES CRITICAL RESULT CALLED TO, READ BACK BY AND VERIFIED WITH: Mariea Clonts, RN AT 1638 GT [id number] BY S. YARBROUGH    Culture ESCHERICHIA COLI  Final   Report Status 01/12/2015 FINAL  Final   Organism ID, Bacteria ESCHERICHIA COLI  Final       Susceptibility   Escherichia coli - MIC*    AMPICILLIN 8 SENSITIVE Sensitive     CEFAZOLIN <=4 SENSITIVE Sensitive     CEFEPIME <=1 SENSITIVE Sensitive     CEFTAZIDIME <=1 SENSITIVE Sensitive     CEFTRIAXONE <=1 SENSITIVE Sensitive     CIPROFLOXACIN <=0.25 SENSITIVE Sensitive     GENTAMICIN <=1 SENSITIVE Sensitive     IMIPENEM <=0.25 SENSITIVE Sensitive     TRIMETH/SULFA <=20 SENSITIVE Sensitive     AMPICILLIN/SULBACTAM 4 SENSITIVE Sensitive     PIP/TAZO <=4 SENSITIVE Sensitive     * ESCHERICHIA COLI  Blood culture (routine x 2)     Status: None   Collection  Time: 01/09/15  9:34 PM  Result Value Ref Range Status   Specimen Description BLOOD RIGHT HAND  Final   Special Requests BOTTLES DRAWN AEROBIC ONLY 3CC  Final   Culture  Setup Time   Final    GRAM NEGATIVE RODS AEROBIC BOTTLE ONLY CRITICAL RESULT CALLED TO, READ BACK BY AND VERIFIED WITH: T HARVEY 01/10/15 @ 92 M VESTAL    Culture   Final    ESCHERICHIA COLI SUSCEPTIBILITIES PERFORMED ON PREVIOUS CULTURE WITHIN THE LAST 5 DAYS.    Report Status 01/13/2015 FINAL  Final  MRSA PCR Screening     Status: None   Collection Time: 01/09/15 11:52 PM  Result Value Ref Range Status   MRSA by PCR NEGATIVE NEGATIVE Final    Comment:        The GeneXpert MRSA Assay (FDA approved for NASAL specimens only), is one component of a comprehensive MRSA colonization surveillance program. It is not intended to diagnose MRSA infection nor to guide or monitor treatment for MRSA infections.   Urine culture     Status: None   Collection Time: 01/10/15  1:46 AM  Result Value Ref Range Status   Specimen Description URINE, CATHETERIZED  Final   Special Requests NONE  Final   Culture >=100,000 COLONIES/mL ESCHERICHIA COLI  Final   Report Status 01/12/2015 FINAL  Final   Organism ID, Bacteria ESCHERICHIA COLI  Final      Susceptibility   Escherichia coli - MIC*    AMPICILLIN 8 SENSITIVE Sensitive     CEFAZOLIN <=4 SENSITIVE Sensitive      CEFTRIAXONE <=1 SENSITIVE Sensitive     CIPROFLOXACIN <=0.25 SENSITIVE Sensitive     GENTAMICIN <=1 SENSITIVE Sensitive     IMIPENEM <=0.25 SENSITIVE Sensitive     NITROFURANTOIN <=16 SENSITIVE Sensitive     TRIMETH/SULFA <=20 SENSITIVE Sensitive     AMPICILLIN/SULBACTAM 4 SENSITIVE Sensitive     PIP/TAZO <=4 SENSITIVE Sensitive     * >=100,000 COLONIES/mL ESCHERICHIA COLI  Clostridium Difficile by PCR (not at [facility])     Status: None   Collection Time: 01/11/15 11:08 AM  Result Value Ref Range Status   C difficile by pcr NEGATIVE NEGATIVE Final     Studies:   Recent x-ray studies have been reviewed in detail by the Attending Physician  Scheduled Meds:  Scheduled Meds: . carbidopa-levodopa  2 tablet Oral TID  . cefTRIAXone (ROCEPHIN)  IV  2 g Intravenous Q24H  . hydrocortisone sodium succinate  50 mg Intravenous Q6H  . trihexyphenidyl  2 mg Oral BID WC    Time spent on care of this patient: 35 mins   MCCLUNG,JEFFREY T , MD   [facility] Office  [phone] Pager - Text Page per Shea Evans as per below:  On-Call/Text Page:      [email or web address]      password [email or web address]  If 7PM-7AM, please contact night-coverage [email or web address] Password [email or web address] 01/15/2015, 1:39 PM   LOS: 6 days

## 2015-01-15 NOTE — Evaluation (Signed)
Occupational Therapy Evaluation Patient Details Name: Becky Rogers MRN: [id number] DOB: [birth year] Today's Date: 01/15/2015    History of Present Illness 64 year old female with parkinsons dz presented 6/30 with abdominal pain. In ED she was profoundly hypotensive which was not responsive to IVF resuscitation. Lactic acid elevation, started on pressors. AMS noted. Pt with sepsis, pleural effusion, nephrostomy tube and decreaed bil LE circulation secondary to pressors   Clinical Impression   Pt was independent prior to admission.  Presents with generalized weakness, impaired cognition and decreased balance interfering with ability to perform mobility and ADL at her baseline.  Pt has potential to return to modified independent to independent level with intensive rehab.  Will follow acutely.    Follow Up Recommendations  CIR    Equipment Recommendations       Recommendations for Other Services       Precautions / Restrictions Precautions Precautions: Fall      Mobility Bed Mobility               General bed mobility comments: pt up in chair  Transfers Overall transfer level: Needs assistance Equipment used: 1 person hand held assist Transfers: Sit to/from Stand Sit to Stand: Min guard         General transfer comment: impulsive with sit to stand    Balance     Sitting balance-Leahy Scale: Good       Standing balance-Leahy Scale: Fair                              ADL Overall ADL's : Needs assistance/impaired Eating/Feeding: Sitting;Supervision/ safety   Grooming: Wash/dry hands;Wash/dry face;Sitting;Set up   Upper Body Bathing: Minimal assitance;Sitting   Lower Body Bathing: Minimal assistance;Sit to/from stand   Upper Body Dressing : Minimal assistance;Sitting   Lower Body Dressing: Minimal assistance;Sit to/from stand   Toilet Transfer: Minimal assistance;Ambulation;BSC   Toileting- Clothing Manipulation and Hygiene: Minimal  assistance;Sit to/from stand       Functional mobility during ADLs: Minimal assistance General ADL Comments: able to donn and doff socks     Vision     Perception     Praxis      Pertinent Vitals/Pain Pain Assessment: No/denies pain     Hand Dominance Right   Extremity/Trunk Assessment Upper Extremity Assessment Upper Extremity Assessment: Generalized weakness (discoloration of fingers from septic shock)   Lower Extremity Assessment Lower Extremity Assessment: Defer to PT evaluation       Communication Communication Communication: No difficulties   Cognition Arousal/Alertness: Awake/alert Behavior During Therapy: Impulsive Overall Cognitive Status: Impaired/Different from baseline Area of Impairment: Orientation;Memory;Safety/judgement;Awareness;Problem solving Orientation Level: Disoriented to;Time;Situation Current Attention Level: Selective Memory: Decreased short-term memory   Safety/Judgement: Decreased awareness of safety;Decreased awareness of deficits Awareness: Intellectual Problem Solving: Decreased initiation;Difficulty sequencing;Requires verbal cues General Comments: Pt used newspaper to locate date.   General Comments       Exercises       Shoulder Instructions      Home Living Family/patient expects to be discharged to:: Private residence Living Arrangements: Spouse/significant other Available Help at Discharge: Family;Available PRN/intermittently Type of Home: House             Bathroom Shower/Tub: Walk-in Corporate treasurer Toilet: Standard     Home Equipment: None   Additional Comments: Pt's husband works full-time. He performs housekeeping. Pt drives, performs light meal prep and self care independently.  Daughter in room  and stated pt drove to her home in [place] and cared for her after and illness just 3 weeks ago.      Prior Functioning/Environment Level of Independence: Independent             OT Diagnosis:  Generalized weakness;Cognitive deficits;Acute pain   OT Problem List: Decreased strength;Decreased activity tolerance;Impaired balance (sitting and/or standing);Decreased cognition;Decreased knowledge of use of DME or AE   OT Treatment/Interventions: Self-care/ADL training;DME and/or AE instruction;Therapeutic activities;Cognitive remediation/compensation;Patient/family education;Balance training    OT Goals(Current goals can be found in the care plan section) Acute Rehab OT Goals Patient Stated Goal: walk her 2 large dogs OT Goal Formulation: With patient Time For Goal Achievement: 01/29/15 Potential to Achieve Goals: Good ADL Goals Pt Will Perform Grooming: with modified independence;standing Pt Will Perform Upper Body Bathing: sitting;with modified independence Pt Will Perform Lower Body Bathing: sit to/from stand;with modified independence Pt Will Perform Upper Body Dressing: sitting;with modified independence Pt Will Perform Lower Body Dressing: with modified independence;sit to/from stand Pt Will Transfer to Toilet: with modified independence;ambulating;regular height toilet Pt Will Perform Toileting - Clothing Manipulation and hygiene: with modified independence;sit to/from stand Additional ADL Goal #1: Pt will use memory strategies for orientation to time and situation.  OT Frequency: Min 2X/week   Barriers to D/C:            Co-evaluation              End of Session    Activity Tolerance: Patient tolerated treatment well Patient left: in chair;with call bell/phone within reach;with chair alarm set;with family/visitor present   Time: 6659-9357 OT Time Calculation (min): 20 min Charges:  OT General Charges $OT Visit: 1 Procedure OT Evaluation $Initial OT Evaluation Tier I: 1 Procedure G-Codes:    Malka So 01/15/2015, 1:12 PM  [phone]

## 2015-01-16 ENCOUNTER — Inpatient Hospital Stay (HOSPITAL_COMMUNITY)
Admission: AD | Admit: 2015-01-16 | Discharge: 2015-01-25 | DRG: 947 | Disposition: A | Discharge status: Home / Self Care | Payer: BLUE CROSS/BLUE SHIELD | Source: Intra-hospital | Attending: Physical Medicine & Rehabilitation | Admitting: Physical Medicine & Rehabilitation

## 2015-01-16 DIAGNOSIS — G4700 Insomnia, unspecified: Secondary | ICD-10-CM | POA: Diagnosis present

## 2015-01-16 DIAGNOSIS — A419 Sepsis, unspecified organism: Secondary | ICD-10-CM

## 2015-01-16 DIAGNOSIS — E876 Hypokalemia: Secondary | ICD-10-CM | POA: Diagnosis present

## 2015-01-16 DIAGNOSIS — R5381 Other malaise: Secondary | ICD-10-CM | POA: Diagnosis present

## 2015-01-16 DIAGNOSIS — R918 Other nonspecific abnormal finding of lung field: Secondary | ICD-10-CM

## 2015-01-16 DIAGNOSIS — D72829 Elevated white blood cell count, unspecified: Secondary | ICD-10-CM | POA: Diagnosis present

## 2015-01-16 DIAGNOSIS — B9620 Unspecified Escherichia coli [E. coli] as the cause of diseases classified elsewhere: Secondary | ICD-10-CM | POA: Diagnosis present

## 2015-01-16 DIAGNOSIS — G20 Parkinson's disease: Secondary | ICD-10-CM | POA: Diagnosis present

## 2015-01-16 DIAGNOSIS — N179 Acute kidney failure, unspecified: Secondary | ICD-10-CM

## 2015-01-16 DIAGNOSIS — R7881 Bacteremia: Secondary | ICD-10-CM | POA: Diagnosis not present

## 2015-01-16 DIAGNOSIS — I10 Essential (primary) hypertension: Secondary | ICD-10-CM | POA: Diagnosis present

## 2015-01-16 DIAGNOSIS — G20A1 Parkinson's disease without dyskinesia, without mention of fluctuations: Secondary | ICD-10-CM | POA: Diagnosis present

## 2015-01-16 DIAGNOSIS — N12 Tubulo-interstitial nephritis, not specified as acute or chronic: Secondary | ICD-10-CM | POA: Diagnosis present

## 2015-01-16 DIAGNOSIS — R609 Edema, unspecified: Secondary | ICD-10-CM | POA: Diagnosis not present

## 2015-01-16 DIAGNOSIS — L97519 Non-pressure chronic ulcer of other part of right foot with unspecified severity: Secondary | ICD-10-CM | POA: Diagnosis present

## 2015-01-16 DIAGNOSIS — G9341 Metabolic encephalopathy: Secondary | ICD-10-CM | POA: Diagnosis present

## 2015-01-16 DIAGNOSIS — N390 Urinary tract infection, site not specified: Secondary | ICD-10-CM | POA: Diagnosis present

## 2015-01-16 DIAGNOSIS — Z853 Personal history of malignant neoplasm of breast: Secondary | ICD-10-CM | POA: Diagnosis not present

## 2015-01-16 DIAGNOSIS — R6521 Severe sepsis with septic shock: Secondary | ICD-10-CM

## 2015-01-16 DIAGNOSIS — J9601 Acute respiratory failure with hypoxia: Secondary | ICD-10-CM | POA: Diagnosis present

## 2015-01-16 DIAGNOSIS — R531 Weakness: Secondary | ICD-10-CM | POA: Diagnosis present

## 2015-01-16 DIAGNOSIS — I998 Other disorder of circulatory system: Secondary | ICD-10-CM | POA: Diagnosis present

## 2015-01-16 DIAGNOSIS — L97529 Non-pressure chronic ulcer of other part of left foot with unspecified severity: Secondary | ICD-10-CM

## 2015-01-16 DIAGNOSIS — D696 Thrombocytopenia, unspecified: Secondary | ICD-10-CM | POA: Diagnosis present

## 2015-01-16 DIAGNOSIS — R410 Disorientation, unspecified: Secondary | ICD-10-CM

## 2015-01-16 LAB — COMPREHENSIVE METABOLIC PANEL
ALBUMIN: 2.1 g/dL — AB (ref 3.5–5.0)
ALT: 8 U/L — ABNORMAL LOW (ref 14–54)
ANION GAP: 9 (ref 5–15)
AST: 38 U/L (ref 15–41)
Alkaline Phosphatase: 206 U/L — ABNORMAL HIGH (ref 38–126)
BILIRUBIN TOTAL: 0.8 mg/dL (ref 0.3–1.2)
BUN: 78 mg/dL — AB (ref 6–20)
CHLORIDE: 110 mmol/L (ref 101–111)
CO2: 24 mmol/L (ref 22–32)
CREATININE: 2.03 mg/dL — AB (ref 0.44–1.00)
Calcium: 7.6 mg/dL — ABNORMAL LOW (ref 8.9–10.3)
GFR calc Af Amer: 29 mL/min — ABNORMAL LOW (ref >60)
GFR calc non Af Amer: 25 mL/min — ABNORMAL LOW (ref >60)
Glucose, Bld: 103 mg/dL — ABNORMAL HIGH (ref 65–99)
POTASSIUM: 3.3 mmol/L — AB (ref 3.5–5.1)
Sodium: 143 mmol/L (ref 135–145)
TOTAL PROTEIN: 4.6 g/dL — AB (ref 6.5–8.1)

## 2015-01-16 LAB — CBC
HEMATOCRIT: 35 % — AB (ref 36.0–46.0)
Hemoglobin: 11.9 g/dL — ABNORMAL LOW (ref 12.0–15.0)
MCH: 29.3 pg (ref 26.0–34.0)
MCHC: 34 g/dL (ref 30.0–36.0)
MCV: 86.2 fL (ref 78.0–100.0)
PLATELETS: 76 10*3/uL — AB (ref 150–400)
RBC: 4.06 MIL/uL (ref 3.87–5.11)
RDW: 14.4 % (ref 11.5–15.5)
WBC: 43.8 10*3/uL — AB (ref 4.0–10.5)

## 2015-01-16 LAB — PROCALCITONIN: Procalcitonin: 4.01 ng/mL

## 2015-01-16 LAB — LACTIC ACID, PLASMA: Lactic Acid, Venous: 1.7 mmol/L (ref 0.5–2.0)

## 2015-01-16 MED ORDER — TRIHEXYPHENIDYL HCL 2 MG PO TABS
2.0000 mg | ORAL_TABLET | Freq: Two times a day (BID) | ORAL | Status: DC
  Administered 2015-01-17 – 2015-01-25 (×16): 2 mg via ORAL
  Filled 2015-01-16 (×20): qty 1

## 2015-01-16 MED ORDER — CEFTRIAXONE SODIUM IN DEXTROSE 40 MG/ML IV SOLN
2.0000 g | INTRAVENOUS | Status: DC
  Administered 2015-01-17 – 2015-01-24 (×9): 2 g via INTRAVENOUS
  Filled 2015-01-16 (×11): qty 50

## 2015-01-16 MED ORDER — HYDROCORTISONE NA SUCCINATE PF 100 MG IJ SOLR
25.0000 mg | Freq: Every day | INTRAMUSCULAR | Status: AC
  Administered 2015-01-18 – 2015-01-19 (×2): 25 mg via INTRAVENOUS
  Filled 2015-01-16 (×2): qty 0.5

## 2015-01-16 MED ORDER — CEFTRIAXONE SODIUM IN DEXTROSE 40 MG/ML IV SOLN: 2.0000 g | INTRAVENOUS | Status: DC

## 2015-01-16 MED ORDER — BISACODYL 10 MG RE SUPP: 10.0000 mg | Freq: Every day | RECTAL | Status: DC | PRN

## 2015-01-16 MED ORDER — HYDROCORTISONE NA SUCCINATE PF 100 MG IJ SOLR: 25.0000 mg | Freq: Two times a day (BID) | INTRAMUSCULAR | Status: DC

## 2015-01-16 MED ORDER — FLEET ENEMA 7-19 GM/118ML RE ENEM: 1.0000 | ENEMA | Freq: Once | RECTAL | Status: AC | PRN

## 2015-01-16 MED ORDER — ALUM & MAG HYDROXIDE-SIMETH 200-200-20 MG/5ML PO SUSP: 30.0000 mL | ORAL | Status: DC | PRN

## 2015-01-16 MED ORDER — PRO-STAT SUGAR FREE PO LIQD
30.0000 mL | Freq: Three times a day (TID) | ORAL | Status: DC
  Administered 2015-01-16 – 2015-01-17 (×2): 30 mL via ORAL
  Filled 2015-01-16 (×5): qty 30

## 2015-01-16 MED ORDER — GUAIFENESIN-DM 100-10 MG/5ML PO SYRP: 5.0000 mL | ORAL_SOLUTION | Freq: Four times a day (QID) | ORAL | Status: DC | PRN

## 2015-01-16 MED ORDER — ONDANSETRON HCL 4 MG PO TABS: 4.0000 mg | ORAL_TABLET | Freq: Four times a day (QID) | ORAL | Status: DC | PRN

## 2015-01-16 MED ORDER — ONDANSETRON HCL 4 MG/2ML IJ SOLN: 4.0000 mg | Freq: Four times a day (QID) | INTRAMUSCULAR | Status: DC | PRN

## 2015-01-16 MED ORDER — ACETAMINOPHEN 325 MG PO TABS
325.0000 mg | ORAL_TABLET | ORAL | Status: DC | PRN
  Filled 2015-01-16: qty 2

## 2015-01-16 MED ORDER — DIPHENHYDRAMINE HCL 12.5 MG/5ML PO ELIX
12.5000 mg | ORAL_SOLUTION | Freq: Four times a day (QID) | ORAL | Status: DC | PRN
Start: 2015-01-16 — End: 2015-01-25

## 2015-01-16 MED ORDER — POTASSIUM CHLORIDE CRYS ER 20 MEQ PO TBCR: 40.0000 meq | EXTENDED_RELEASE_TABLET | Freq: Every day | ORAL | Status: DC

## 2015-01-16 MED ORDER — CLONAZEPAM 0.5 MG PO TABS
0.5000 mg | ORAL_TABLET | Freq: Every day | ORAL | Status: DC
  Administered 2015-01-16 – 2015-01-24 (×9): 0.5 mg via ORAL
  Filled 2015-01-16 (×9): qty 1

## 2015-01-16 MED ORDER — HYDROCORTISONE NA SUCCINATE PF 100 MG IJ SOLR
25.0000 mg | Freq: Two times a day (BID) | INTRAMUSCULAR | Status: DC
  Filled 2015-01-16: qty 0.5

## 2015-01-16 MED ORDER — CARBIDOPA-LEVODOPA 25-100 MG PO TABS
2.0000 | ORAL_TABLET | Freq: Three times a day (TID) | ORAL | Status: DC
  Administered 2015-01-16 – 2015-01-25 (×26): 2 via ORAL
  Filled 2015-01-16 (×30): qty 2

## 2015-01-16 MED ORDER — POTASSIUM CHLORIDE CRYS ER 20 MEQ PO TBCR
40.0000 meq | EXTENDED_RELEASE_TABLET | Freq: Once | ORAL | Status: AC
  Administered 2015-01-16: 40 meq via ORAL
  Filled 2015-01-16: qty 2

## 2015-01-16 MED ORDER — HYDROCORTISONE NA SUCCINATE PF 100 MG IJ SOLR
25.0000 mg | Freq: Two times a day (BID) | INTRAMUSCULAR | Status: AC
  Administered 2015-01-16 – 2015-01-18 (×4): 25 mg via INTRAVENOUS
  Filled 2015-01-16 (×4): qty 0.5

## 2015-01-16 NOTE — Progress Notes (Signed)
Transferred to Rehab

## 2015-01-16 NOTE — Progress Notes (Signed)
Report called to [place], [place]

## 2015-01-16 NOTE — Progress Notes (Signed)
Rehab admissions - I have medical clearance from attending and will admit to acute inpatient rehab today.  We do have bed available today.  Call me for questions.  #[phone]

## 2015-01-16 NOTE — Progress Notes (Signed)
Rehab admissions - I have authorization for acute inpatient rehab admission.  I can admit today or tomorrow if patient is medically stable.  Call me for questions.  #[phone]

## 2015-01-16 NOTE — H&P (View-Only) (Signed)
Physical Medicine and Rehabilitation Admission H&P    Chief Complaint  Patient presents with  . Deconditioning due to multiple medical issues   HPI:   Becky Rogers is a 64 y.o. female with history of Parkinson's disease with mild dementia, breast cancer, HTN who was admitted 01/09/15 with abdominal pain, acute renal failure and hypotension due to urosepsis. She was started on pressors, solucortef and fluids as well as broad spectrum antibiotics for septic shock. CT revealed left ureteral stone with concerns for obstructed pyonephrosis and underwent left percutaneous nephrostomy by interventional radiology. Dr. Alinda Money recommended 14 days of culture specific antibiotics prior to any definitive stone procedure and follow up with Dr. Tresa Moore on outpatient basis. Antibiotics changed to ceftriaxone for pansensitive E coli bacteremia. Mentation is improving. Acute renal failure slowly improving and thrombocytopenia/coagulopathy resolving. Ischemic injury to bilateral finger and toes/feet being monitored for demarcation/gangrenous changes. Swallow evaluation revealed mild cognitively based dysphagia and was placed on esophageal/aspiration precautions. Therapy ongoing and CIR recommended by MD and Rehab team.    Review of Systems  HENT: Negative for hearing loss.   Eyes: Negative for blurred vision and double vision.  Respiratory: Negative for cough and shortness of breath.   Cardiovascular: Negative for chest pain and palpitations.  Gastrointestinal: Negative for heartburn and nausea.  Genitourinary: Negative for dysuria, urgency and frequency.  Musculoskeletal: Positive for myalgias. Negative for back pain.  Neurological: Negative for dizziness, tremors and headaches.  Psychiatric/Behavioral: Positive for memory loss. The patient has insomnia (has outbursts during sleep. ).    Past Medical History  Diagnosis Date  . Hypertension   . Hypercholesteremia   . Parkinson disease   . Breast  cancer     right   Past Surgical History  Procedure Laterality Date  . Cesarean section      x2  . Ovarian cyst removal      age 15  . Appendectomy    . Colonoscopy    . Dental surgery      implant  . Breast lumpectomy with radioactive seed localization Right 08/28/2014    Procedure: RIGHT BREAST LUMPECTOMY WITH RADIOACTIVE SEED LOCALIZATION;  Surgeon: [name], MD;  Location: [facility];  Service: General;  Laterality: Right;     History reviewed. No pertinent family history.    Social History:  Married. Independent PTA--husband does home management/cooking.   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 She reports that she has never smoked. She does not have any smokeless tobacco history on file. She reports that she has a martini on Sundays.  She reports that she does not use illicit drugs.    Allergies: No Known Allergies    Medications Prior to Admission  Medication Sig Dispense Refill  . amLODipine (NORVASC) 5 MG tablet Take 5 mg by mouth daily.    Marland Kitchen aspirin 81 MG tablet Take 81 mg by mouth daily.    Marland Kitchen atorvastatin (LIPITOR) 40 MG tablet Take 40 mg by mouth daily.    Marland Kitchen CALCIUM-VITAMIN D PO Take 2 tablets by mouth daily.    . carbidopa-levodopa (SINEMET IR) 25-100 MG per tablet Take 2 tablets by mouth 3 (three) times daily.    . clonazePAM (KLONOPIN) 0.5 MG tablet Take 0.5 mg by mouth at bedtime.    . fish oil-omega-3 fatty acids 1000 MG capsule Take 3 g by mouth daily.    . Multiple Vitamins-Minerals (MULTIVITAMINS THER. W/MINERALS) TABS Take  1 tablet by mouth daily.    . TRIHEXYPHENIDYL HCL PO Take 1 tablet by mouth 2 (two) times daily.    Marland Kitchen HYDROcodone-acetaminophen (NORCO) 5-325 MG per tablet Take 1-2 tablets by mouth every 6 (six) hours as needed for moderate pain or severe pain. (Patient not taking: Reported on 01/09/2015) 30 tablet 0    Home: Home Living Family/patient expects to be discharged to:: Private  residence Living Arrangements: Spouse/significant other Available Help at Discharge: Family, Available PRN/intermittently Type of Home: House Home Equipment: None Additional Comments: Pt's husband works full-time. He performs housekeeping. Pt drives, performs light meal prep and self care independently.  Daughter in room and stated pt drove to her home in [place] and cared for her after and illness just 3 weeks ago.   Functional History: Prior Function Level of Independence: Independent  Functional Status:  Mobility: Bed Mobility Overal bed mobility: Needs Assistance Bed Mobility: Supine to Sit Supine to sit: Supervision General bed mobility comments: supervision for safety as pt impulsive despite cues Transfers Overall transfer level: Needs assistance Equipment used: 1 person hand held assist Transfers: Sit to/from Stand Sit to Stand: Min guard Stand pivot transfers: Mod assist, +2 physical assistance General transfer comment: impulsive with sit to stand, cues not to pull on RW Ambulation/Gait Ambulation/Gait assistance: Min assist Ambulation Distance (Feet): 300 Feet Assistive device: Rolling walker (2 wheeled), None General Gait Details: pt used RW without LOB with cues for direction and position in RW. Walked half distance without RW with 3 LOb with turning with tactile cues to recover Gait Pattern/deviations: Step-through pattern, Decreased stride length Gait velocity interpretation: Below normal speed for age/gender    ADL: ADL Overall ADL's : Needs assistance/impaired Eating/Feeding: Sitting, Supervision/ safety Grooming: Wash/dry hands, Wash/dry face, Sitting, Set up Upper Body Bathing: Minimal assitance, Sitting Lower Body Bathing: Minimal assistance, Sit to/from stand Upper Body Dressing : Minimal assistance, Sitting Lower Body Dressing: Minimal assistance, Sit to/from stand Toilet Transfer: Minimal assistance, Ambulation, BSC Toileting- Clothing Manipulation  and Hygiene: Minimal assistance, Sit to/from stand Functional mobility during ADLs: Minimal assistance General ADL Comments: able to donn and doff socks  Cognition: Cognition Overall Cognitive Status: Impaired/Different from baseline Orientation Level: Oriented to person, Disoriented to place, Disoriented to time, Disoriented to situation Cognition Arousal/Alertness: Awake/alert Behavior During Therapy: Impulsive Overall Cognitive Status: Impaired/Different from baseline Area of Impairment: Orientation, Memory, Safety/judgement, Awareness, Problem solving Orientation Level: Disoriented to, Time Current Attention Level: Selective Memory: Decreased short-term memory Following Commands: Follows one step commands consistently Safety/Judgement: Decreased awareness of safety, Decreased awareness of deficits Awareness: Intellectual Problem Solving: Decreased initiation, Difficulty sequencing, Requires verbal cues General Comments: Pt used newspaper to locate date.  Physical Exam: Blood pressure 118/65, pulse 69, temperature 97.3 F (36.3 C), temperature source Oral, resp. rate 12, height _0  (1.575 m), weight 59.2 kg (130 lb 8.2 oz), SpO2 99 %. Physical Exam  Nursing note and vitals reviewed. Constitutional: She is oriented to person, place, and time. She appears well-developed and well-nourished.  HENT:  Head: Normocephalic and atraumatic.  Right Ear: External ear normal.  Left Ear: External ear normal.  Mouth/Throat: Oropharynx is clear and moist.  Eyes: Conjunctivae are normal. Pupils are equal, round, and reactive to light.  Neck: Normal range of motion. Neck supple. No JVD present. No tracheal deviation present. No thyromegaly present.  Cardiovascular: Normal rate and regular rhythm.  Exam reveals no friction rub.   No murmur heard. Respiratory: Effort normal. No respiratory distress. She has decreased breath  sounds in the right lower field and the left lower field. She has no  wheezes. She exhibits no tenderness.  GI: Soft. Bowel sounds are normal. She exhibits no distension. There is no tenderness. There is no rebound.  Genitourinary:  Left nephrostomy draining pale yellow urine.   Musculoskeletal: She exhibits edema.  Decrease in edema LUE.  Right antecubital area with sclerotic vein with mild erythema--non tender to touch.   Neurological: She is alert and oriented to person, place, and time. No cranial nerve deficit. Coordination normal.  Oriented to situation with minimal cues. Needs redirection occasionally. Able to follow basic one step commands without difficulty. UE's 3+ prox to 4/5 distally. LE's 2+hf, 3/5 ke and 3+ ankles. Senses pain and LT in both legs. No rigidity or tremor. Masked facies. Delayed processing. No sensory deficits  Skin: Skin is warm and dry.  Ischemic areas distal fingers and toes. Toes more affected. Right 4th and 5th toes with some breakdown in webspace. No gangrene..  Non-tender to touch.    Psychiatric: She has a normal mood and affect. Her behavior is normal.    Results for orders placed or performed during the hospital encounter of 01/09/15 (from the past 48 hour(s))  Procalcitonin - Baseline     Status: None   Collection Time: 01/14/15  2:45 PM  Result Value Ref Range   Procalcitonin 11.19 ng/mL    Comment:        Interpretation: PCT >= 10 ng/mL: Important systemic inflammatory response, almost exclusively due to severe bacterial sepsis or septic shock. (NOTE)         ICU PCT Algorithm               Non ICU PCT Algorithm    ----------------------------     ------------------------------         PCT < 0.25 ng/mL                 PCT < 0.1 ng/mL     Stopping of antibiotics            Stopping of antibiotics       strongly encouraged.               strongly encouraged.    ----------------------------     ------------------------------       PCT level decrease by               PCT < 0.25 ng/mL       >= 80% from peak PCT        OR PCT 0.25 - 0.5 ng/mL          Stopping of antibiotics                                             encouraged.     Stopping of antibiotics           encouraged.    ----------------------------     ------------------------------       PCT level decrease by              PCT >= 0.25 ng/mL       < 80% from peak PCT        AND PCT >= 0.5 ng/mL             Continuing antibiotics  encouraged.       Continuing antibiotics            encouraged.    ----------------------------     ------------------------------     PCT level increase compared          PCT > 0.5 ng/mL         with peak PCT AND          PCT >= 0.5 ng/mL             Escalation of antibiotics                                          strongly encouraged.      Escalation of antibiotics        strongly encouraged.   Lactic acid, plasma     Status: Abnormal   Collection Time: 01/14/15  2:45 PM  Result Value Ref Range   Lactic Acid, Venous 2.6 (HH) 0.5 - 2.0 mmol/L    Comment: REPEATED TO VERIFY CRITICAL RESULT CALLED TO, READ BACK BY AND VERIFIED WITH: E MOORE,RN 1543 01/14/15 D BRADLEY   Glucose, capillary     Status: Abnormal   Collection Time: 01/14/15  8:16 PM  Result Value Ref Range   Glucose-Capillary 115 (H) 65 - 99 mg/dL  Basic metabolic panel     Status: Abnormal   Collection Time: 01/15/15  3:17 AM  Result Value Ref Range   Sodium 143 135 - 145 mmol/L   Potassium 2.8 (L) 3.5 - 5.1 mmol/L    Comment: DELTA CHECK NOTED   Chloride 107 101 - 111 mmol/L   CO2 25 22 - 32 mmol/L   Glucose, Bld 119 (H) 65 - 99 mg/dL   BUN 94 (H) 6 - 20 mg/dL   Creatinine, Ser 2.80 (H) 0.44 - 1.00 mg/dL   Calcium 7.8 (L) 8.9 - 10.3 mg/dL   GFR calc non Af Amer 17 (L) >60 mL/min   GFR calc Af Amer 19 (L) >60 mL/min    Comment: (NOTE) The eGFR has been calculated using the CKD EPI equation. This calculation has not been validated in all clinical situations. eGFR's persistently <60 mL/min  signify possible Chronic Kidney Disease.    Anion gap 11 5 - 15  CBC     Status: Abnormal   Collection Time: 01/15/15  3:17 AM  Result Value Ref Range   WBC 55.6 (HH) 4.0 - 10.5 K/uL    Comment: CRITICAL VALUE NOTED.  VALUE IS CONSISTENT WITH PREVIOUSLY REPORTED AND CALLED VALUE.   RBC 4.15 3.87 - 5.11 MIL/uL   Hemoglobin 12.3 12.0 - 15.0 g/dL   HCT 35.5 (L) 36.0 - 46.0 %   MCV 85.5 78.0 - 100.0 fL   MCH 29.6 26.0 - 34.0 pg   MCHC 34.6 30.0 - 36.0 g/dL   RDW 14.4 11.5 - 15.5 %   Platelets 53 (L) 150 - 400 K/uL    Comment: PLATELET COUNT CONFIRMED BY SMEAR REPEATED TO VERIFY   Magnesium     Status: None   Collection Time: 01/15/15  3:17 AM  Result Value Ref Range   Magnesium 2.0 1.7 - 2.4 mg/dL  Phosphorus     Status: None   Collection Time: 01/15/15  3:17 AM  Result Value Ref Range   Phosphorus 3.7 2.5 - 4.6 mg/dL  Procalcitonin     Status: None  Collection Time: 01/15/15  3:17 AM  Result Value Ref Range   Procalcitonin 7.68 ng/mL    Comment:        Interpretation: PCT > 2 ng/mL: Systemic infection (sepsis) is likely, unless other causes are known. (NOTE)         ICU PCT Algorithm               Non ICU PCT Algorithm    ----------------------------     ------------------------------         PCT < 0.25 ng/mL                 PCT < 0.1 ng/mL     Stopping of antibiotics            Stopping of antibiotics       strongly encouraged.               strongly encouraged.    ----------------------------     ------------------------------       PCT level decrease by               PCT < 0.25 ng/mL       >= 80% from peak PCT       OR PCT 0.25 - 0.5 ng/mL          Stopping of antibiotics                                             encouraged.     Stopping of antibiotics           encouraged.    ----------------------------     ------------------------------       PCT level decrease by              PCT >= 0.25 ng/mL       < 80% from peak PCT        AND PCT >= 0.5 ng/mL             Continuing antibiotics                                               encouraged.       Continuing antibiotics            encouraged.    ----------------------------     ------------------------------     PCT level increase compared          PCT > 0.5 ng/mL         with peak PCT AND          PCT >= 0.5 ng/mL             Escalation of antibiotics                                          strongly encouraged.      Escalation of antibiotics        strongly encouraged.   Lactic acid, plasma     Status: Abnormal   Collection Time: 01/15/15 12:36 PM  Result Value Ref Range   Lactic Acid, Venous 2.4 (HH) 0.5 - 2.0 mmol/L  Comment: REPEATED TO VERIFY CRITICAL RESULT CALLED TO, READ BACK BY AND VERIFIED WITH: SMITH,F RN @ 2800 01/15/15 LEONARD,A   Comprehensive metabolic panel     Status: Abnormal   Collection Time: 01/15/15  4:56 PM  Result Value Ref Range   Sodium 142 135 - 145 mmol/L   Potassium 3.0 (L) 3.5 - 5.1 mmol/L   Chloride 107 101 - 111 mmol/L   CO2 23 22 - 32 mmol/L   Glucose, Bld 123 (H) 65 - 99 mg/dL   BUN 86 (H) 6 - 20 mg/dL   Creatinine, Ser 2.38 (H) 0.44 - 1.00 mg/dL   Calcium 8.0 (L) 8.9 - 10.3 mg/dL   Total Protein 5.0 (L) 6.5 - 8.1 g/dL   Albumin 2.4 (L) 3.5 - 5.0 g/dL   AST 58 (H) 15 - 41 U/L   ALT 21 14 - 54 U/L   Alkaline Phosphatase 247 (H) 38 - 126 U/L   Total Bilirubin 1.0 0.3 - 1.2 mg/dL   GFR calc non Af Amer 20 (L) >60 mL/min   GFR calc Af Amer 24 (L) >60 mL/min    Comment: (NOTE) The eGFR has been calculated using the CKD EPI equation. This calculation has not been validated in all clinical situations. eGFR's persistently <60 mL/min signify possible Chronic Kidney Disease.    Anion gap 12 5 - 15  CBC     Status: Abnormal   Collection Time: 01/16/15  2:16 AM  Result Value Ref Range   WBC 43.8 (H) 4.0 - 10.5 K/uL   RBC 4.06 3.87 - 5.11 MIL/uL   Hemoglobin 11.9 (L) 12.0 - 15.0 g/dL   HCT 35.0 (L) 36.0 - 46.0 %   MCV 86.2 78.0 - 100.0 fL   MCH  29.3 26.0 - 34.0 pg   MCHC 34.0 30.0 - 36.0 g/dL   RDW 14.4 11.5 - 15.5 %   Platelets 76 (L) 150 - 400 K/uL    Comment: CONSISTENT WITH PREVIOUS RESULT  Procalcitonin     Status: None   Collection Time: 01/16/15  2:16 AM  Result Value Ref Range   Procalcitonin 4.01 ng/mL    Comment:        Interpretation: PCT > 2 ng/mL: Systemic infection (sepsis) is likely, unless other causes are known. (NOTE)         ICU PCT Algorithm               Non ICU PCT Algorithm    ----------------------------     ------------------------------         PCT < 0.25 ng/mL                 PCT < 0.1 ng/mL     Stopping of antibiotics            Stopping of antibiotics       strongly encouraged.               strongly encouraged.    ----------------------------     ------------------------------       PCT level decrease by               PCT < 0.25 ng/mL       >= 80% from peak PCT       OR PCT 0.25 - 0.5 ng/mL          Stopping of antibiotics  encouraged.     Stopping of antibiotics           encouraged.    ----------------------------     ------------------------------       PCT level decrease by              PCT >= 0.25 ng/mL       < 80% from peak PCT        AND PCT >= 0.5 ng/mL            Continuing antibiotics                                               encouraged.       Continuing antibiotics            encouraged.    ----------------------------     ------------------------------     PCT level increase compared          PCT > 0.5 ng/mL         with peak PCT AND          PCT >= 0.5 ng/mL             Escalation of antibiotics                                          strongly encouraged.      Escalation of antibiotics        strongly encouraged.   Lactic acid, plasma     Status: None   Collection Time: 01/16/15  2:16 AM  Result Value Ref Range   Lactic Acid, Venous 1.7 0.5 - 2.0 mmol/L  Comprehensive metabolic panel     Status: Abnormal   Collection  Time: 01/16/15  2:16 AM  Result Value Ref Range   Sodium 143 135 - 145 mmol/L   Potassium 3.3 (L) 3.5 - 5.1 mmol/L   Chloride 110 101 - 111 mmol/L   CO2 24 22 - 32 mmol/L   Glucose, Bld 103 (H) 65 - 99 mg/dL   BUN 78 (H) 6 - 20 mg/dL   Creatinine, Ser 2.03 (H) 0.44 - 1.00 mg/dL   Calcium 7.6 (L) 8.9 - 10.3 mg/dL   Total Protein 4.6 (L) 6.5 - 8.1 g/dL   Albumin 2.1 (L) 3.5 - 5.0 g/dL   AST 38 15 - 41 U/L   ALT 8 (L) 14 - 54 U/L   Alkaline Phosphatase 206 (H) 38 - 126 U/L   Total Bilirubin 0.8 0.3 - 1.2 mg/dL   GFR calc non Af Amer 25 (L) >60 mL/min   GFR calc Af Amer 29 (L) >60 mL/min    Comment: (NOTE) The eGFR has been calculated using the CKD EPI equation. This calculation has not been validated in all clinical situations. eGFR's persistently <60 mL/min signify possible Chronic Kidney Disease.    Anion gap 9 5 - 15   Dg Chest Port 1 View  01/15/2015   CLINICAL DATA:  Shortness of Breath  EXAM: PORTABLE CHEST - 1 VIEW  COMPARISON:  January 13, 2015  FINDINGS: There has been considerable clearing of interstitial edema compared to 2 days prior. There remains left lower lobe consolidation with left effusion. There is a smaller right effusion. There is atelectatic change  in the right mid lung. Heart is upper normal in size with pulmonary vascularity within normal limits. No adenopathy.  IMPRESSION: Considerable clearing of interstitial edema compared to recent prior study. Persistent left lower lobe consolidation with small left effusion. A smaller right effusion is also present. Right midlung atelectatic change present.   Electronically Signed   By: William  Woodruff III M.D.   On: 01/15/2015 07:14   Dg Abd Portable 1v  01/14/2015   CLINICAL DATA:  Pyelonephritis.  Nephrostomy.  EXAM: PORTABLE ABDOMEN - 1 VIEW  COMPARISON:  None.  FINDINGS: A left nephrostomy tube is noted with a single well in the end of the tube in the expected vicinity of the left collecting system.  There is a potential  calcification projecting over the more medial portion of the coil, measuring up to 6 mm in long axis.  Bowel gas pattern unremarkable.  IMPRESSION: 1. Left nephrostomy tube with single cul well in the expected position of the left collecting system. A calculus projects over the medial margin of the loop.   Electronically Signed   By: Walter  Liebkemann M.D.   On: 01/14/2015 19:29       Medical Problem List and Plan: 1. Functional deficits secondary to Encephalopathy and debility after sepsis 2.  DVT Prophylaxis/Anticoagulation: Mechanical: Sequential compression devices, below knee Bilateral lower extremities 3. Pain Management: Tylenol prn for pain 4. Mood: LCSW to follow with patient and husband for support. 5. Neuropsych: This patient is not capable of making decisions on her own behalf. 6. Skin/Wound Care: Routine pressure relief measures. Will add protein supplement for depleted protein stores with malnutrition.  7. Fluids/Electrolytes/Nutrition: Monitor I/O. Offer supplements between meals.  8. Left ureteral stone:  Left nephrostomy tube in place with good UOP. 9. AKI: Resolving post L-nephrostomy tube. Continue daily check to monitor for recovery. 10 Urosepsis due to E coli bacteremia/UTI: Continue antibiotics D # 7/ 14 days.  11. Hypokalemia: Will check in am. 12. Leucocytosis: WBC remains elevated. Procalcitonin trending downwards. continue to monitor for trends and signs of infection. Solu cortef likely contributing to this. Will start taper as vitals stable.  13. Parkinson's disease: On Sinemet 50/200 tid and artane bid  14. Acute respiratory failure: Resolved. Interstitial edema improving with improvement in symptoms. Lasix on hold due to AKI.  Encourage IS.  15.  Insomnia:  Uses klonopin at nights.  16. Thrombocytopenia:  Slowly resolving from 24---> 76. Monitor for signs of bleeding. Will check follow up labs in am.   Post Admission Physician Evaluation: 1. Functional deficits  secondary  to debility and encephalopathy after sepsis. 2. Patient is admitted to receive collaborative, interdisciplinary care between the physiatrist, rehab nursing staff, and therapy team. 3. Patient's level of medical complexity and substantial therapy needs in context of that medical necessity cannot be provided at a lesser intensity of care such as a SNF. 4. Patient has experienced substantial functional loss from his/her baseline which was documented above under the "Functional History" and "Functional Status" headings.  Judging by the patient's diagnosis, physical exam, and functional history, the patient has potential for functional progress which will result in measurable gains while on inpatient rehab.  These gains will be of substantial and practical use upon discharge  in facilitating mobility and self-care at the household level. 5. Physiatrist will provide 24 hour management of medical needs as well as oversight of the therapy plan/treatment and provide guidance as appropriate regarding the interaction of the two. 6. 24 hour rehab nursing   will assist with bladder management, bowel management, safety, skin/wound care, disease management, medication administration, pain management and patient education  and help integrate therapy concepts, techniques,education, etc. 7. PT will assess and treat for/with: Lower extremity strength, range of motion, stamina, balance, functional mobility, safety, adaptive techniques and equipment, NMR, pain mgt, skin mgt, family education, community reintegration.   Goals are: mod I to supervision. 8. OT will assess and treat for/with: ADL's, functional mobility, safety, upper extremity strength, adaptive techniques and equipment, NMR, pain mgt, skin care, ego support, family ed.   Goals are: supervision to mod I. Therapy may proceed with showering this patient. 9. SLP will assess and treat for/with: cognition, communication.  Goals are: mod I to  supervision. 10. Case Management and Social Worker will assess and treat for psychological issues and discharge planning. 11. Team conference will be held weekly to assess progress toward goals and to determine barriers to discharge. 12. Patient will receive at least 3 hours of therapy per day at least 5 days per week. 13. ELOS: 8-12 days       14. Prognosis:  excellent     [name], MD, [place] [facility] 01/16/2015   01/16/2015

## 2015-01-16 NOTE — Progress Notes (Signed)
Patient ID: Becky Rogers, female   DOB: August 17, 1950, 64 y.o.   MRN: [id number]     Referring Physician(s): CCM  Subjective:  Ms Becky Rogers looks much better today.  She is sitting up in the chair eating breakfast.  She seems a little more oriented today.  No complaints.  Allergies: Review of patient's allergies indicates no known allergies.  Medications: Prior to Admission medications   Medication Sig Start Date End Date Taking? Authorizing Provider  amLODipine (NORVASC) 5 MG tablet Take 5 mg by mouth daily.   Yes Historical Provider, MD  aspirin 81 MG tablet Take 81 mg by mouth daily.   Yes Historical Provider, MD  atorvastatin (LIPITOR) 40 MG tablet Take 40 mg by mouth daily.   Yes Historical Provider, MD  CALCIUM-VITAMIN D PO Take 2 tablets by mouth daily.   Yes Historical Provider, MD  carbidopa-levodopa (SINEMET IR) 25-100 MG per tablet Take 2 tablets by mouth 3 (three) times daily.   Yes Historical Provider, MD  clonazePAM (KLONOPIN) 0.5 MG tablet Take 0.5 mg by mouth at bedtime.   Yes Historical Provider, MD  fish oil-omega-3 fatty acids 1000 MG capsule Take 3 g by mouth daily.   Yes Historical Provider, MD  Multiple Vitamins-Minerals (MULTIVITAMINS THER. W/MINERALS) TABS Take 1 tablet by mouth daily.   Yes Historical Provider, MD  TRIHEXYPHENIDYL HCL PO Take 1 tablet by mouth 2 (two) times daily.   Yes Historical Provider, MD  HYDROcodone-acetaminophen (NORCO) 5-325 MG per tablet Take 1-2 tablets by mouth every 6 (six) hours as needed for moderate pain or severe pain. Patient not taking: Reported on 01/09/2015 08/28/14   Becky Skates, MD     Vital Signs: BP 131/66 mmHg  Pulse 53  Temp(Src) 97.7 F (36.5 C) (Oral)  Resp 14  Ht 5\' 2"  (1.575 m)  Wt 130 lb 8.2 oz (59.2 kg)  BMI 23.86 kg/m2  SpO2 97%  Physical Exam   Awake and Alert. Up in Chair Answers questions more appropriately today.  Left PCN intact.  Skin where tube enters looks ok. Ecchymosis stable. No  erythema Output 1755 mls clear yellow  Imaging: Ct Head Wo Contrast  01/12/2015   CLINICAL DATA:  Altered mental status, sepsis.  EXAM: CT HEAD WITHOUT CONTRAST  TECHNIQUE: Contiguous axial images were obtained from the base of the skull through the vertex without intravenous contrast.  COMPARISON:  None.  FINDINGS: No acute intracranial abnormality. Specifically, no hemorrhage, hydrocephalus, mass lesion, acute infarction, or significant intracranial injury. No acute calvarial abnormality. Visualized paranasal sinuses and mastoids clear. Orbital soft tissues unremarkable.  IMPRESSION: No acute intracranial abnormality.   Electronically Signed   By: Rolm Baptise M.D.   On: 01/12/2015 13:09   Dg Chest Port 1 View  01/15/2015   CLINICAL DATA:  Shortness of Breath  EXAM: PORTABLE CHEST - 1 VIEW  COMPARISON:  January 13, 2015  FINDINGS: There has been considerable clearing of interstitial edema compared to 2 days prior. There remains left lower lobe consolidation with left effusion. There is a smaller right effusion. There is atelectatic change in the right mid lung. Heart is upper normal in size with pulmonary vascularity within normal limits. No adenopathy.  IMPRESSION: Considerable clearing of interstitial edema compared to recent prior study. Persistent left lower lobe consolidation with small left effusion. A smaller right effusion is also present. Right midlung atelectatic change present.   Electronically Signed   By: Lowella Grip III M.D.   On: 01/15/2015 07:14  Dg Chest Port 1 View  01/13/2015   CLINICAL DATA:  Pulmonary infiltrate  EXAM: PORTABLE CHEST - 1 VIEW  COMPARISON:  01/11/2015  FINDINGS: Stable positioning of left IJ central line, tip at the upper cavoatrial junction.  Stable borderline cardiomegaly.  Negative aortic and hilar contours.  Increased hazy density over the lower chest compatible with pleural fluid. The change could reflect increase or differential layering. Diffuse interstitial  opacities compatible with edema and confirmed on abdominal CT 01/09/2015.  IMPRESSION: Pulmonary edema with bilateral pleural effusion. Left pleural fluid could have increased or layered.   Electronically Signed   By: Monte Fantasia M.D.   On: 01/13/2015 06:12   Dg Abd Portable 1v  01/14/2015   CLINICAL DATA:  Pyelonephritis.  Nephrostomy.  EXAM: PORTABLE ABDOMEN - 1 VIEW  COMPARISON:  None.  FINDINGS: A left nephrostomy tube is noted with a single well in the end of the tube in the expected vicinity of the left collecting system.  There is a potential calcification projecting over the more medial portion of the coil, measuring up to 6 mm in long axis.  Bowel gas pattern unremarkable.  IMPRESSION: 1. Left nephrostomy tube with single cul well in the expected position of the left collecting system. A calculus projects over the medial margin of the loop.   Electronically Signed   By: Van Clines M.D.   On: 01/14/2015 19:29    Labs:  CBC:  Recent Labs  01/13/15 0400 01/14/15 0245 01/15/15 0317 01/16/15 0216  WBC 42.8* 55.3* 55.6* 43.8*  HGB 13.8 13.2 12.3 11.9*  HCT 38.8 37.8 35.5* 35.0*  PLT 24* 29* 53* 76*    COAGS:  Recent Labs  01/10/15 0050 01/12/15 1146  INR 2.22* 1.36    BMP:  Recent Labs  01/14/15 0245 01/15/15 0317 01/15/15 1656 01/16/15 0216  NA 140 143 142 143  K 3.7 2.8* 3.0* 3.3*  CL 104 107 107 110  CO2 24 25 23 24   GLUCOSE 112* 119* 123* 103*  BUN 91* 94* 86* 78*  CALCIUM 7.9* 7.8* 8.0* 7.6*  CREATININE 3.43* 2.80* 2.38* 2.03*  GFRNONAA 13* 17* 20* 25*  GFRAA 15* 19* 24* 29*    LIVER FUNCTION TESTS:  Recent Labs  01/11/15 0648 01/13/15 0400 01/15/15 1656 01/16/15 0216  BILITOT 1.5* 1.2 1.0 0.8  AST 54* 38 58* 38  ALT 13* 8* 21 8*  ALKPHOS 106 231* 247* 206*  PROT 4.1* 4.8* 5.0* 4.6*  ALBUMIN 1.9* 2.0* 2.4* 2.1*    Assessment and Plan:  S/P PCN on 7/1  WBC down to 43.8.   Continue current care  Signed: Murrell Redden  PA-C 01/16/2015, 9:36 AM   I spent a total of 15 Minutes in face to face in clinical consultation/evaluation, greater than 50% of which was counseling/coordinating care for PCN on 7/1

## 2015-01-16 NOTE — Progress Notes (Signed)
Subjective:  1- Left Ureteral Stone - s/p left neph tube 01/10/15 for urgent decompression from 7.20mm left prox ureteral stone with mild-mod hydro and ipsilateral approx 60mm lower pole stone by ER CT 01/09/15 on eval left abdominal pain and septic shock. No prior nephrolithiasis. No contralateral stones.  2 - Urosepsis - fevers, tachycardia, hypotension on ER presentation c/w sepsis. UCX, [place]  with pan-sensitive e. Coli, now on high dose rocephin with resolution of fevers.   3 - Acute Renal Failure - Cr 2.9 on ER presentation up from baseline <1. Admits to some poor PO intake and now recent hypotention, left hydro. No right hydro or obvious renal atrophy.  Cr peak high 3's this admission. R/u [place] after neph tube placement with resolved left hydro and confirms no right sided. UOP has been approx symmetric per urethra v. neph tube.   Today Becky Rogers is continuing to improve. In process of arranging post-hospital rehab admission. No interval fevers. GFR continues to improve.   Objective: Vital signs in last 24 hours: Temp:  [97.3 F (36.3 C)-98.3 F (36.8 C)] 97.3 F (36.3 C) (07/07 1256) Pulse Rate:  [52-69] 69 (07/07 1256) Resp:  [12-17] 12 (07/07 1256) BP: (111-131)/(46-87) 118/65 mmHg (07/07 1256) SpO2:  [93 %-99 %] 99 % (07/07 1256) Weight:  [59.2 kg (130 lb 8.2 oz)] 59.2 kg (130 lb 8.2 oz) (07/07 0500) Last BM Date: 01/16/15  Intake/Output from previous day: 07/06 0701 - 07/07 0700 In: 625 [I.V.:625] Out: 1755 [Urine:1755] Intake/Output this shift: Total I/O In: 600 [P.O.:600] Out: 375 [Urine:375]  General appearance: alert, cooperative, appears stated age and family at bedside Head: Normocephalic, without obvious abnormality, atraumatic Neck: supple, symmetrical, trachea midline and prior neck line site w/o erythema / hematoma Back: symmetric, no curvature. ROM normal. No CVA tenderness., left neph tueb c/d/i with clear yellow urine.  Resp: non-labored on room air Cardio: Nl  rate GI: soft, non-tender; bowel sounds normal; no masses,  no organomegaly Extremities: improved upper extremity ischemic changes, now minimal resudal blanchign erythema. Bilat LE ischemic changes wtih some darkning of tips of 2-3 toes bilaterally concerning for early gangrene.  Pulses: 2+ and symmetric Lymph nodes: Cervical, supraclavicular, and axillary nodes normal. Neurologic: Grossly normal  Lab Results:   Recent Labs  01/15/15 0317 01/16/15 0216  WBC 55.6* 43.8*  HGB 12.3 11.9*  HCT 35.5* 35.0*  PLT 53* 76*   BMET  Recent Labs  01/15/15 1656 01/16/15 0216  NA 142 143  K 3.0* 3.3*  CL 107 110  CO2 23 24  GLUCOSE 123* 103*  BUN 86* 78*  CREATININE 2.38* 2.03*  CALCIUM 8.0* 7.6*   PT/INR No results for input(s): LABPROT, INR in the last 72 hours. ABG No results for input(s): PHART, HCO3 in the last 72 hours.  Invalid input(s): PCO2, PO2  Studies/Results: Dg Chest Port 1 View  01/15/2015   CLINICAL DATA:  Shortness of Breath  EXAM: PORTABLE CHEST - 1 VIEW  COMPARISON:  January 13, 2015  FINDINGS: There has been considerable clearing of interstitial edema compared to 2 days prior. There remains left lower lobe consolidation with left effusion. There is a smaller right effusion. There is atelectatic change in the right mid lung. Heart is upper normal in size with pulmonary vascularity within normal limits. No adenopathy.  IMPRESSION: Considerable clearing of interstitial edema compared to recent prior study. Persistent left lower lobe consolidation with small left effusion. A smaller right effusion is also present. Right midlung atelectatic change present.  Electronically Signed   By: Lowella Grip III M.D.   On: 01/15/2015 07:14   Dg Abd Portable 1v  01/14/2015   CLINICAL DATA:  Pyelonephritis.  Nephrostomy.  EXAM: PORTABLE ABDOMEN - 1 VIEW  COMPARISON:  None.  FINDINGS: A left nephrostomy tube is noted with a single well in the end of the tube in the expected vicinity  of the left collecting system.  There is a potential calcification projecting over the more medial portion of the coil, measuring up to 6 mm in long axis.  Bowel gas pattern unremarkable.  IMPRESSION: 1. Left nephrostomy tube with single cul well in the expected position of the left collecting system. A calculus projects over the medial margin of the loop.   Electronically Signed   By: Van Clines M.D.   On: 01/14/2015 19:29    Anti-infectives: Anti-infectives    Start     Dose/Rate Route Frequency Ordered Stop   01/12/15 2200  cefTRIAXone (ROCEPHIN) 2 g in dextrose 5 % 50 mL IVPB - Premix     2 g 100 mL/hr over 30 Minutes Intravenous Every 24 hours 01/12/15 1144     01/11/15 1400  imipenem-cilastatin (PRIMAXIN) 250 mg in sodium chloride 0.9 % 100 mL IVPB  Status:  Discontinued     250 mg 200 mL/hr over 30 Minutes Intravenous Every 12 hours 01/11/15 1333 01/12/15 1129   01/10/15 2130  vancomycin (VANCOCIN) 500 mg in sodium chloride 0.9 % 100 mL IVPB  Status:  Discontinued     500 mg 100 mL/hr over 60 Minutes Intravenous Every 24 hours 01/09/15 2129 01/09/15 2344   01/10/15 1430  cefTAZidime (FORTAZ) 2 g in dextrose 5 % 50 mL IVPB  Status:  Discontinued     2 g 100 mL/hr over 30 Minutes Intravenous Every 24 hours 01/10/15 1417 01/11/15 1321   01/10/15 0330  piperacillin-tazobactam (ZOSYN) IVPB 3.375 g  Status:  Discontinued     3.375 g 12.5 mL/hr over 240 Minutes Intravenous Every 8 hours 01/09/15 2134 01/09/15 2344   01/10/15 0030  ciprofloxacin (CIPRO) IVPB 400 mg  Status:  Discontinued     400 mg 200 mL/hr over 60 Minutes Intravenous Daily at bedtime 01/10/15 0005 01/11/15 1321   01/09/15 2130  piperacillin-tazobactam (ZOSYN) IVPB 3.375 g     3.375 g 100 mL/hr over 30 Minutes Intravenous  Once 01/09/15 2126 01/09/15 2202   01/09/15 2130  vancomycin (VANCOCIN) IVPB 1000 mg/200 mL premix  Status:  Discontinued     1,000 mg 200 mL/hr over 60 Minutes Intravenous  Once 01/09/15 2126  01/09/15 2129   01/09/15 2130  vancomycin (VANCOCIN) IVPB 750 mg/150 ml premix     750 mg 150 mL/hr over 60 Minutes Intravenous  Once 01/09/15 2129 01/09/15 2306      Assessment/Plan:  1- Left Ureteral Stone - now s/p decompression with neph tube, appreciate IR service as avoided likely prolonged intubation. Will need eventual ureteroscopy in elective setting, request made for outpatient GU follow up in few weeks to discuss and arrange.   2 - Urosepsis - agree with current and nephron-friendly ABX regimen now according to CX data.   3 - Acute Renal Failure - likely multifactorial with left renal obstruction but also recent profound hypoperfusion state, now continuing improvement in GFR.  4 - will follow prn at this point, please call with questions. Marland Kitchen   [facility], Regis Hinton 01/16/2015

## 2015-01-16 NOTE — Progress Notes (Signed)
Ok to send to Rehab without discharge summary per Dr. Sherral Hammers.

## 2015-01-16 NOTE — PMR Pre-admission (Signed)
PMR Admission Coordinator Pre-Admission Assessment  Patient: Becky Rogers is an 64 y.o., female MRN: [id number] DOB: Jan 02, 1951 Height: _0  (157.5 cm) Weight: 59.2 kg (130 lb 8.2 oz)              Insurance Information HMO:   PPO:      PCP:      IPA:      80/20:      OTHER: [employer] PRIMARY: BCBS      Policy#: [id number]      Subscriber: Garen Lah CM Name: Elspeth Cho      Phone#: [phone]     Fax#: [phone] Pre-Cert#: [id number] with update due 01/21/15      Employer: [employer] Benefits:  Phone #: [phone]     Name: On line Eff. Date: 06/11/14     Deduct: $1500 (met $121.35)      Out of Pocket Max: $3500 (met $ 121.35)       Life Max: unlimited CIR: $250 copay and the 80%      SNF: 80% with 60 days max Outpatient: 30 visits max     Co-Pay: $20 copay Home Health: 80%      Co-Pay: 20% DME: 80%     Co-Pay: 20% Providers: in Therapist, art Information    Name Relation Home Work Mobile   [place] Spouse [phone]  [phone]     Current Medical History  Patient Admitting Diagnosis: Debility and encephalopathy after sepsis, hx of parkinson's disease   History of Present Illness: A 64 y.o. female with history of Parkinson's disease with mild dementia, breast cancer, HTN who was admitted 01/09/15 with abdominal pain, acute renal failure and hypotension due to urosepsis. She was started on pressors, solucortef and fluids as well as broad spectrum antibiotics for septic shock. CT revealed left ureteral stone with concerns for obstructed pyonephrosis and underwent left percutaneous nephrostomy by interventional radiology. Dr. Alinda Money recommended 14 days of culture specific antibiotics prior to any definitive stone procedure and follow up with Dr. Tresa Moore on outpatient basis. Antibiotics to ceftriaxone for pansensitive E coli bacteremia. Mentation improving but WBC on upward trend. Swallow evaluation revealed  mild cognitively based dysphagia and esophageal/aspiration precautions recommended. Mobility limited by foot pain due to ischemic toes.  Fingers are also ischemic. CIR recommended by MD and Rehab team.      Past Medical History  Past Medical History  Diagnosis Date  . Hypertension   . Hypercholesteremia   . Parkinson disease   . Breast cancer     right    Family History  family history is not on file.  Prior Rehab/Hospitalizations:  Has the patient had major surgery during 100 days prior to admission? No  Current Medications   Current facility-administered medications:  .  carbidopa-levodopa (SINEMET IR) 25-100 MG per tablet immediate release 2 tablet, 2 tablet, Oral, TID, Marijean Heath, NP, 2 tablet at 01/16/15 1025 .  cefTRIAXone (ROCEPHIN) 2 g in dextrose 5 % 50 mL IVPB - Premix, 2 g, Intravenous, Q24H, Wilhelmina Mcardle, MD, 2 g at 01/15/15 2223 .  clonazePAM (KLONOPIN) tablet 0.5 mg, 0.5 mg, Oral, QHS, Cherene Altes, MD, 0.5 mg at 01/15/15 2223 .  fentaNYL (SUBLIMAZE) injection 25-50 mcg, 25-50 mcg, Intravenous, Q1H PRN, Rahul P Desai, PA-C .  hydrocortisone sodium succinate (SOLU-CORTEF) 100 MG injection 50 mg, 50 mg, Intravenous, Q12H, Cherene Altes, MD, 50 mg at 01/16/15 0123 .  ondansetron (ZOFRAN)  injection 4 mg, 4 mg, Intravenous, Q6H PRN, Anders Simmonds, MD, 4 mg at 01/10/15 0314 .  trihexyphenidyl (ARTANE) tablet 2 mg, 2 mg, Oral, BID WC, Marijean Heath, NP, 2 mg at 01/16/15 1025  Patients Current Diet: Diet regular Room service appropriate?: Yes; Fluid consistency:: Thin  Precautions / Restrictions Precautions Precautions: Fall Restrictions Weight Bearing Restrictions: No   Has the patient had 2 or more falls or a fall with injury in the past year?No  Prior Activity Level    Home Assistive Devices / Equipment Home Assistive Devices/Equipment: None Home Equipment: None  Prior Device Use: Indicate devices/aids used by the patient prior  to current illness, exacerbation or injury? None of the above.  Was very independent.  Prior Functional Level Prior Function Level of Independence: Independent  Self Care: Did the patient need help bathing, dressing, using the toilet or eating?  Independent  Indoor Mobility: Did the patient need assistance with walking from room to room (with or without device)? Independent  Stairs: Did the patient need assistance with internal or external stairs (with or without device)? Independent  Functional Cognition: Did the patient need help planning regular tasks such as shopping or remembering to take medications? Independent  Current Functional Level Cognition  Overall Cognitive Status: Impaired/Different from baseline Current Attention Level: Selective Orientation Level: Oriented to person, Disoriented to place, Disoriented to time, Disoriented to situation Following Commands: Follows one step commands consistently Safety/Judgement: Decreased awareness of safety, Decreased awareness of deficits General Comments: Pt used newspaper to locate date.    Extremity Assessment (includes Sensation/Coordination)  Upper Extremity Assessment: Generalized weakness (discoloration of fingers from septic shock)  Lower Extremity Assessment: Defer to PT evaluation RLE Deficits / Details: patient with discoloration on toes1st2nd 4th and 5th LLE Deficits / Details: patient with discoloration on toes(all toes, with bumpbs noted on great toe)    ADLs  Overall ADL's : Needs assistance/impaired Eating/Feeding: Sitting, Supervision/ safety Grooming: Wash/dry hands, Wash/dry face, Sitting, Set up Upper Body Bathing: Minimal assitance, Sitting Lower Body Bathing: Minimal assistance, Sit to/from stand Upper Body Dressing : Minimal assistance, Sitting Lower Body Dressing: Minimal assistance, Sit to/from stand Toilet Transfer: Minimal assistance, Ambulation, BSC Toileting- Clothing Manipulation and Hygiene:  Minimal assistance, Sit to/from stand Functional mobility during ADLs: Minimal assistance General ADL Comments: able to donn and doff socks    Mobility  Overal bed mobility: Needs Assistance Bed Mobility: Supine to Sit Supine to sit: Supervision General bed mobility comments: supervision for safety as pt impulsive despite cues    Transfers  Overall transfer level: Needs assistance Equipment used: 1 person hand held assist Transfers: Sit to/from Stand Sit to Stand: Min guard Stand pivot transfers: Mod assist, +2 physical assistance General transfer comment: impulsive with sit to stand, cues not to pull on RW    Ambulation / Gait / Stairs / Wheelchair Mobility  Ambulation/Gait Ambulation/Gait assistance: Museum/gallery curator (Feet): 300 Feet Assistive device: Rolling walker (2 wheeled), None General Gait Details: pt used RW without LOB with cues for direction and position in RW. Walked half distance without RW with 3 LOb with turning with tactile cues to recover Gait Pattern/deviations: Step-through pattern, Decreased stride length Gait velocity interpretation: Below normal speed for age/gender    Posture / Balance Balance Overall balance assessment: Needs assistance Sitting-balance support: Feet supported Sitting balance-Leahy Scale: Good Standing balance support: During functional activity Standing balance-Leahy Scale: Fair Standardized Balance Assessment Standardized Balance Assessment : Oceanographer Test [facility] Balance Test Sit  to Stand: Able to stand without using hands and stabilize independently Standing Unsupported: Able to stand safely 2 minutes Sitting with Back Unsupported but Feet Supported on Floor or Stool: Able to sit safely and securely 2 minutes Stand to Sit: Sits safely with minimal use of hands Transfers: Able to transfer safely, minor use of hands Standing Unsupported with Eyes Closed: Able to stand 10 seconds safely Standing Ubsupported with Feet  Together: Able to place feet together independently and stand 1 minute safely From Standing, Reach Forward with Outstretched Arm: Can reach forward >12 cm safely (5") From Standing Position, Pick up Object from Floor: Able to pick up shoe safely and easily From Standing Position, Turn to Look Behind Over each Shoulder: Looks behind one side only/other side shows less weight shift Turn 360 Degrees: Able to turn 360 degrees safely but slowly Standing Unsupported, Alternately Place Feet on Step/Stool: Needs assistance to keep from falling or unable to try Standing Unsupported, One Foot in Front: Able to plae foot ahead of the other independently and hold 30 seconds Standing on One Leg: Tries to lift leg/unable to hold 3 seconds but remains standing independently Total Score: 44    Special needs/care consideration BiPAP/CPAP No CPM No Continuous Drip IV No  Dialysis No       Life Vest No Oxygen No Special Bed No Trach Size No Wound Vac (area) No      Skin Bruising on arms.  Ischemic, discolored fingers and toes.                            Bowel mgmt: Last BM 01/16/15 Bladder mgmt: Voiding WDL Diabetic mgmt No    Previous Home Environment Living Arrangements: Spouse/significant other Available Help at Discharge: Family, Available PRN/intermittently Type of Home: House Bathroom Shower/Tub: Walk-in Radio producer: Standard Home Care Services: No Additional Comments: Pt's husband works full-time. He performs housekeeping. Pt drives, performs light meal prep and self care independently.  Daughter in room and stated pt drove to her home in [place] and cared for her after and illness just 3 weeks ago.  Discharge Living Setting Plans for Discharge Living Setting: Patient's home, House, Lives with (comment) (Lives with husband.) Type of Home at Discharge: House Discharge Home Layout: Two level, Able to live on main level with bedroom/bathroom Alternate Level Stairs-Number of Steps:  Flight Discharge Home Access: Stairs to enter [employer] of Steps: 8-10 steps from garage Does the patient have any problems obtaining your medications?: No  Social/Family/Support Systems Patient Roles: Spouse, Parent (Has a husband and 2 daughters.)  One daughter lives in [place] and the other lives in [place]. Contact Information: Jahlia Omura - spouse [phone] (c) [phone] Anticipated Caregiver: husband, family and neighbors Ability/Limitations of Caregiver: Husband worksdays. Caregiver Availability: Other (Comment) (Husband will work on a rotational schedule for care givers.) Discharge Plan Discussed with Primary Caregiver: Yes Is Caregiver In Agreement with Plan?: Yes Does Caregiver/Family have Issues with Lodging/Transportation while Pt is in Rehab?: No  Goals/Additional Needs Patient/Family Goal for Rehab: PT/OT/ST supervision goals Expected length of stay: 11-15 days Cultural Considerations: None Dietary Needs: Regular diet, thin liquids Equipment Needs: TBD Pt/Family Agrees to Admission and willing to participate: Yes Program Orientation Provided & Reviewed with Pt/Caregiver Including Roles  & Responsibilities: Yes  Decrease burden of Care through IP rehab admission: N/A  Possible need for SNF placement upon discharge: Not planned  Patient Condition: This patient's medical and functional status  has changed since the consult dated: 01/14/15 in which the Rehabilitation Physician determined and documented that the patient's condition is appropriate for intensive rehabilitative care in an inpatient rehabilitation facility. See "History of Present Illness" (above) for medical update. Functional changes are: Currently requiring min assist to ambulate 300 ft RW. Patient's medical and functional status update has been discussed with the Rehabilitation physician and patient remains appropriate for inpatient rehabilitation. Will admit to inpatient rehab  today.  Preadmission Screen Completed By:  Retta Diones, 01/16/2015 1:20 PM ______________________________________________________________________   Discussed status with Dr. Naaman Plummer on 01/16/15 at 1322 and received telephone approval for admission today.  Admission Coordinator:  Retta Diones, time1322/Date07/07/16

## 2015-01-16 NOTE — Progress Notes (Signed)
Per husband he wanted his wife's feet washed. Noted toes and upper part of feet to be red and area on back of lt great and 1st toe noted to be blck and 1st toe to rt. Foot noted to be black. Strong pedal pulses noted bil.

## 2015-01-16 NOTE — H&P (Signed)
Physical Medicine and Rehabilitation Admission H&P    Chief Complaint  Patient presents with  . Deconditioning due to multiple medical issues   HPI:   Becky Rogers is a 64 y.o. female with history of Parkinson's disease with mild dementia, breast cancer, HTN who was admitted 01/09/15 with abdominal pain, acute renal failure and hypotension due to urosepsis. She was started on pressors, solucortef and fluids as well as broad spectrum antibiotics for septic shock. CT revealed left ureteral stone with concerns for obstructed pyonephrosis and underwent left percutaneous nephrostomy by interventional radiology. Dr. Alinda Money recommended 14 days of culture specific antibiotics prior to any definitive stone procedure and follow up with Dr. Tresa Moore on outpatient basis. Antibiotics changed to ceftriaxone for pansensitive E coli bacteremia. Mentation is improving. Acute renal failure slowly improving and thrombocytopenia/coagulopathy resolving. Ischemic injury to bilateral finger and toes/feet being monitored for demarcation/gangrenous changes. Swallow evaluation revealed mild cognitively based dysphagia and was placed on esophageal/aspiration precautions. Therapy ongoing and CIR recommended by MD and Rehab team.    Review of Systems  HENT: Negative for hearing loss.   Eyes: Negative for blurred vision and double vision.  Respiratory: Negative for cough and shortness of breath.   Cardiovascular: Negative for chest pain and palpitations.  Gastrointestinal: Negative for heartburn and nausea.  Genitourinary: Negative for dysuria, urgency and frequency.  Musculoskeletal: Positive for myalgias. Negative for back pain.  Neurological: Negative for dizziness, tremors and headaches.  Psychiatric/Behavioral: Positive for memory loss. The patient has insomnia (has outbursts during sleep. ).    Past Medical History  Diagnosis Date  . Hypertension   . Hypercholesteremia   . Parkinson disease   . Breast  cancer     right   Past Surgical History  Procedure Laterality Date  . Cesarean section      x2  . Ovarian cyst removal      age 15  . Appendectomy    . Colonoscopy    . Dental surgery      implant  . Breast lumpectomy with radioactive seed localization Right 08/28/2014    Procedure: RIGHT BREAST LUMPECTOMY WITH RADIOACTIVE SEED LOCALIZATION;  Surgeon: [name], MD;  Location: [facility];  Service: General;  Laterality: Right;     History reviewed. No pertinent family history.    Social History:  Married. Independent PTA--husband does home management/cooking.   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 She reports that she has never smoked. She does not have any smokeless tobacco history on file. She reports that she has a martini on Sundays.  She reports that she does not use illicit drugs.    Allergies: No Known Allergies    Medications Prior to Admission  Medication Sig Dispense Refill  . amLODipine (NORVASC) 5 MG tablet Take 5 mg by mouth daily.    Marland Kitchen aspirin 81 MG tablet Take 81 mg by mouth daily.    Marland Kitchen atorvastatin (LIPITOR) 40 MG tablet Take 40 mg by mouth daily.    Marland Kitchen CALCIUM-VITAMIN D PO Take 2 tablets by mouth daily.    . carbidopa-levodopa (SINEMET IR) 25-100 MG per tablet Take 2 tablets by mouth 3 (three) times daily.    . clonazePAM (KLONOPIN) 0.5 MG tablet Take 0.5 mg by mouth at bedtime.    . fish oil-omega-3 fatty acids 1000 MG capsule Take 3 g by mouth daily.    . Multiple Vitamins-Minerals (MULTIVITAMINS THER. W/MINERALS) TABS Take  1 tablet by mouth daily.    . TRIHEXYPHENIDYL HCL PO Take 1 tablet by mouth 2 (two) times daily.    Marland Kitchen HYDROcodone-acetaminophen (NORCO) 5-325 MG per tablet Take 1-2 tablets by mouth every 6 (six) hours as needed for moderate pain or severe pain. (Patient not taking: Reported on 01/09/2015) 30 tablet 0    Home: Home Living Family/patient expects to be discharged to:: Private  residence Living Arrangements: Spouse/significant other Available Help at Discharge: Family, Available PRN/intermittently Type of Home: House Home Equipment: None Additional Comments: Pt's husband works full-time. He performs housekeeping. Pt drives, performs light meal prep and self care independently.  Daughter in room and stated pt drove to her home in [place] and cared for her after and illness just 3 weeks ago.   Functional History: Prior Function Level of Independence: Independent  Functional Status:  Mobility: Bed Mobility Overal bed mobility: Needs Assistance Bed Mobility: Supine to Sit Supine to sit: Supervision General bed mobility comments: supervision for safety as pt impulsive despite cues Transfers Overall transfer level: Needs assistance Equipment used: 1 person hand held assist Transfers: Sit to/from Stand Sit to Stand: Min guard Stand pivot transfers: Mod assist, +2 physical assistance General transfer comment: impulsive with sit to stand, cues not to pull on RW Ambulation/Gait Ambulation/Gait assistance: Min assist Ambulation Distance (Feet): 300 Feet Assistive device: Rolling walker (2 wheeled), None General Gait Details: pt used RW without LOB with cues for direction and position in RW. Walked half distance without RW with 3 LOb with turning with tactile cues to recover Gait Pattern/deviations: Step-through pattern, Decreased stride length Gait velocity interpretation: Below normal speed for age/gender    ADL: ADL Overall ADL's : Needs assistance/impaired Eating/Feeding: Sitting, Supervision/ safety Grooming: Wash/dry hands, Wash/dry face, Sitting, Set up Upper Body Bathing: Minimal assitance, Sitting Lower Body Bathing: Minimal assistance, Sit to/from stand Upper Body Dressing : Minimal assistance, Sitting Lower Body Dressing: Minimal assistance, Sit to/from stand Toilet Transfer: Minimal assistance, Ambulation, BSC Toileting- Clothing Manipulation  and Hygiene: Minimal assistance, Sit to/from stand Functional mobility during ADLs: Minimal assistance General ADL Comments: able to donn and doff socks  Cognition: Cognition Overall Cognitive Status: Impaired/Different from baseline Orientation Level: Oriented to person, Disoriented to place, Disoriented to time, Disoriented to situation Cognition Arousal/Alertness: Awake/alert Behavior During Therapy: Impulsive Overall Cognitive Status: Impaired/Different from baseline Area of Impairment: Orientation, Memory, Safety/judgement, Awareness, Problem solving Orientation Level: Disoriented to, Time Current Attention Level: Selective Memory: Decreased short-term memory Following Commands: Follows one step commands consistently Safety/Judgement: Decreased awareness of safety, Decreased awareness of deficits Awareness: Intellectual Problem Solving: Decreased initiation, Difficulty sequencing, Requires verbal cues General Comments: Pt used newspaper to locate date.  Physical Exam: Blood pressure 118/65, pulse 69, temperature 97.3 F (36.3 C), temperature source Oral, resp. rate 12, height _0  (1.575 m), weight 59.2 kg (130 lb 8.2 oz), SpO2 99 %. Physical Exam  Nursing note and vitals reviewed. Constitutional: She is oriented to person, place, and time. She appears well-developed and well-nourished.  HENT:  Head: Normocephalic and atraumatic.  Right Ear: External ear normal.  Left Ear: External ear normal.  Mouth/Throat: Oropharynx is clear and moist.  Eyes: Conjunctivae are normal. Pupils are equal, round, and reactive to light.  Neck: Normal range of motion. Neck supple. No JVD present. No tracheal deviation present. No thyromegaly present.  Cardiovascular: Normal rate and regular rhythm.  Exam reveals no friction rub.   No murmur heard. Respiratory: Effort normal. No respiratory distress. She has decreased breath  sounds in the right lower field and the left lower field. She has no  wheezes. She exhibits no tenderness.  GI: Soft. Bowel sounds are normal. She exhibits no distension. There is no tenderness. There is no rebound.  Genitourinary:  Left nephrostomy draining pale yellow urine.   Musculoskeletal: She exhibits edema.  Decrease in edema LUE.  Right antecubital area with sclerotic vein with mild erythema--non tender to touch.   Neurological: She is alert and oriented to person, place, and time. No cranial nerve deficit. Coordination normal.  Oriented to situation with minimal cues. Needs redirection occasionally. Able to follow basic one step commands without difficulty. UE's 3+ prox to 4/5 distally. LE's 2+hf, 3/5 ke and 3+ ankles. Senses pain and LT in both legs. No rigidity or tremor. Masked facies. Delayed processing. No sensory deficits  Skin: Skin is warm and dry.  Ischemic areas distal fingers and toes. Toes more affected. Right 4th and 5th toes with some breakdown in webspace. No gangrene..  Non-tender to touch.    Psychiatric: She has a normal mood and affect. Her behavior is normal.    Results for orders placed or performed during the hospital encounter of 01/09/15 (from the past 48 hour(s))  Procalcitonin - Baseline     Status: None   Collection Time: 01/14/15  2:45 PM  Result Value Ref Range   Procalcitonin 11.19 ng/mL    Comment:        Interpretation: PCT >= 10 ng/mL: Important systemic inflammatory response, almost exclusively due to severe bacterial sepsis or septic shock. (NOTE)         ICU PCT Algorithm               Non ICU PCT Algorithm    ----------------------------     ------------------------------         PCT < 0.25 ng/mL                 PCT < 0.1 ng/mL     Stopping of antibiotics            Stopping of antibiotics       strongly encouraged.               strongly encouraged.    ----------------------------     ------------------------------       PCT level decrease by               PCT < 0.25 ng/mL       >= 80% from peak PCT        OR PCT 0.25 - 0.5 ng/mL          Stopping of antibiotics                                             encouraged.     Stopping of antibiotics           encouraged.    ----------------------------     ------------------------------       PCT level decrease by              PCT >= 0.25 ng/mL       < 80% from peak PCT        AND PCT >= 0.5 ng/mL             Continuing antibiotics  encouraged.       Continuing antibiotics            encouraged.    ----------------------------     ------------------------------     PCT level increase compared          PCT > 0.5 ng/mL         with peak PCT AND          PCT >= 0.5 ng/mL             Escalation of antibiotics                                          strongly encouraged.      Escalation of antibiotics        strongly encouraged.   Lactic acid, plasma     Status: Abnormal   Collection Time: 01/14/15  2:45 PM  Result Value Ref Range   Lactic Acid, Venous 2.6 (HH) 0.5 - 2.0 mmol/L    Comment: REPEATED TO VERIFY CRITICAL RESULT CALLED TO, READ BACK BY AND VERIFIED WITH: E MOORE,RN 1543 01/14/15 D BRADLEY   Glucose, capillary     Status: Abnormal   Collection Time: 01/14/15  8:16 PM  Result Value Ref Range   Glucose-Capillary 115 (H) 65 - 99 mg/dL  Basic metabolic panel     Status: Abnormal   Collection Time: 01/15/15  3:17 AM  Result Value Ref Range   Sodium 143 135 - 145 mmol/L   Potassium 2.8 (L) 3.5 - 5.1 mmol/L    Comment: DELTA CHECK NOTED   Chloride 107 101 - 111 mmol/L   CO2 25 22 - 32 mmol/L   Glucose, Bld 119 (H) 65 - 99 mg/dL   BUN 94 (H) 6 - 20 mg/dL   Creatinine, Ser 2.80 (H) 0.44 - 1.00 mg/dL   Calcium 7.8 (L) 8.9 - 10.3 mg/dL   GFR calc non Af Amer 17 (L) >60 mL/min   GFR calc Af Amer 19 (L) >60 mL/min    Comment: (NOTE) The eGFR has been calculated using the CKD EPI equation. This calculation has not been validated in all clinical situations. eGFR's persistently <60 mL/min  signify possible Chronic Kidney Disease.    Anion gap 11 5 - 15  CBC     Status: Abnormal   Collection Time: 01/15/15  3:17 AM  Result Value Ref Range   WBC 55.6 (HH) 4.0 - 10.5 K/uL    Comment: CRITICAL VALUE NOTED.  VALUE IS CONSISTENT WITH PREVIOUSLY REPORTED AND CALLED VALUE.   RBC 4.15 3.87 - 5.11 MIL/uL   Hemoglobin 12.3 12.0 - 15.0 g/dL   HCT 35.5 (L) 36.0 - 46.0 %   MCV 85.5 78.0 - 100.0 fL   MCH 29.6 26.0 - 34.0 pg   MCHC 34.6 30.0 - 36.0 g/dL   RDW 14.4 11.5 - 15.5 %   Platelets 53 (L) 150 - 400 K/uL    Comment: PLATELET COUNT CONFIRMED BY SMEAR REPEATED TO VERIFY   Magnesium     Status: None   Collection Time: 01/15/15  3:17 AM  Result Value Ref Range   Magnesium 2.0 1.7 - 2.4 mg/dL  Phosphorus     Status: None   Collection Time: 01/15/15  3:17 AM  Result Value Ref Range   Phosphorus 3.7 2.5 - 4.6 mg/dL  Procalcitonin     Status: None  Collection Time: 01/15/15  3:17 AM  Result Value Ref Range   Procalcitonin 7.68 ng/mL    Comment:        Interpretation: PCT > 2 ng/mL: Systemic infection (sepsis) is likely, unless other causes are known. (NOTE)         ICU PCT Algorithm               Non ICU PCT Algorithm    ----------------------------     ------------------------------         PCT < 0.25 ng/mL                 PCT < 0.1 ng/mL     Stopping of antibiotics            Stopping of antibiotics       strongly encouraged.               strongly encouraged.    ----------------------------     ------------------------------       PCT level decrease by               PCT < 0.25 ng/mL       >= 80% from peak PCT       OR PCT 0.25 - 0.5 ng/mL          Stopping of antibiotics                                             encouraged.     Stopping of antibiotics           encouraged.    ----------------------------     ------------------------------       PCT level decrease by              PCT >= 0.25 ng/mL       < 80% from peak PCT        AND PCT >= 0.5 ng/mL             Continuing antibiotics                                               encouraged.       Continuing antibiotics            encouraged.    ----------------------------     ------------------------------     PCT level increase compared          PCT > 0.5 ng/mL         with peak PCT AND          PCT >= 0.5 ng/mL             Escalation of antibiotics                                          strongly encouraged.      Escalation of antibiotics        strongly encouraged.   Lactic acid, plasma     Status: Abnormal   Collection Time: 01/15/15 12:36 PM  Result Value Ref Range   Lactic Acid, Venous 2.4 (HH) 0.5 - 2.0 mmol/L  Comment: REPEATED TO VERIFY CRITICAL RESULT CALLED TO, READ BACK BY AND VERIFIED WITH: SMITH,F RN @ 2800 01/15/15 LEONARD,A   Comprehensive metabolic panel     Status: Abnormal   Collection Time: 01/15/15  4:56 PM  Result Value Ref Range   Sodium 142 135 - 145 mmol/L   Potassium 3.0 (L) 3.5 - 5.1 mmol/L   Chloride 107 101 - 111 mmol/L   CO2 23 22 - 32 mmol/L   Glucose, Bld 123 (H) 65 - 99 mg/dL   BUN 86 (H) 6 - 20 mg/dL   Creatinine, Ser 2.38 (H) 0.44 - 1.00 mg/dL   Calcium 8.0 (L) 8.9 - 10.3 mg/dL   Total Protein 5.0 (L) 6.5 - 8.1 g/dL   Albumin 2.4 (L) 3.5 - 5.0 g/dL   AST 58 (H) 15 - 41 U/L   ALT 21 14 - 54 U/L   Alkaline Phosphatase 247 (H) 38 - 126 U/L   Total Bilirubin 1.0 0.3 - 1.2 mg/dL   GFR calc non Af Amer 20 (L) >60 mL/min   GFR calc Af Amer 24 (L) >60 mL/min    Comment: (NOTE) The eGFR has been calculated using the CKD EPI equation. This calculation has not been validated in all clinical situations. eGFR's persistently <60 mL/min signify possible Chronic Kidney Disease.    Anion gap 12 5 - 15  CBC     Status: Abnormal   Collection Time: 01/16/15  2:16 AM  Result Value Ref Range   WBC 43.8 (H) 4.0 - 10.5 K/uL   RBC 4.06 3.87 - 5.11 MIL/uL   Hemoglobin 11.9 (L) 12.0 - 15.0 g/dL   HCT 35.0 (L) 36.0 - 46.0 %   MCV 86.2 78.0 - 100.0 fL   MCH  29.3 26.0 - 34.0 pg   MCHC 34.0 30.0 - 36.0 g/dL   RDW 14.4 11.5 - 15.5 %   Platelets 76 (L) 150 - 400 K/uL    Comment: CONSISTENT WITH PREVIOUS RESULT  Procalcitonin     Status: None   Collection Time: 01/16/15  2:16 AM  Result Value Ref Range   Procalcitonin 4.01 ng/mL    Comment:        Interpretation: PCT > 2 ng/mL: Systemic infection (sepsis) is likely, unless other causes are known. (NOTE)         ICU PCT Algorithm               Non ICU PCT Algorithm    ----------------------------     ------------------------------         PCT < 0.25 ng/mL                 PCT < 0.1 ng/mL     Stopping of antibiotics            Stopping of antibiotics       strongly encouraged.               strongly encouraged.    ----------------------------     ------------------------------       PCT level decrease by               PCT < 0.25 ng/mL       >= 80% from peak PCT       OR PCT 0.25 - 0.5 ng/mL          Stopping of antibiotics  encouraged.     Stopping of antibiotics           encouraged.    ----------------------------     ------------------------------       PCT level decrease by              PCT >= 0.25 ng/mL       < 80% from peak PCT        AND PCT >= 0.5 ng/mL            Continuing antibiotics                                               encouraged.       Continuing antibiotics            encouraged.    ----------------------------     ------------------------------     PCT level increase compared          PCT > 0.5 ng/mL         with peak PCT AND          PCT >= 0.5 ng/mL             Escalation of antibiotics                                          strongly encouraged.      Escalation of antibiotics        strongly encouraged.   Lactic acid, plasma     Status: None   Collection Time: 01/16/15  2:16 AM  Result Value Ref Range   Lactic Acid, Venous 1.7 0.5 - 2.0 mmol/L  Comprehensive metabolic panel     Status: Abnormal   Collection  Time: 01/16/15  2:16 AM  Result Value Ref Range   Sodium 143 135 - 145 mmol/L   Potassium 3.3 (L) 3.5 - 5.1 mmol/L   Chloride 110 101 - 111 mmol/L   CO2 24 22 - 32 mmol/L   Glucose, Bld 103 (H) 65 - 99 mg/dL   BUN 78 (H) 6 - 20 mg/dL   Creatinine, Ser 2.03 (H) 0.44 - 1.00 mg/dL   Calcium 7.6 (L) 8.9 - 10.3 mg/dL   Total Protein 4.6 (L) 6.5 - 8.1 g/dL   Albumin 2.1 (L) 3.5 - 5.0 g/dL   AST 38 15 - 41 U/L   ALT 8 (L) 14 - 54 U/L   Alkaline Phosphatase 206 (H) 38 - 126 U/L   Total Bilirubin 0.8 0.3 - 1.2 mg/dL   GFR calc non Af Amer 25 (L) >60 mL/min   GFR calc Af Amer 29 (L) >60 mL/min    Comment: (NOTE) The eGFR has been calculated using the CKD EPI equation. This calculation has not been validated in all clinical situations. eGFR's persistently <60 mL/min signify possible Chronic Kidney Disease.    Anion gap 9 5 - 15   Dg Chest Port 1 View  01/15/2015   CLINICAL DATA:  Shortness of Breath  EXAM: PORTABLE CHEST - 1 VIEW  COMPARISON:  January 13, 2015  FINDINGS: There has been considerable clearing of interstitial edema compared to 2 days prior. There remains left lower lobe consolidation with left effusion. There is a smaller right effusion. There is atelectatic change  in the right mid lung. Heart is upper normal in size with pulmonary vascularity within normal limits. No adenopathy.  IMPRESSION: Considerable clearing of interstitial edema compared to recent prior study. Persistent left lower lobe consolidation with small left effusion. A smaller right effusion is also present. Right midlung atelectatic change present.   Electronically Signed   By: Lowella Grip III M.D.   On: 01/15/2015 07:14   Dg Abd Portable 1v  01/14/2015   CLINICAL DATA:  Pyelonephritis.  Nephrostomy.  EXAM: PORTABLE ABDOMEN - 1 VIEW  COMPARISON:  None.  FINDINGS: A left nephrostomy tube is noted with a single well in the end of the tube in the expected vicinity of the left collecting system.  There is a potential  calcification projecting over the more medial portion of the coil, measuring up to 6 mm in long axis.  Bowel gas pattern unremarkable.  IMPRESSION: 1. Left nephrostomy tube with single cul well in the expected position of the left collecting system. A calculus projects over the medial margin of the loop.   Electronically Signed   By: Van Clines M.D.   On: 01/14/2015 19:29       Medical Problem List and Plan: 1. Functional deficits secondary to Encephalopathy and debility after sepsis 2.  DVT Prophylaxis/Anticoagulation: Mechanical: Sequential compression devices, below knee Bilateral lower extremities 3. Pain Management: Tylenol prn for pain 4. Mood: LCSW to follow with patient and husband for support. 5. Neuropsych: This patient is not capable of making decisions on her own behalf. 6. Skin/Wound Care: Routine pressure relief measures. Will add protein supplement for depleted protein stores with malnutrition.  7. Fluids/Electrolytes/Nutrition: Monitor I/O. Offer supplements between meals.  8. Left ureteral stone:  Left nephrostomy tube in place with good UOP. 9. AKI: Resolving post L-nephrostomy tube. Continue daily check to monitor for recovery. 10 Urosepsis due to E coli bacteremia/UTI: Continue antibiotics D # 7/ 14 days.  11. Hypokalemia: Will check in am. 12. Leucocytosis: WBC remains elevated. Procalcitonin trending downwards. continue to monitor for trends and signs of infection. Solu cortef likely contributing to this. Will start taper as vitals stable.  13. Parkinson's disease: On Sinemet 50/200 tid and artane bid  14. Acute respiratory failure: Resolved. Interstitial edema improving with improvement in symptoms. Lasix on hold due to AKI.  Encourage IS.  15.  Insomnia:  Uses klonopin at nights.  16. Thrombocytopenia:  Slowly resolving from 24---> 76. Monitor for signs of bleeding. Will check follow up labs in am.   Post Admission Physician Evaluation: 1. Functional deficits  secondary  to debility and encephalopathy after sepsis. 2. Patient is admitted to receive collaborative, interdisciplinary care between the physiatrist, rehab nursing staff, and therapy team. 3. Patient's level of medical complexity and substantial therapy needs in context of that medical necessity cannot be provided at a lesser intensity of care such as a SNF. 4. Patient has experienced substantial functional loss from his/her baseline which was documented above under the "Functional History" and "Functional Status" headings.  Judging by the patient's diagnosis, physical exam, and functional history, the patient has potential for functional progress which will result in measurable gains while on inpatient rehab.  These gains will be of substantial and practical use upon discharge  in facilitating mobility and self-care at the household level. 5. Physiatrist will provide 24 hour management of medical needs as well as oversight of the therapy plan/treatment and provide guidance as appropriate regarding the interaction of the two. 6. 24 hour rehab nursing  will assist with bladder management, bowel management, safety, skin/wound care, disease management, medication administration, pain management and patient education  and help integrate therapy concepts, techniques,education, etc. 7. PT will assess and treat for/with: Lower extremity strength, range of motion, stamina, balance, functional mobility, safety, adaptive techniques and equipment, NMR, pain mgt, skin mgt, family education, community reintegration.   Goals are: mod I to supervision. 8. OT will assess and treat for/with: ADL's, functional mobility, safety, upper extremity strength, adaptive techniques and equipment, NMR, pain mgt, skin care, ego support, family ed.   Goals are: supervision to mod I. Therapy may proceed with showering this patient. 9. SLP will assess and treat for/with: cognition, communication.  Goals are: mod I to  supervision. 10. Case Management and Social Worker will assess and treat for psychological issues and discharge planning. 11. Team conference will be held weekly to assess progress toward goals and to determine barriers to discharge. 12. Patient will receive at least 3 hours of therapy per day at least 5 days per week. 13. ELOS: 8-12 days       14. Prognosis:  excellent     [name], MD, [place] [facility] 01/16/2015   01/16/2015

## 2015-01-16 NOTE — Progress Notes (Signed)
Pt transferred to Rehab from 2C09. Husband at bedside.  A&O x 3 with mild cognitive deficits. Diagnostic specific information provided, familly orientated to rehab expectations and therapy schedule.

## 2015-01-16 NOTE — Discharge Summary (Signed)
Physician Discharge Summary  Becky Rogers VEL:[id number] DOB: October 06, 1950 DOA: 01/09/2015  PCP: Pcp Not In System  Admit date: 01/09/2015 Discharge date: 01/16/2015  Time spent: 40 minutes  Recommendations for Outpatient Follow-up:  Urosepsis/Escherichia coli septic shock -Resolved, although per husband cognition has not returned to normal..  -Will decrease Solu-Cortef to 25 mg BID, as patient is hemodynamically stable.  Left Ureteral Stone  - s/p left neph tube 01/10/15 for urgent decompression from 7.100mm left prox ureteral stone with mild-mod hydro and ipsilateral approx 90mm lower pole stone by ER CT 01/09/15 on eval left abdominal pain and septic shock. No prior nephrolithiasis.  -Urosepsis Clearing   Acute renal failure -Creatinine trending down continue to monitor closely  Leukocytosis -Most likely due to high-dose steroids -Patient's leukocytosis trending down as started her tapered. Afebrile overnight agree most likely cause of leukocytosis is high steroid-induced dose.   Ischemic injury to fingers/toes -Discussed these findings as a sequelae of her septic shock, advised patient and husband  will need to watch her toes especially on the left foot (second through fifth metatarsal) carefully. - Expect tissue loss/sloughing of skin -Counseled that the fact toes are warm and she maintains touch sensation is a promising sign however still could lose tip of toes. -Fingers are erythematous but improving.  Thrombocytopenia / coagulopathy -Due to severe gram-negative rod sepsis - slowly improving  Toxic metabolic encephalopathy -Likely a combination of ICU delirium, high-dose steroids dosing, and baseline Parkinson's related mild dementia - expect to continue to slowly improve  Hypokalemia -K-Dur  16meq Daily  -Monitor closely  Possible cholelithiasis - peripancreatic and periportal fluid on CT abdomen -No clinical signs at this time of active infection - follow   Acute hypoxic  respiratory failure -Resolved  Basilar opacifications on CT chest - acute lung injury -Would recommend PCXR in 72 hours to check for no interval change  Parkinson's disease / mild dementia -Resume usual home medications    Discharge Diagnoses:  Active Problems:   Septic shock   Pyonephrosis   Abdominal pain, lower   Hypoxia   Pyelonephritis   Altered mental status   Encounter for central line placement   Pulmonary infiltrates   Acute renal failure syndrome   E. 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 40 lb weight loss past 6 months. She was diagnosed with Parkinson's in 11/2014, and has been having trouble getting medications sorted out. Has been struggling with occasional constipation and abd pain, however, feels as though she has been healthy with minimal limitations. 6/29 PM she developed acute onset abdominal pain. Felt as though this may be a bd case of constipation so she waited on it. 6/30 She developed worsening abdominal pain, with malaise and fevers. She presented to ER for this reason. In ED she was noted to be pale and diaphoretic. FAST exam was negative. She was profoundly hypotensive, which was was not responsive to 3L IVF. She was started on norepinephrine and PCCM was  called for admission  Hospital Course:  7/7 A/O 4, NAD, patient has been concerned about her toes and fingertips.  Procedures/significant events: 6/30 admitted for presumed sepsis 6/30 CT abd > 61mm L UPJ stone with mild  dhydronephrosis, gallbladder sludge vs small stones, peripancreatic fluid. B lower lobe pulmonary opacities.  6/30 left perc nephrostomy tube placed. GNR growing in urine. 7/1 Left Perc nephrostomy tube 7/3 weaned from Pressors 7/3 CT head> no intracranial abnormalities 7/4 SLP eval > dysphagia 2 with thin liquid. 7/4 CXR > Pulmonary Edema with Bilateral Pleural Effusion 7/5Changed diet to dysphagia 3  7/6 diet changed to regular w/ thin liquids   Consultations: Dr.Michael Shick (IR) Dr.Theodore Manny (Urology)   Antibiotics Ciprofloxacin 6/30 > 7/2 ceftaz 7/1 > 7/2 Imipenem 7/2 > 7/3 Ceftriaxone 7/3 >    Discharge Exam: Filed Vitals:   01/16/15 0500 01/16/15 0742 01/16/15 1256 01/16/15 1522  BP:  131/66 118/65 111/85  Pulse:  53 69 60  Temp:   97.3 F (36.3 C) 97.3 F (36.3 C)  TempSrc:   Oral Oral  Resp:  14 12 19   Height:      Weight: 59.2 kg (130 lb 8.2 oz)     SpO2:  97% 99% 98%    General: A/O 4, NAD Cardiovascular: Regular rhythm and rate, negative murmurs rubs or gallops, normal S1/S2 Respiratory: Clear to auscultation bilateral Musculoskeletal; bilateral feet with erythema, warm to touch tips of toes on the left foot purplish but sensation intact and warm to touch. Bilateral fingers blanching erythema, warm to touch   Discharge Instructions     Medication List    TAKE these medications        amLODipine 5 MG tablet  Commonly known as:  NORVASC  Take 5 mg by mouth daily.     aspirin 81 MG tablet  Take 81 mg by mouth daily.     atorvastatin 40 MG tablet  Commonly known as:  LIPITOR  Take 40 mg by mouth daily.     CALCIUM-VITAMIN D PO  Take 2 tablets by mouth daily.     carbidopa-levodopa 25-100 MG per tablet  Commonly known as:  SINEMET IR  Take 2 tablets by mouth 3 (three) times daily.     cefTRIAXone 40 MG/ML IVPB  Commonly known as:  ROCEPHIN  Inject 50 mLs (2 g total) into the vein daily.     clonazePAM 0.5 MG tablet  Commonly  known as:  KLONOPIN  Take 0.5 mg by mouth at bedtime.     fish oil-omega-3 fatty acids 1000 MG capsule  Take 3 g by mouth daily.     HYDROcodone-acetaminophen 5-325 MG per tablet  Commonly known as:  NORCO  Take 1-2 tablets by mouth every 6 (six) hours as needed for moderate pain or severe pain.     hydrocortisone sodium succinate 100 MG Solr injection  Commonly known as:  SOLU-CORTEF  Inject 0.5 mLs (25 mg total) into the vein every 12 (twelve) hours.  Start taking on:  01/17/2015     multivitamins ther. w/minerals Tabs tablet  Take 1 tablet by mouth daily.     potassium chloride SA 20 MEQ tablet  Commonly known as:  K-DUR,KLOR-CON  Take 2 tablets (40 mEq total) by mouth daily.     TRIHEXYPHENIDYL HCL PO  Take 1 tablet by mouth 2 (two) times daily.       No Known Allergies    The results of significant diagnostics from this hospitalization (including imaging,  microbiology, ancillary and laboratory) are listed below for reference.    Significant Diagnostic Studies: Ct Abdomen Pelvis Wo Contrast  01/09/2015   CLINICAL DATA:  64 year old female with weight loss and abdominal pain.  EXAM: CT ABDOMEN AND PELVIS WITHOUT CONTRAST  TECHNIQUE: Multidetector CT imaging of the abdomen and pelvis was performed following the standard protocol without IV contrast.  COMPARISON:  None.  FINDINGS: Evaluation of this exam is limited in the absence of intravenous contrast. Evaluation is also limited due to streak artifact caused by patient's arms.  Lower chest: Patchy bilateral lower lobe ground-glass airspace opacity as well as bilateral interstitial prominence may be related to congestive changes or pneumonia. Clinical correlation is recommended. There is coronary vascular calcification. The  Peritoneum: No intraperitoneal free air. There is diffuse stranding of the upper abdominal mesentery with small amount of subhepatic/pericholecystic fluid.  Liver: Unremarkable.Mild periportal edema.   Gallbladder: There is high density contained within the gallbladder likely representing sludge or small stones. There is small amount of pericholecystic fluid. Ultrasound is recommended for better evaluation of the gallbladder.  Pancreas: There is mild peripancreatic stranding. Correlation with pancreatic enzymes recommended to exclude pancreatitis.No ductal dilatation.  Spleen: Unremarkable.  Adrenals glands: Unremarkable.  Kidneys, ureters, urinary bladder: There is an 8 mm left UPJ stone with mild left hydronephrosis. Small nonobstructing left renal inferior pole calculus noted. The right kidney is unremarkable. StopThe urinary bladder is grossly unremarkable.  Reproductive:  Unremarkable.  Bowel and appendix:Loose stool noted throughout the colon. No bowel obstruction. The appendix is not visualized with certainty. No inflammatory changes identified in the right lower quadrant.  Vascular/Lymphatic: Mild atherosclerotic calcification of the aorta.No lymphadenopathy.  Abdominal wall/Musculoskeletal: Degenerative changes of the spine.  IMPRESSION: An 8 mm left UPJ stone with mild left hydronephrosis.  Gallbladder sludge versus small stones. Correlation with ultrasound is recommended for better evaluation of the gallbladder.  Upper abdominal, peripancreatic and periportal fluid. Correlation with pancreatic enzymes as well as liver function tests recommended.  No evidence of bowel obstruction.  Bilateral lower lobe pulmonary opacities may represent congestive changes versus pneumonia. The constellation of findings may represent underlying sepsis. Correlation with clinical exam and lab values and cultures recommended.   Electronically Signed   By: Anner Crete M.D.   On: 01/09/2015 23:18   Ct Head Wo Contrast  01/12/2015   CLINICAL DATA:  Altered mental status, sepsis.  EXAM: CT HEAD WITHOUT CONTRAST  TECHNIQUE: Contiguous axial images were obtained from the base of the skull through the vertex without  intravenous contrast.  COMPARISON:  None.  FINDINGS: No acute intracranial abnormality. Specifically, no hemorrhage, hydrocephalus, mass lesion, acute infarction, or significant intracranial injury. No acute calvarial abnormality. Visualized paranasal sinuses and mastoids clear. Orbital soft tissues unremarkable.  IMPRESSION: No acute intracranial abnormality.   Electronically Signed   By: Rolm Baptise M.D.   On: 01/12/2015 13:09   US Abdomen Port  01/11/2015   CLINICAL DATA:  Evaluate gallbladder.  EXAM: ULTRASOUND PORTABLE ABDOMEN  COMPARISON:  CT of 01/09/2015  FINDINGS: Gallbladder: Gallbladder distension at 8.4 cm. Sludge within. Wall thickness upper normal. No pericholecystic fluid. Sonographic Murphy's sign was not elicited.  Common bile duct: Diameter: Upper normal for age, 7 mm. No obstruction on prior CT.  Liver: Mild intrahepatic ductal dilatation within the left lobe. No focal liver lesion.  IVC: No abnormality visualized.  Pancreas: Visualized portion unremarkable.  Spleen: Size and appearance within normal limits.  Right Kidney: Length: 11.3 cm. No hydronephrosis. Mild increased  echogenicity.  Left Kidney: Length: 11.7 cm. Poorly evaluated. No gross hydronephrosis.  Abdominal aorta: No aneurysm visualized.  Other findings: Bilateral pleural effusions.  IMPRESSION: 1. Gallbladder sludge and gallbladder distention, without specific evidence of acute cholecystitis. 2. Upper normal common duct size with mild intrahepatic duct dilatation in the left hepatic lobe. No obstructive mass or stone seen on recent CT. If bilirubin is elevated, MRCP may be informative to exclude otherwise occult obstruction. 3. Small bilateral pleural effusions. 4. Suboptimal evaluation of the left kidney. 5. Increased echogenicity within the right kidney may represent medical renal disease.   Electronically Signed   By: Abigail Miyamoto M.D.   On: 01/11/2015 19:34   Dg Chest Port 1 View  01/15/2015   CLINICAL DATA:  Shortness of  Breath  EXAM: PORTABLE CHEST - 1 VIEW  COMPARISON:  January 13, 2015  FINDINGS: There has been considerable clearing of interstitial edema compared to 2 days prior. There remains left lower lobe consolidation with left effusion. There is a smaller right effusion. There is atelectatic change in the right mid lung. Heart is upper normal in size with pulmonary vascularity within normal limits. No adenopathy.  IMPRESSION: Considerable clearing of interstitial edema compared to recent prior study. Persistent left lower lobe consolidation with small left effusion. A smaller right effusion is also present. Right midlung atelectatic change present.   Electronically Signed   By: Lowella Grip III M.D.   On: 01/15/2015 07:14   Dg Chest Port 1 View  01/13/2015   CLINICAL DATA:  Pulmonary infiltrate  EXAM: PORTABLE CHEST - 1 VIEW  COMPARISON:  01/11/2015  FINDINGS: Stable positioning of left IJ central line, tip at the upper cavoatrial junction.  Stable borderline cardiomegaly.  Negative aortic and hilar contours.  Increased hazy density over the lower chest compatible with pleural fluid. The change could reflect increase or differential layering. Diffuse interstitial opacities compatible with edema and confirmed on abdominal CT 01/09/2015.  IMPRESSION: Pulmonary edema with bilateral pleural effusion. Left pleural fluid could have increased or layered.   Electronically Signed   By: Monte Fantasia M.D.   On: 01/13/2015 06:12   Dg Chest Portable 1 View  01/11/2015   CLINICAL DATA:  Hypoxia.  Septic shock.  Pyelonephritis.  EXAM: PORTABLE CHEST - 1 VIEW  COMPARISON:  Chest x-rays dated 01/10/2015, 01/09/2015 and 05/08/2007  FINDINGS: Central line appears in good position at the cavoatrial junction. Increasing bilateral pulmonary infiltrates with new bilateral pleural effusions. The findings consistent with pulmonary edema. The possibility of ARDS should be considered. Heart size is normal. Slight pulmonary vascular  prominence.  IMPRESSION: Progressive bilateral pulmonary infiltrates and pleural effusions. Findings are consistent with pulmonary edema.   Electronically Signed   By: Lorriane Shire M.D.   On: 01/11/2015 13:32   Dg Chest Port 1 View  01/10/2015   CLINICAL DATA:  Hypertension.  Difficulty breathing  EXAM: PORTABLE CHEST - 1 VIEW  COMPARISON:  Study obtained earlier in the day and January 09, 2015  FINDINGS: Central catheter tip is in the superior vena cava. No pneumothorax. There is consolidation in the left base which was not present 1 day prior. There is subtle increased opacity in the medial right base, also new from 1 day prior. Elsewhere lungs are clear. Heart size and pulmonary vascularity are normal. No adenopathy. No bone lesions.  IMPRESSION: Airspace consolidation in the medial left base. More subtle opacity in the medial right base, concerning for early pneumonia as well. Lungs elsewhere clear.  No change in cardiac silhouette.   Electronically Signed   By: Lowella Grip III M.D.   On: 01/10/2015 07:58   Dg Chest Port 1 View  01/10/2015   CLINICAL DATA:  64 year old female with central line placement.  EXAM: PORTABLE CHEST - 1 VIEW  COMPARISON:  Chest radiograph dated 01/09/2015.  FINDINGS: Left IJ central line with tip or central SVC.  No pneumothorax.  There bilateral perihilar haziness concerning for a degree of congestion. No focal consolidation. No pleural effusion. The cardiac silhouette is within normal limits. The osseous structures are grossly unremarkable.  IMPRESSION: Left IJ central line the tip over central SVC.  No pneumothorax.  Bilateral perihilar haziness, increased from earlier study.   Electronically Signed   By: Anner Crete M.D.   On: 01/10/2015 00:52   Dg Chest Portable 1 View  01/09/2015   CLINICAL DATA:  Weakness and cyanosis.  EXAM: PORTABLE CHEST - 1 VIEW  COMPARISON:  05/08/2007  FINDINGS: A single AP portable view of the chest demonstrates no focal airspace  consolidation or alveolar edema. The lungs are grossly clear. There is no large effusion or pneumothorax. Cardiac and mediastinal contours appear unremarkable.  IMPRESSION: No active disease.   Electronically Signed   By: Andreas Newport M.D.   On: 01/09/2015 21:25   Dg Abd Portable 1v  01/14/2015   CLINICAL DATA:  Pyelonephritis.  Nephrostomy.  EXAM: PORTABLE ABDOMEN - 1 VIEW  COMPARISON:  None.  FINDINGS: A left nephrostomy tube is noted with a single well in the end of the tube in the expected vicinity of the left collecting system.  There is a potential calcification projecting over the more medial portion of the coil, measuring up to 6 mm in long axis.  Bowel gas pattern unremarkable.  IMPRESSION: 1. Left nephrostomy tube with single cul well in the expected position of the left collecting system. A calculus projects over the medial margin of the loop.   Electronically Signed   By: Van Clines M.D.   On: 01/14/2015 19:29   Ir Nephrostogram Left Initial Placement  01/10/2015   CLINICAL DATA:  64 year old female with obstructing left UVJ stone and urosepsis likely secondary to obstructed pyonephrosis. She requires urgent decompression of the left renal collecting system and presents for placement of a percutaneous nephrostomy tube.  EXAM: IR NEPHROSTOGRAM INI PLACEMENT LEFT  Date: 01/10/2015  PROCEDURE: 1. Ultrasound-guided puncture of a posterior interpolar calyx 2. Placement of a 10 French percutaneous nephrostomy tube under fluoroscopic guidance Interventional Radiologist:  Criselda Peaches, MD  ANESTHESIA/SEDATION: 1 mg Versed said administered for anxiolysis. Additional sedation could not be administered secondary to significant hypotension despite pressor support.  MEDICATIONS: 400 mg ciprofloxacin administered intravenously just prior to patient arriving in interventional radiology  FLUOROSCOPY TIME:  1 minutes 36 seconds  CONTRAST:  37mL OMNIPAQUE IOHEXOL 300 MG/ML  SOLN  TECHNIQUE: Informed  consent was obtained from the patient following explanation of the procedure, risks, benefits and alternatives. The patient understands, agrees and consents for the procedure. All questions were addressed. A time out was performed.  Maximal barrier sterile technique utilized including caps, mask, sterile gowns, sterile gloves, large sterile drape, hand hygiene, and Betadine skin prep. The left flank was interrogated with ultrasound. The kidney is easily identified and is mildly hydronephrotic. A suitable skin entry site was selected and marked. Local anesthesia was attained by infiltration with 1% lidocaine. A small dermatotomy was made. Under real-time sonographic guidance, a posterior calyx in the interpolar  region was punctured using a 21 gauge Accustick needle. A gentle hand injection of contrast material under fluoroscopy confirmed access to the renal collecting system.  A 0.018 inch wire was advanced into the renal pelvis followed by the Accustick sheath. The 0.018 wire was then exchanged for a short 0.035 Amplatz wire. The tract was then dilated to 10 [place] and a Cook 10.2 [place] all-purpose drainage catheter advanced over the wire and formed within the renal pelvis. A small amount of urine was aspirated and sent for culture.  A gentle hand injection of contrast material confirmed location of the tube within the renal pelvis. There is complete obstruction at the UPJ secondary to the presence of the obstructing stone.  The tube was connected to gravity bag drainage and secured to the skin with 0 Prolene suture and an adhesive fixation device.  COMPLICATIONS: None  IMPRESSION: Successful placement of a 10 French left-sided percutaneous nephrostomy tube.  A small aspirate of urine was sent for culture.  Signed,  Criselda Peaches, MD  [facility]  [facility]   Electronically Signed   By: Jacqulynn Cadet M.D.   On: 01/10/2015 07:43    Microbiology: Recent  Results (from the past 240 hour(s))  Blood culture (routine x 2)     Status: None   Collection Time: 01/09/15  9:27 PM  Result Value Ref Range Status   Specimen Description BLOOD RIGHT ARM  Final   Special Requests BOTTLES DRAWN AEROBIC AND ANAEROBIC 5CC  Final   Culture  Setup Time   Final    GRAM NEGATIVE RODS IN BOTH AEROBIC AND ANAEROBIC BOTTLES CRITICAL RESULT CALLED TO, READ BACK BY AND VERIFIED WITH: Mariea Clonts, RN AT [place] [id number] BY S. YARBROUGH    Culture ESCHERICHIA COLI  Final   Report Status 01/12/2015 FINAL  Final   Organism ID, Bacteria ESCHERICHIA COLI  Final      Susceptibility   Escherichia coli - MIC*    AMPICILLIN 8 SENSITIVE Sensitive     CEFAZOLIN <=4 SENSITIVE Sensitive     CEFEPIME <=1 SENSITIVE Sensitive     CEFTAZIDIME <=1 SENSITIVE Sensitive     CEFTRIAXONE <=1 SENSITIVE Sensitive     CIPROFLOXACIN <=0.25 SENSITIVE Sensitive     GENTAMICIN <=1 SENSITIVE Sensitive     IMIPENEM <=0.25 SENSITIVE Sensitive     TRIMETH/SULFA <=20 SENSITIVE Sensitive     AMPICILLIN/SULBACTAM 4 SENSITIVE Sensitive     PIP/TAZO <=4 SENSITIVE Sensitive     * ESCHERICHIA COLI  Blood culture (routine x 2)     Status: None   Collection Time: 01/09/15  9:34 PM  Result Value Ref Range Status   Specimen Description BLOOD RIGHT HAND  Final   Special Requests BOTTLES DRAWN AEROBIC ONLY 3CC  Final   Culture  Setup Time   Final    GRAM NEGATIVE RODS AEROBIC BOTTLE ONLY CRITICAL RESULT CALLED TO, READ BACK BY AND VERIFIED WITH: T HARVEY 01/10/15 @ 52 M VESTAL    Culture   Final    ESCHERICHIA COLI SUSCEPTIBILITIES PERFORMED ON PREVIOUS CULTURE WITHIN THE LAST 5 DAYS.    Report Status 01/13/2015 FINAL  Final  MRSA PCR Screening     Status: None   Collection Time: 01/09/15 11:52 PM  Result Value Ref Range Status   MRSA by PCR NEGATIVE NEGATIVE Final    Comment:        The GeneXpert MRSA Assay (FDA approved for NASAL specimens only), is one component  of a comprehensive MRSA  colonization surveillance program. It is not intended to diagnose MRSA infection nor to guide or monitor treatment for MRSA infections.   Urine culture     Status: None   Collection Time: 01/10/15  1:46 AM  Result Value Ref Range Status   Specimen Description URINE, CATHETERIZED  Final   Special Requests NONE  Final   Culture >=100,000 COLONIES/mL ESCHERICHIA COLI  Final   Report Status 01/12/2015 FINAL  Final   Organism ID, Bacteria ESCHERICHIA COLI  Final      Susceptibility   Escherichia coli - MIC*    AMPICILLIN 8 SENSITIVE Sensitive     CEFAZOLIN <=4 SENSITIVE Sensitive     CEFTRIAXONE <=1 SENSITIVE Sensitive     CIPROFLOXACIN <=0.25 SENSITIVE Sensitive     GENTAMICIN <=1 SENSITIVE Sensitive     IMIPENEM <=0.25 SENSITIVE Sensitive     NITROFURANTOIN <=16 SENSITIVE Sensitive     TRIMETH/SULFA <=20 SENSITIVE Sensitive     AMPICILLIN/SULBACTAM 4 SENSITIVE Sensitive     PIP/TAZO <=4 SENSITIVE Sensitive     * >=100,000 COLONIES/mL ESCHERICHIA COLI  Clostridium Difficile by PCR (not at [facility])     Status: None   Collection Time: 01/11/15 11:08 AM  Result Value Ref Range Status   C difficile by pcr NEGATIVE NEGATIVE Final     Labs: Basic Metabolic Panel:  Recent Labs Lab 01/10/15 0050  01/11/15 0648  01/13/15 0400 01/14/15 0245 01/15/15 0317 01/15/15 1656 01/16/15 0216  NA 138  < > 132*  < > 140 140 143 142 143  K 3.0*  < > 4.3  < > 3.8 3.7 2.8* 3.0* 3.3*  CL 108  < > 107  < > 103 104 107 107 110  CO2 15*  < > 14*  < > 23 24 25 23 24   GLUCOSE 150*  < > 119*  < > 85 112* 119* 123* 103*  BUN 38*  < > 47*  < > 69* 91* 94* 86* 78*  CREATININE 2.62*  < > 3.23*  < > 3.76* 3.43* 2.80* 2.38* 2.03*  CALCIUM 6.9*  < > 7.0*  < > 8.0* 7.9* 7.8* 8.0* 7.6*  MG 1.4*  --  1.8  --   --   --  2.0  --   --   PHOS 2.9  --   --   --   --   --  3.7  --   --   < > = values in this interval not displayed. Liver Function Tests:  Recent Labs Lab 01/10/15 0050 01/11/15 0648  01/13/15 0400 01/15/15 1656 01/16/15 0216  AST 49* 54* 38 58* 38  ALT 23 13* 8* 21 8*  ALKPHOS 154* 106 231* 247* 206*  BILITOT 1.1 1.5* 1.2 1.0 0.8  PROT 4.4* 4.1* 4.8* 5.0* 4.6*  ALBUMIN 2.5* 1.9* 2.0* 2.4* 2.1*   No results for input(s): LIPASE, AMYLASE in the last 168 hours. No results for input(s): AMMONIA in the last 168 hours. CBC:  Recent Labs Lab 01/12/15 0353 01/13/15 0400 01/14/15 0245 01/15/15 0317 01/16/15 0216  WBC 26.7* 42.8* 55.3* 55.6* 43.8*  HGB 12.8 13.8 13.2 12.3 11.9*  HCT 36.3 38.8 37.8 35.5* 35.0*  MCV 85.0 84.9 84.9 85.5 86.2  PLT 24* 24* 29* 53* 76*   Cardiac Enzymes: No results for input(s): CKTOTAL, CKMB, CKMBINDEX, TROPONINI in the last 168 hours. BNP: BNP (last 3 results) No results for input(s): BNP in the last 8760 hours.  ProBNP (last  3 results) No results for input(s): PROBNP in the last 8760 hours.  CBG:  Recent Labs Lab 01/13/15 1653 01/13/15 1959 01/14/15 0021 01/14/15 [id number] 01/14/15 2016  GLUCAP 92 112* 99 101* 115*       Signed:  Dia Crawford, MD [facility] [phone] pager

## 2015-01-16 NOTE — Interval H&P Note (Signed)
Becky Rogers was admitted today to Inpatient Rehabilitation with the diagnosis of debility/encephalopathy.  The patient's history has been reviewed, patient examined, and there is no change in status.  Patient continues to be appropriate for intensive inpatient rehabilitation.  I have reviewed the patient's chart and labs.  Questions were answered to the patient's satisfaction. The PAPE has been reviewed and assessment remains appropriate.  Willeen Novak T 01/16/2015, 6:28 PM

## 2015-01-16 NOTE — Progress Notes (Addendum)
For transport to Rehab and fired drill called. Pt remained in room. Admissions coordinator and husband present in room.

## 2015-01-16 NOTE — Progress Notes (Signed)
Physical Therapy Treatment Patient Details Name: Becky Rogers MRN: [id number] DOB: [birth year] Today's Date: 01/16/2015    History of Present Illness 64 year old female with parkinsons dz presented 6/30 with abdominal pain. In ED she was profoundly hypotensive which was not responsive to IVF resuscitation. Lactic acid elevation, started on pressors. AMS noted. Pt with sepsis, pleural effusion, nephrostomy tube and decreaed bil LE circulation secondary to pressors    PT Comments    Pt continues to progress with cognition, balance and activity tolerance but remains with difficulty with gait with head turns particularly without DME. Pt with decreased carryover of education, memory, and lack of awareness. Will continue to follow and pt eager to have as much therapy as possible to return to caring for herself.   Follow Up Recommendations  CIR;Supervision/Assistance - 24 hour     Equipment Recommendations       Recommendations for Other Services       Precautions / Restrictions Precautions Precautions: Fall    Mobility  Bed Mobility Overal bed mobility: Needs Assistance       Supine to sit: Supervision     General bed mobility comments: supervision for safety as pt impulsive despite cues  Transfers Overall transfer level: Needs assistance   Transfers: Sit to/from Stand Sit to Stand: Min guard         General transfer comment: impulsive with sit to stand, cues not to pull on RW  Ambulation/Gait Ambulation/Gait assistance: Min assist Ambulation Distance (Feet): 300 Feet Assistive device: Rolling walker (2 wheeled);None Gait Pattern/deviations: Step-through pattern;Decreased stride length   Gait velocity interpretation: Below normal speed for age/gender General Gait Details: pt used RW without LOB with cues for direction and position in RW. Walked half distance without RW with 3 LOb with turning with tactile cues to recover   Stairs            Wheelchair  Mobility    Modified Rankin (Stroke Patients Only)       Balance Overall balance assessment: Needs assistance   Sitting balance-Leahy Scale: Good       Standing balance-Leahy Scale: Fair                      Cognition Arousal/Alertness: Awake/alert Behavior During Therapy: Impulsive Overall Cognitive Status: Impaired/Different from baseline Area of Impairment: Orientation;Memory;Safety/judgement;Awareness;Problem solving Orientation Level: Disoriented to;Time Current Attention Level: Selective Memory: Decreased short-term memory Following Commands: Follows one step commands consistently Safety/Judgement: Decreased awareness of safety;Decreased awareness of deficits          Exercises General Exercises - Lower Extremity Long Arc Quad: AROM;Seated;Both;20 reps Hip Flexion/Marching: AROM;Seated;Both;20 reps    General Comments        Pertinent Vitals/Pain Pain Assessment: No/denies pain  VSS    Home Living                      Prior Function            PT Goals (current goals can now be found in the care plan section) Progress towards PT goals: Progressing toward goals    Frequency       PT Plan Current plan remains appropriate    Co-evaluation             End of Session Equipment Utilized During Treatment: Gait belt Activity Tolerance: Patient tolerated treatment well Patient left: in chair;with call bell/phone within reach;with chair alarm set     Time: 9935-7017 PT Time  Calculation (min) (ACUTE ONLY): 28 min  Charges:  $Gait Training: 8-22 mins $Physical Performance Test: 8-22 mins                    G Codes:      Melford Aase January 17, 2015, 9:09 AM Elwyn Reach, [place]

## 2015-01-17 ENCOUNTER — Inpatient Hospital Stay (HOSPITAL_COMMUNITY): Payer: BLUE CROSS/BLUE SHIELD | Admitting: Physical Therapy

## 2015-01-17 ENCOUNTER — Inpatient Hospital Stay (HOSPITAL_COMMUNITY): Payer: BLUE CROSS/BLUE SHIELD | Admitting: Speech Pathology

## 2015-01-17 ENCOUNTER — Inpatient Hospital Stay (HOSPITAL_COMMUNITY): Payer: BLUE CROSS/BLUE SHIELD | Admitting: Occupational Therapy

## 2015-01-17 DIAGNOSIS — N179 Acute kidney failure, unspecified: Secondary | ICD-10-CM

## 2015-01-17 DIAGNOSIS — G20 Parkinson's disease: Secondary | ICD-10-CM

## 2015-01-17 DIAGNOSIS — G9341 Metabolic encephalopathy: Secondary | ICD-10-CM

## 2015-01-17 DIAGNOSIS — N12 Tubulo-interstitial nephritis, not specified as acute or chronic: Secondary | ICD-10-CM

## 2015-01-17 DIAGNOSIS — R5381 Other malaise: Secondary | ICD-10-CM

## 2015-01-17 LAB — COMPREHENSIVE METABOLIC PANEL
ALT: 10 U/L — ABNORMAL LOW (ref 14–54)
ANION GAP: 5 (ref 5–15)
AST: 32 U/L (ref 15–41)
Albumin: 2.1 g/dL — ABNORMAL LOW (ref 3.5–5.0)
Alkaline Phosphatase: 171 U/L — ABNORMAL HIGH (ref 38–126)
BUN: 58 mg/dL — ABNORMAL HIGH (ref 6–20)
CO2: 23 mmol/L (ref 22–32)
Calcium: 7.7 mg/dL — ABNORMAL LOW (ref 8.9–10.3)
Chloride: 114 mmol/L — ABNORMAL HIGH (ref 101–111)
Creatinine, Ser: 1.42 mg/dL — ABNORMAL HIGH (ref 0.44–1.00)
GFR calc non Af Amer: 38 mL/min — ABNORMAL LOW (ref >60)
GFR, EST AFRICAN AMERICAN: 44 mL/min — AB (ref >60)
GLUCOSE: 131 mg/dL — AB (ref 65–99)
Potassium: 3.8 mmol/L (ref 3.5–5.1)
SODIUM: 142 mmol/L (ref 135–145)
Total Bilirubin: 0.5 mg/dL (ref 0.3–1.2)
Total Protein: 4.3 g/dL — ABNORMAL LOW (ref 6.5–8.1)

## 2015-01-17 LAB — CBC WITH DIFFERENTIAL/PLATELET
Basophils Absolute: 0 10*3/uL (ref 0.0–0.1)
Basophils Relative: 0 % (ref 0–1)
Eosinophils Absolute: 0 10*3/uL (ref 0.0–0.7)
Eosinophils Relative: 0 % (ref 0–5)
HCT: 33 % — ABNORMAL LOW (ref 36.0–46.0)
HEMOGLOBIN: 11.3 g/dL — AB (ref 12.0–15.0)
LYMPHS ABS: 1.2 10*3/uL (ref 0.7–4.0)
LYMPHS PCT: 3 % — AB (ref 12–46)
MCH: 29.7 pg (ref 26.0–34.0)
MCHC: 34.2 g/dL (ref 30.0–36.0)
MCV: 86.6 fL (ref 78.0–100.0)
MONO ABS: 1.6 10*3/uL — AB (ref 0.1–1.0)
Monocytes Relative: 4 % (ref 3–12)
NEUTROS PCT: 93 % — AB (ref 43–77)
Neutro Abs: 37 10*3/uL — ABNORMAL HIGH (ref 1.7–7.7)
Platelets: 120 10*3/uL — ABNORMAL LOW (ref 150–400)
RBC: 3.81 MIL/uL — ABNORMAL LOW (ref 3.87–5.11)
RDW: 14.5 % (ref 11.5–15.5)
WBC: 39.8 10*3/uL — AB (ref 4.0–10.5)

## 2015-01-17 MED ORDER — PRO-STAT SUGAR FREE PO LIQD
30.0000 mL | Freq: Two times a day (BID) | ORAL | Status: DC
  Administered 2015-01-18 – 2015-01-25 (×15): 30 mL via ORAL
  Filled 2015-01-17 (×18): qty 30

## 2015-01-17 NOTE — Evaluation (Signed)
Speech Language Pathology Assessment and Plan  Patient Details  Name: Becky Rogers MRN: [id number] Date of Birth: [birth year]  SLP Diagnosis: Cognitive Impairments  Rehab Potential: Excellent ELOS: 7-10 days    Today's Date: 01/17/2015 SLP Individual Time: 1100-1200 SLP Individual Time Calculation (min): 60 min   Problem List:  Patient Active Problem List   Diagnosis Date Noted  . Debility 01/16/2015  . Acute renal failure syndrome   . E. coli UTI   . Leukocytosis   . 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 Thrombocytopenia   . Metabolic encephalopathy   . Acute respiratory failure with hypoxia   . Parkinson's disease   . Altered mental status   . Encounter for central line placement   . Pulmonary infiltrates   . Abdominal pain, lower   . Hypoxia   . Pyelonephritis   . Pyonephrosis   . Septic shock 01/09/2015  . Abnormal mammogram of right breast 08/28/2014   Past Medical History:  Past Medical History  Diagnosis Date  . Hypertension   . Hypercholesteremia   . Parkinson disease   . Breast cancer     right   Past Surgical History:  Past Surgical History  Procedure Laterality Date  . Cesarean section      x2  . Ovarian cyst removal      age 8  . Appendectomy    . Colonoscopy    . Dental surgery      implant  . Breast lumpectomy with radioactive seed localization Right 08/28/2014    Procedure: RIGHT BREAST LUMPECTOMY WITH RADIOACTIVE SEED LOCALIZATION;  Surgeon: [name], MD;  Location: [facility];  Service: General;  Laterality: Right;    Assessment / Plan / Recommendation Clinical Impression Patient is a 64 y.o. female with history of Parkinson's disease with mild dementia, breast cancer, HTN who was admitted 01/09/15 with abdominal pain, acute renal failure and hypotension due to urosepsis.  She was started on pressors, solucortef and fluids as well as broad spectrum antibiotics for septic shock. CT revealed left ureteral stone with  concerns for obstructed pyonephrosis and underwent left percutaneous nephrostomy by interventional radiology. Dr. Alinda Money recommended 14 days of culture specific antibiotics prior to any definitive stone procedure and follow up with Dr. Tresa Moore on outpatient basis.  Antibiotics changed to ceftriaxone for pansensitive E coli bacteremia.  Mentation is improving. Acute renal failure slowly improving and thrombocytopenia/coagulopathy resolving. Ischemic injury to bilateral finger and toes/feet being monitored for demarcation/gangrenous changes. Therapy ongoing and CIR recommended by MD and Rehab team. Patient was admitted 01/16/15 and demonstrates moderate cognitive impairments impacting orientation, selective attention, functional problem solving, recall of information, emergent awareness of deficits and safety due to impulsivity. Patient has h/o baseline memory impairments, however, suspect they are now exacerbated but no family members were present to confirm. Patient would benefit from skilled SLP intervention to maximize cognitive function and overall functional independence prior to discharge.   Skilled Therapeutic Interventions          Patient administered a cognitive-linguistic evaluation. Please see above for details. Patient also administered the MoCA and scored a 14/22 points with a score of 18 or above considered normal. Patient educated on goals of skilled SLP intervention and current cognitive impairments. She verbalized understanding and agreement.   SLP Assessment  Patient will need skilled Speech Lanaguage Pathology Services during CIR admission    Recommendations  Oral Care Recommendations: Oral care BID Recommendations for Other Services: Neuropsych consult Patient  destination: Home Follow up Recommendations: Outpatient SLP;24 hour supervision/assistance Equipment Recommended: None recommended by SLP    SLP Frequency 3 to 5 out of 7 days   SLP Treatment/Interventions Cognitive  remediation/compensation;Cueing hierarchy;Environmental controls;Functional tasks;Internal/external aids;Patient/family education;Therapeutic Activities    Pain Pain Assessment Pain Assessment: No/denies pain Pain Score: 0-No pain  Short Term Goals: Week 1: SLP Short Term Goal 1 (Week 1): Patient will utilize external memory aids for orientation and recall of daily activities with Min A question and verbal cues.  SLP Short Term Goal 2 (Week 1): Patient will demonstrate functional problem solving for basic and familiar tasks with Min A verbal cues.  SLP Short Term Goal 3 (Week 1): Patient will self-monitor and correct errors during functional tasks with Min A verbal and question cues. SLP Short Term Goal 4 (Week 1): Patient will demonstrate selective attention to a functional task for 30 minutes with Min A verbal cues for redirection.  SLP Short Term Goal 5 (Week 1): Patient will utilize call bell to request assistance in 75% of observable opportunities with Min A question cues.   See FIM for current functional status Refer to Care Plan for Long Term Goals  Recommendations for other services: Neuropsych  Discharge Criteria: Patient will be discharged from SLP if patient refuses treatment 3 consecutive times without medical reason, if treatment goals not met, if there is a change in medical status, if patient makes no progress towards goals or if patient is discharged from hospital.  The above assessment, treatment plan, treatment alternatives and goals were discussed and mutually agreed upon: by patient  ,  01/17/2015, 4:42 PM

## 2015-01-17 NOTE — Progress Notes (Signed)
Physical Therapy Session Note  Patient Details  Name: Becky Rogers MRN: [id number] Date of Birth: 03-07-1951  Today's Date: 01/17/2015 PT Individual Time: 1500-1530 PT Individual Time Calculation (min): 30 min   Short Term Goals: Week 1:  PT Short Term Goal 1 (Week 1): = LTGs due to anticipated LOS  Skilled Therapeutic Interventions/Progress Updates:    Pt received seated in recliner with QR belt intact; no c/o pain and agreeable to treatment. Throughout session pt demonstrates cognitive impairments and confusion; however she is able to state her location and situation and follow all directions with min cues. Performed gait x250' with CGA for 2 trials in hallways. Noted inc unsteadiness/sway when in crowded environments with people passing by, suggestive of poor balance with cognitive dual task. Performed Berg balance assessment as detailed below; most difficulty noted in reduced BOS positions. Pt reports being upset that she "failed her test" this morning because she was nervous and flustered. During gait trial back to room, pt given cog task of locating room. Pt able to correctly state she stays in room 4W20, however she required max cues to utilize room signs to locate room. Pt returned to room and asked "when are they going to come pick [place] up?"; pt informed she would be staying in the hospital over night, to which she responded "Ok I guess I'll just got get some coffee". Pt gently led to recliner and seated with feet elevated and QR belt intact at completion of session. Pt reminded twice to utilize call bell if needing to get out of bed; pt states understanding.   Therapy Documentation Precautions:  Precautions Precautions: Fall Restrictions Weight Bearing Restrictions: No Pain: Pain Assessment Pain Assessment: No/denies pain Pain Score: 0-No pain  Balance: Balance Balance Assessed: Yes Standardized Balance Assessment Standardized Balance Assessment: Berg Balance Test Berg Balance  Test Sit to Stand: Able to stand without using hands and stabilize independently Standing Unsupported: Able to stand safely 2 minutes Sitting with Back Unsupported but Feet Supported on Floor or Stool: Able to sit safely and securely 2 minutes Stand to Sit: Sits safely with minimal use of hands Transfers: Able to transfer safely, minor use of hands Standing Unsupported with Eyes Closed: Able to stand 10 seconds safely Standing Ubsupported with Feet Together: Able to place feet together independently and stand 1 minute safely From Standing, Reach Forward with Outstretched Arm: Can reach forward >12 cm safely (5") From Standing Position, Pick up Object from Floor: Able to pick up shoe, needs supervision From Standing Position, Turn to Look Behind Over each Shoulder: Looks behind one side only/other side shows less weight shift Turn 360 Degrees: Able to turn 360 degrees safely but slowly Standing Unsupported, Alternately Place Feet on Step/Stool: Able to stand independently and complete 8 steps >20 seconds Standing Unsupported, One Foot in Front: Able to take small step independently and hold 30 seconds Standing on One Leg: Able to lift leg independently and hold equal to or more than 3 seconds Total Score: 46  See FIM for current functional status  Therapy/Group: Individual Therapy  Luberta Mutter 01/17/2015, 4:24 PM

## 2015-01-17 NOTE — IPOC Note (Signed)
Overall Plan of Care [facility]) Patient Details Name: Becky Rogers MRN: [id number] DOB: 1950/07/31  Admitting Diagnosis: [facility] Problems: Principal Problem:   Debility Active Problems:   Pyelonephritis   Acute renal failure syndrome   Metabolic encephalopathy   Parkinson's disease     Functional Problem List: Nursing Bowel, Edema  PT Balance, Behavior, Endurance, Motor, Nutrition, Pain, Safety  OT Balance, Cognition, Safety  SLP Cognition  TR         Basic ADL's: OT Grooming, Bathing, Dressing, Toileting     Advanced  ADL's: OT Simple Meal Preparation     Transfers: PT Bed Mobility, Bed to Chair, Car, Furniture, Floor  OT Toilet, Tub/Shower     Locomotion: PT Stairs, Ambulation     Additional Impairments: OT None  SLP Social Cognition   Problem Solving, Memory, Attention, Social Interaction, Awareness  TR      Anticipated Outcomes Item Anticipated Outcome  Self Feeding independent  Swallowing      Basic self-care  supervision level  Toileting  modified independent   Bathroom Transfers modified independent  Bowel/Bladder  Mod I  Transfers  mod I  Locomotion  supervision  Communication     Cognition  Min A  Pain  <3  Safety/Judgment  Supervision   Therapy Plan: PT Intensity: Minimum of 1-2 x/day ,45 to 90 minutes PT Frequency: 5 out of 7 days PT Duration Estimated Length of Stay: 7-10 days OT Intensity: Minimum of 1-2 x/day, 45 to 90 minutes OT Frequency: 5 out of 7 days OT Duration/Estimated Length of Stay: 7-9 days SLP Intensity: Minumum of 1-2 x/day, 30 to 90 minutes SLP Frequency: 3 to 5 out of 7 days SLP Duration/Estimated Length of Stay: 7-10 days       Team Interventions: Nursing Interventions Disease Management/Prevention, Skin Care/Wound Management, Cognitive Remediation/Compensation  PT interventions Ambulation/gait training, Training and development officer, Cognitive remediation/compensation, Community reintegration,  Discharge planning, Disease management/prevention, DME/adaptive equipment instruction, Functional mobility training, Neuromuscular re-education, Pain management, Patient/family education, Psychosocial support, Stair training, Therapeutic Activities, Therapeutic Exercise, UE/LE Strength taining/ROM, UE/LE Coordination activities  OT Interventions Training and development officer, Community reintegration, Discharge planning, Cognitive remediation/compensation, DME/adaptive equipment instruction, Functional mobility training, Therapeutic Activities, Patient/family education, UE/LE Coordination activities, UE/LE Strength taining/ROM, Self Care/advanced ADL retraining, Neuromuscular re-education  SLP Interventions Cognitive remediation/compensation, Cueing hierarchy, Environmental controls, Functional tasks, Internal/external aids, Patient/family education, Therapeutic Activities  TR Interventions    SW/CM Interventions Discharge Planning, Psychosocial Support, Patient/Family Education    Team Discharge Planning: Destination: PT-Home ,OT- Home , SLP-Home Projected Follow-up: PT-24 hour supervision/assistance, Outpatient PT, OT-  Outpatient OT, SLP-Outpatient SLP, 24 hour supervision/assistance Projected Equipment Needs: PT-To be determined, OT- Tub/shower seat, SLP-None recommended by SLP Equipment Details: PT- , OT-  Patient/family involved in discharge planning: PT- Patient,  OT-Patient, SLP-Patient  MD ELOS: 7-10 days Medical Rehab Prognosis:  Excellent Assessment: The patient has been admitted for CIR therapies with the diagnosis of debility/encephalopathy after septic shock. The team will be addressing functional mobility, strength, stamina, balance, safety, adaptive techniques and equipment, self-care, bowel and bladder mgt, patient and caregiver education, pain mgt, skin mgt, cognition and safety awareness, ego support, community reintegration. Goals have been set at mod I for basic mobility and  self-care and min assist to supervision for cognition (baseline deficits).    [name], MD, FAAPMR      See Team Conference Notes for weekly updates to the plan of care

## 2015-01-17 NOTE — Progress Notes (Signed)
[facility] PHYSICAL MEDICINE & REHABILITATION     PROGRESS NOTE    Subjective/Complaints: A little restless last night but really did fairly well. Denies pain. Ready for therapy!  ROS: Pt denies fever, rash/itching, headache, blurred or double vision, nausea, vomiting, abdominal pain, diarrhea, chest pain, shortness of breath, palpitations, dysuria, dizziness, neck or back pain, bleeding, anxiety, or depression   Objective: Vital Signs: Blood pressure 109/66, pulse 53, temperature 97.6 F (36.4 C), temperature source Oral, resp. rate 16, height 5\' 2"  (1.575 m), SpO2 99 %. No results found.  Recent Labs  01/16/15 0216 01/17/15 0420  WBC 43.8* 39.8*  HGB 11.9* 11.3*  HCT 35.0* 33.0*  PLT 76* 120*    Recent Labs  01/16/15 0216 01/17/15 0420  NA 143 142  K 3.3* 3.8  CL 110 114*  GLUCOSE 103* 131*  BUN 78* 58*  CREATININE 2.03* 1.42*  CALCIUM 7.6* 7.7*   CBG (last 3)   Recent Labs  01/14/15 2016  GLUCAP 115*    Wt Readings from Last 3 Encounters:  01/16/15 59.2 kg (130 lb 8.2 oz)  08/22/14 51.256 kg (113 lb)    Physical Exam:  Constitutional: She is oriented to person, place, and time. She appears well-developed and well-nourished.  HENT:  Head: Normocephalic and atraumatic.  Right Ear: External ear normal.  Left Ear: External ear normal.  Mouth/Throat: Oropharynx is clear and moist.  Eyes: Conjunctivae are normal. Pupils are equal, round, and reactive to light.  Neck: Normal range of motion. Neck supple. No JVD present. No tracheal deviation present. No thyromegaly present.  Cardiovascular: Normal rate and regular rhythm. Exam reveals no friction rub.  No murmur heard. Respiratory: Effort normal. No respiratory distress. She has decreased breath sounds in the right lower field and the left lower field. She has no wheezes. She exhibits no tenderness.  GI: Soft. Bowel sounds are normal. She exhibits no distension. There is no tenderness. There is no  rebound.  Genitourinary:  Left nephrostomy draining   yellow urine.  Musculoskeletal: She exhibits edema.  Decrease in edema LUE. Neurological: She is alert and oriented to person, place, and time. No cranial nerve deficit. Coordination normal.  Oriented to situation with minimal cues. Needs redirection occasionally. Able to follow basic one step commands without difficulty. UE's 3+ prox to 4/5 distally. LE's 2+hf, 3/5 ke and 3+ ankles. Senses pain and LT in both legs. No rigidity or tremor. Masked facies. Delayed processing. No sensory deficits  Skin: Skin is warm and dry.  Ischemic areas distal fingers and toes. Toes more affected. Bilateral 3rd, 4th and 5th toes with some breakdown in webspaces. No gangrene but left 2nd toe tip still dusky---does seem to have blood flow however.. Non-tender to touch.  Psychiatric: She has a normal mood and affect. Her behavior is normal.   Assessment/Plan: 1. Functional deficits secondary to debility/encephalopathy after septic shock/prolonged hospital course which require 3+ hours per day of interdisciplinary therapy in a comprehensive inpatient rehab setting. Physiatrist is providing close team supervision and 24 hour management of active medical problems listed below. Physiatrist and rehab team continue to assess barriers to discharge/monitor patient progress toward functional and medical goals. FIM:                   Comprehension Comprehension Mode: Auditory Comprehension: 4-Understands basic 75 - 89% of the time/requires cueing 10 - 24% of the time  Expression Expression Mode: Verbal Expression: 5-Expresses basic needs/ideas: With extra time/assistive device  Social Interaction  Social Interaction: 4-Interacts appropriately 75 - 89% of the time - Needs redirection for appropriate language or to initiate interaction.  Problem Solving Problem Solving: 4-Solves basic 75 - 89% of the time/requires cueing 10 - 24% of the  time  Memory Memory: 3-Recognizes or recalls 50 - 74% of the time/requires cueing 25 - 49% of the time  Medical Problem List and Plan: 1. Functional deficits secondary to Encephalopathy and debility after sepsis 2. DVT Prophylaxis/Anticoagulation: Mechanical: Sequential compression devices, below knee Bilateral lower extremities 3. Pain Management: Tylenol prn for pain 4. Mood: LCSW to follow with patient and husband for support. 5. Neuropsych: This patient is not capable of making decisions on her own behalf. 6. Skin/Wound Care: Routine pressure relief measures. Encourage appropriate nutrition  -padding between toes 7. Fluids/Electrolytes/Nutrition: Monitor I/O. Offer supplements between meals.  8. Left ureteral stone: Left nephrostomy tube in place with good UOP. 9. AKI: Resolving post L-nephrostomy tube. Continue daily check to monitor for recovery. 10 Urosepsis due to E coli bacteremia/UTI: Continue antibiotics D # 8/ 14 days.  11. Hypokalemia: continue to replace with supplement 12. Leucocytosis: WBC remains elevated. Procalcitonin trending downwards. continue to monitor for trends and signs of infection. Steroid factor---taper  -count 39.8k today  13. Parkinson's disease: On Sinemet 50/200 tid and artane bid  14. Acute respiratory failure: Resolved. Interstitial edema improving with improvement in symptoms. Lasix on hold due to AKI. Encourage IS.  15. Insomnia: klonopin.  16. Thrombocytopenia: platelets recovering---120k today  LOS (Days) 1 A FACE TO FACE EVALUATION WAS PERFORMED  Becky Rogers T 01/17/2015 9:15 AM

## 2015-01-17 NOTE — Evaluation (Signed)
Occupational Therapy Assessment and Plan  Patient Details  Name: Becky Rogers MRN: [id number] Date of Birth: 05/04/1951  OT Diagnosis: altered mental status, cognitive deficits and muscle weakness (generalized) Rehab Potential: Rehab Potential (ACUTE ONLY): Good ELOS: 7-9 days   Today's Date: 01/17/2015 OT Individual Time: 0800-0905 OT Individual Time Calculation (min): 65 min     Problem List:  Patient Active Problem List   Diagnosis Date Noted  . Debility 01/16/2015  . Acute renal failure syndrome   . E. coli UTI   . Leukocytosis   . 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 Thrombocytopenia   . Metabolic encephalopathy   . Acute respiratory failure with hypoxia   . Parkinson's disease   . Altered mental status   . Encounter for central line placement   . Pulmonary infiltrates   . Abdominal pain, lower   . Hypoxia   . Pyelonephritis   . Pyonephrosis   . Septic shock 01/09/2015  . Abnormal mammogram of right breast 08/28/2014    Past Medical History:  Past Medical History  Diagnosis Date  . Hypertension   . Hypercholesteremia   . Parkinson disease   . Breast cancer     right   Past Surgical History:  Past Surgical History  Procedure Laterality Date  . Cesarean section      x2  . Ovarian cyst removal      age 64  . Appendectomy    . Colonoscopy    . Dental surgery      implant  . Breast lumpectomy with radioactive seed localization Right 08/28/2014    Procedure: RIGHT BREAST LUMPECTOMY WITH RADIOACTIVE SEED LOCALIZATION;  Surgeon: [name], MD;  Location: [facility];  Service: General;  Laterality: Right;    Assessment & Plan Clinical Impression: Patient is a 64 y.o. year old female with recent admission to the hospital on 01/09/15 with abdominal pain, acute renal failure and hypotension due to urosepsis. She was started on pressors, solucortef and fluids as well as broad spectrum antibiotics for septic shock. CT revealed left ureteral  stone with concerns for obstructed pyonephrosis and underwent left percutaneous nephrostomy by interventional radiology. Dr. Alinda Money recommended 14 days of culture specific antibiotics prior to any definitive stone procedure and follow up with Dr. Tresa Moore on outpatient basis. Antibiotics changed to ceftriaxone for pansensitive E coli bacteremia. Mentation is improving. Acute renal failure slowly improving and thrombocytopenia/coagulopathy resolving. Ischemic injury to bilateral finger and toes/feet being monitored for demarcation/gangrenous changes.  .  Patient transferred to CIR on 01/16/2015 .    Patient currently requires min with basic self-care skills secondary to muscle weakness and decreased awareness, decreased problem solving, decreased safety awareness, decreased memory and delayed processing.  Prior to hospitalization, patient could complete ADLs with independent .  Patient will benefit from skilled intervention to decrease level of assist with basic self-care skills, increase independence with basic self-care skills and increase level of independence with iADL prior to discharge home with care partner.  Anticipate patient will require 24 hour supervision and follow up outpatient.  OT - End of Session Activity Tolerance: Tolerates 30+ min activity with multiple rests Endurance Deficit: Yes OT Assessment Rehab Potential (ACUTE ONLY): Good Barriers to Discharge: Decreased caregiver support Barriers to Discharge Comments: pt's spouse works, unsure if he can provide 24 hour initial supervision as necessary OT Patient demonstrates impairments in the following area(s): Balance;Cognition;Safety OT Basic ADL's Functional Problem(s): Grooming;Bathing;Dressing;Toileting OT Advanced ADL's Functional Problem(s): Simple Meal  Preparation OT Transfers Functional Problem(s): Toilet;Tub/Shower OT Additional Impairment(s): None OT Plan OT Intensity: Minimum of 1-2 x/day, 45 to 90 minutes OT Frequency: 5  out of 7 days OT Duration/Estimated Length of Stay: 7-9 days OT Treatment/Interventions: Medical illustrator training;Community reintegration;Discharge planning;Cognitive remediation/compensation;DME/adaptive equipment instruction;Functional mobility training;Therapeutic Activities;Patient/family education;UE/LE Coordination activities;UE/LE Strength taining/ROM;Self Care/advanced ADL retraining;Neuromuscular re-education OT Self Feeding Anticipated Outcome(s): independent OT Basic Self-Care Anticipated Outcome(s): supervision level OT Toileting Anticipated Outcome(s): modified independent OT Bathroom Transfers Anticipated Outcome(s): modified independent OT Recommendation Patient destination: Home Follow Up Recommendations: Outpatient OT Equipment Recommended: Tub/shower seat   Skilled Therapeutic Intervention Began working on selfcare retraining shower level during session.  Pt currently min guard for sit to stand with min assist needed for functional transfers including toilet and shower.  Decreased sequencing for basic selfcare tasks such as bathing, requiring mod to max cueing to complete.  Pt did slightly better with the sequencing part of dressing, but initially attempted to donn her bra inside out as well as placing her right leg in her left pants leg.  Therapist had to provide re-direction as she was unaware of this.  Pt easily distracted with conversation from therapist during dressing as well resulting in difficulty completing dressing tasks before moving to the next step.  LOB X 1 when standing from shower seat and transferring out.  Noted impulsivity with pt attempting to stand on multiple occasions without warning even though therapist directed her not to unless he was next to her.      OT Evaluation Precautions/Restrictions  Precautions Precautions: Fall Restrictions Weight Bearing Restrictions: No  Pain Pain Assessment Pain Assessment: No/denies pain Home Living/Prior  Functioning Home Living Available Help at Discharge: Family, Available PRN/intermittently Type of Home: House Home Access: Stairs to enter Technical brewer of Steps: 8 Entrance Stairs-Rails: Can reach both Home Layout: Two level, Able to live on main level with bedroom/bathroom Alternate Level Stairs-Number of Steps: usual main bedroom is on second level with flight of stairs Additional Comments: Has 2 daughters in [place] and [place]  Lives With: Spouse IADL History Current License: Yes Mode of Transportation: Car Occupation: On disability Prior Function Level of Independence: Independent with basic ADLs, Independent with gait, Independent with transfers  Able to Take Stairs?: Yes Driving: Yes Vocation: On disability 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 He performs housekeeping. Pt drives, performs light meal prep and self care independently.  Daughter in room and stated pt drove to her home in [place] and cared for her after and illness just 3 weeks ago. ADL  See FIM scale for details  Vision/Perception  Vision- History Baseline Vision/History: Wears glasses (Did not have during session pt reporting difficulty seeing the calendar) Wears Glasses: Distance only  Cognition Overall Cognitive Status: Impaired/Different from baseline Arousal/Alertness: Awake/alert Orientation Level: Person;Place Year: 2016 Month: July Day of Week: Correct Memory: Impaired Memory Impairment: Storage deficit;Decreased short term memory;Decreased recall of new information Decreased Short Term Memory: Verbal basic;Functional basic Immediate Memory Recall: Sock;Blue;Bed Memory Recall: Blue;Bed Attention: Sustained;Selective Sustained Attention: Appears intact Selective Attention: Impaired Selective Attention Impairment: Functional basic Awareness:  Impaired Awareness Impairment: Anticipatory impairment;Emergent impairment Problem Solving: Impaired Problem Solving Impairment: Functional basic Behaviors: Impulsive Safety/Judgment: Impaired Comments: Pt with multiple occasions of getting up without therapist in position beside of her.  When cued to wait and not stand she was unable to follow.  She also demonstrated decreased  awareness of her drain bag on multiple occasions.  Difficulty sequencing through use of hand held shower as well as remember parts of her body she had not washed yet.  Easily distracted with conversation during dressing tasks, was not aware that she had the right leg in the left leg hole of her pants.   Sensation Sensation Light Touch: Appears Intact Stereognosis: Appears Intact Hot/Cold: Appears Intact Proprioception: Appears Intact Coordination Gross Motor Movements are Fluid and Coordinated: Yes Fine Motor Movements are Fluid and Coordinated: Yes Heel Shin Test: WFL, slowed movement bilaterally Motor  Motor Motor: Within Functional Limits Mobility  Bed Mobility Bed Mobility: Supine to Sit Supine to Sit: 6: Modified independent (Device/Increase time);HOB flat;With rails Transfers Transfers: Sit to Stand;Stand to Sit Sit to Stand: 4: Min guard;From bed;From toilet;With upper extremity assist Stand to Sit: 4: Min guard;To toilet;With upper extremity assist  Trunk/Postural Assessment  Cervical Assessment Cervical Assessment: Within Functional Limits Thoracic Assessment Thoracic Assessment: Within Functional Limits Lumbar Assessment Lumbar Assessment: Within Functional Limits Postural Control Postural Control: Within Functional Limits  Balance Balance Balance Assessed: Yes Dynamic Sitting Balance Dynamic Sitting - Balance Support: No upper extremity supported Dynamic Sitting - Level of Assistance: 5: Stand by assistance Static Standing Balance Static Standing - Balance Support: No upper extremity  supported Static Standing - Level of Assistance: 5: Stand by assistance Dynamic Standing Balance Dynamic Standing - Balance Support: No upper extremity supported (Pt with one LOB when turning around during transfer from the walk-in shower, requring min assist to correct) Dynamic Standing - Level of Assistance: 4: Min assist Extremity/Trunk Assessment RUE Assessment RUE Assessment: Within Functional Limits (generalized weakness) LUE Assessment LUE Assessment: Within Functional Limits (generalized weakness)  FIM:  FIM - Eating Eating Activity: 5: Supervision/cues FIM - Grooming Grooming Steps: Wash, rinse, dry face;Wash, rinse, dry hands Grooming: 4: Patient completes 3 of 4 or 4 of 5 steps FIM - Bathing Bathing Steps Patient Completed: Chest;Right Arm;Left Arm;Abdomen;Front perineal area;Buttocks;Right upper leg;Left upper leg;Right lower leg (including foot);Left lower leg (including foot) Bathing: 4: Steadying assist FIM - Upper Body Dressing/Undressing Upper body dressing/undressing steps patient completed: Thread/unthread right bra strap;Thread/unthread left bra strap;Thread/unthread right sleeve of pullover shirt/dresss;Thread/unthread left sleeve of pullover shirt/dress;Put head through opening of pull over shirt/dress;Pull shirt over trunk Upper body dressing/undressing: 4: Min-Patient completed 75 plus % of tasks FIM - Lower Body Dressing/Undressing Lower body dressing/undressing steps patient completed: Thread/unthread right underwear leg;Thread/unthread left underwear leg;Pull underwear up/down;Thread/unthread left pants leg;Pull pants up/down;Thread/unthread right pants leg Lower body dressing/undressing: 3: Mod-Patient completed 50-74% of tasks FIM - Toileting Toileting steps completed by patient: Adjust clothing prior to toileting;Performs perineal hygiene;Adjust clothing after toileting Toileting: 4: Steadying assist FIM - Bed/Chair Transfer Bed/Chair Transfer: 5: Supine >  Sit: Supervision (verbal cues/safety issues);4: Bed > Chair or W/C: Min A (steadying Pt. > 75%) FIM - Radio producer Devices: Grab bars Toilet Transfers: 4-To toilet/BSC: Min A (steadying Pt. > 75%);4-From toilet/BSC: Min A (steadying Pt. > 75%) FIM - Tub/Shower Transfers Tub/Shower Assistive Devices: Tub transfer bench;Walk in shower Tub/shower Transfers: 4-Out of Tub/Shower: Min A (steadying Pt. > 75%/lift 1 leg);4-Into Tub/Shower: Min A (steadying Pt. > 75%/lift 1 leg)   Refer to Care Plan for Long Term Goals  Recommendations for other services: None  Discharge Criteria: Patient will be discharged from OT if patient refuses treatment 3 consecutive times without medical reason, if treatment goals not met, if there is a change in medical status, if patient makes  no progress towards goals or if patient is discharged from hospital.  The above assessment, treatment plan, treatment alternatives and goals were discussed and mutually agreed upon: by patient  Kenna Seward OTR/L 01/17/2015, 10:35 AM

## 2015-01-17 NOTE — Progress Notes (Signed)
Referring Physician(s): Manny  Subjective:  L PCN placed 7/1 Better daily In Rehab Plan to dc soon   Allergies: Review of patient's allergies indicates no known allergies.  Medications: Prior to Admission medications   Medication Sig Start Date End Date Taking? Authorizing Provider  amLODipine (NORVASC) 5 MG tablet Take 5 mg by mouth daily.    Historical Provider, MD  aspirin 81 MG tablet Take 81 mg by mouth daily.    Historical Provider, MD  atorvastatin (LIPITOR) 40 MG tablet Take 40 mg by mouth daily.    Historical Provider, MD  CALCIUM-VITAMIN D PO Take 2 tablets by mouth daily.    Historical Provider, MD  carbidopa-levodopa (SINEMET IR) 25-100 MG per tablet Take 2 tablets by mouth 3 (three) times daily.    Historical Provider, MD  cefTRIAXone (ROCEPHIN) 40 MG/ML IVPB Inject 50 mLs (2 g total) into the vein daily. 01/16/15   Allie Bossier, MD  clonazePAM (KLONOPIN) 0.5 MG tablet Take 0.5 mg by mouth at bedtime.    Historical Provider, MD  fish oil-omega-3 fatty acids 1000 MG capsule Take 3 g by mouth daily.    Historical Provider, MD  HYDROcodone-acetaminophen (NORCO) 5-325 MG per tablet Take 1-2 tablets by mouth every 6 (six) hours as needed for moderate pain or severe pain. Patient not taking: Reported on 01/09/2015 08/28/14   [name], MD  hydrocortisone sodium succinate (SOLU-CORTEF) 100 MG SOLR injection Inject 0.5 mLs (25 mg total) into the vein every 12 (twelve) hours. 01/17/15   Allie Bossier, MD  Multiple Vitamins-Minerals (MULTIVITAMINS THER. W/MINERALS) TABS Take 1 tablet by mouth daily.    Historical Provider, MD  potassium chloride SA (K-DUR,KLOR-CON) 20 MEQ tablet Take 2 tablets (40 mEq total) by mouth daily. 01/16/15   Allie Bossier, MD  TRIHEXYPHENIDYL HCL PO Take 1 tablet by mouth 2 (two) times daily.    Historical Provider, MD     Vital Signs: BP 109/66 mmHg  Pulse 53  Temp(Src) 97.6 F (36.4 C) (Oral)  Resp 16  Ht 5\' 2"  (1.575 m)  SpO2  99%  Physical Exam  Skin: Skin is warm and dry.  Left skin site NT No bleeding Clean and dry Output 625 cc clear yellow Bun/Cr 58/1.42  Nursing note and vitals reviewed.   Imaging: Dg Chest Port 1 View  01/15/2015   CLINICAL DATA:  Shortness of Breath  EXAM: PORTABLE CHEST - 1 VIEW  COMPARISON:  January 13, 2015  FINDINGS: There has been considerable clearing of interstitial edema compared to 2 days prior. There remains left lower lobe consolidation with left effusion. There is a smaller right effusion. There is atelectatic change in the right mid lung. Heart is upper normal in size with pulmonary vascularity within normal limits. No adenopathy.  IMPRESSION: Considerable clearing of interstitial edema compared to recent prior study. Persistent left lower lobe consolidation with small left effusion. A smaller right effusion is also present. Right midlung atelectatic change present.   Electronically Signed   By: Lowella Grip III M.D.   On: 01/15/2015 07:14   Dg Abd Portable 1v  01/14/2015   CLINICAL DATA:  Pyelonephritis.  Nephrostomy.  EXAM: PORTABLE ABDOMEN - 1 VIEW  COMPARISON:  None.  FINDINGS: A left nephrostomy tube is noted with a single well in the end of the tube in the expected vicinity of the left collecting system.  There is a potential calcification projecting over the more medial portion of the coil, measuring up to  6 mm in long axis.  Bowel gas pattern unremarkable.  IMPRESSION: 1. Left nephrostomy tube with single cul well in the expected position of the left collecting system. A calculus projects over the medial margin of the loop.   Electronically Signed   By: Van Clines M.D.   On: 01/14/2015 19:29    Labs:  CBC:  Recent Labs  01/14/15 0245 01/15/15 0317 01/16/15 0216 01/17/15 0420  WBC 55.3* 55.6* 43.8* 39.8*  HGB 13.2 12.3 11.9* 11.3*  HCT 37.8 35.5* 35.0* 33.0*  PLT 29* 53* 76* 120*    COAGS:  Recent Labs  01/10/15 0050 01/12/15 1146  INR 2.22* 1.36     BMP:  Recent Labs  01/15/15 0317 01/15/15 1656 01/16/15 0216 01/17/15 0420  NA 143 142 143 142  K 2.8* 3.0* 3.3* 3.8  CL 107 107 110 114*  CO2 25 23 24 23   GLUCOSE 119* 123* 103* 131*  BUN 94* 86* 78* 58*  CALCIUM 7.8* 8.0* 7.6* 7.7*  CREATININE 2.80* 2.38* 2.03* 1.42*  GFRNONAA 17* 20* 25* 38*  GFRAA 19* 24* 29* 44*    LIVER FUNCTION TESTS:  Recent Labs  01/13/15 0400 01/15/15 1656 01/16/15 0216 01/17/15 0420  BILITOT 1.2 1.0 0.8 0.5  AST 38 58* 38 32  ALT 8* 21 8* 10*  ALKPHOS 231* 247* 206* 171*  PROT 4.8* 5.0* 4.6* 4.3*  ALBUMIN 2.0* 2.4* 2.1* 2.1*    Assessment and Plan:  L PCN placed 7/1 Intact followed with [facility]  Signed: Samah Lapiana A 01/17/2015, 10:29 AM   I spent a total of 15 Minutes in face to face in clinical consultation/evaluation, greater than 50% of which was counseling/coordinating care for L PCN

## 2015-01-17 NOTE — Progress Notes (Signed)
Nutrition Brief Note  Patient identified on the Malnutrition Screening Tool (MST) Report  Wt Readings from Last 15 Encounters:  01/16/15 130 lb 8.2 oz (59.2 kg)  08/22/14 113 lb (51.256 kg)    Body Mass Index: 24 kg/(m^2) Patient meets criteria for normal based on current BMI.   Current diet order is regular, patient is consuming approximately 100% of meals at this time. Appetite reported to be good with with no other difficulties. Pt currently has Prostat ordered and would like to continue with them. RD to modify orders to BID as pt's protein needs are not abundant. Labs and medications reviewed.   No nutrition interventions warranted at this time. If nutrition issues arise, please consult RD.   Becky Parker, MS, RD, LDN Pager # [phone] After hours/ weekend pager # [phone]

## 2015-01-17 NOTE — Evaluation (Signed)
Physical Therapy Assessment and Plan  Patient Details  Name: Becky Rogers MRN: [id number] Date of Birth: [birth year]  PT Diagnosis: Abnormality of gait, Impaired cognition and Muscle weakness, Decreased standing balance Rehab Potential: Good ELOS: 7-10 days   Today's Date: 01/17/2015 PT Individual Time: 0905-1005 PT Individual Time Calculation (min): 60 min    Problem List:  Patient Active Problem List   Diagnosis Date Noted  . Debility 01/16/2015  . Acute renal failure syndrome   . E. coli UTI   . Leukocytosis   . 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 Thrombocytopenia   . Metabolic encephalopathy   . Acute respiratory failure with hypoxia   . Parkinson's disease   . Altered mental status   . Encounter for central line placement   . Pulmonary infiltrates   . Abdominal pain, lower   . Hypoxia   . Pyelonephritis   . Pyonephrosis   . Septic shock 01/09/2015  . Abnormal mammogram of right breast 08/28/2014    Past Medical History:  Past Medical History  Diagnosis Date  . Hypertension   . Hypercholesteremia   . Parkinson disease   . Breast cancer     right   Past Surgical History:  Past Surgical History  Procedure Laterality Date  . Cesarean section      x2  . Ovarian cyst removal      age 15  . Appendectomy    . Colonoscopy    . Dental surgery      implant  . Breast lumpectomy with radioactive seed localization Right 08/28/2014    Procedure: RIGHT BREAST LUMPECTOMY WITH RADIOACTIVE SEED LOCALIZATION;  Surgeon: [name], MD;  Location: [facility];  Service: General;  Laterality: Right;    Assessment & Plan Clinical Impression: Becky Rogers is a 64 y.o. female with history of Parkinson's disease with mild dementia, breast cancer, HTN who was admitted 01/09/15 with abdominal pain, acute renal failure and hypotension due to urosepsis. She was started on pressors, solucortef and fluids as well as broad spectrum antibiotics for septic shock.  CT revealed left ureteral stone with concerns for obstructed pyonephrosis and underwent left percutaneous nephrostomy by interventional radiology. Dr. Alinda Money recommended 14 days of culture specific antibiotics prior to any definitive stone procedure and follow up with Dr. Tresa Moore on outpatient basis. Antibiotics changed to ceftriaxone for pansensitive E coli bacteremia. Mentation is improving. Acute renal failure slowly improving and thrombocytopenia/coagulopathy resolving. Ischemic injury to bilateral finger and toes/feet being monitored for demarcation/gangrenous changes. Swallow evaluation revealed mild cognitively based dysphagia and was placed on esophageal/aspiration precautions. Patient transferred to CIR on 01/16/2015.   Patient currently requires min with mobility secondary to muscle weakness, decreased cardiorespiratoy endurance and decreased attention, decreased awareness, decreased problem solving, decreased safety awareness and decreased memory.  Prior to hospitalization, patient was independent  with mobility and lived with Spouse in a House home.  Home access is 8Stairs to enter.  Patient will benefit from skilled PT intervention to maximize safe functional mobility, minimize fall risk and decrease caregiver burden for planned discharge home with 24 hour supervision due to cognitive deficits.  Anticipate patient will benefit from follow up OP at discharge.  PT - End of Session Activity Tolerance: Tolerates 30+ min activity with multiple rests Endurance Deficit: Yes Endurance Deficit Description: seated rest with functional mobility PT Assessment Rehab Potential (ACUTE/IP ONLY): Good Barriers to Discharge: Decreased caregiver support PT Patient demonstrates impairments in the following area(s): Balance;Behavior;Endurance;Motor;Nutrition;Pain;Safety PT  Transfers Functional Problem(s): Bed Mobility;Bed to Chair;Car;Furniture;Floor PT Locomotion Functional Problem(s): Stairs;Ambulation PT  Plan PT Intensity: Minimum of 1-2 x/day ,45 to 90 minutes PT Frequency: 5 out of 7 days PT Duration Estimated Length of Stay: 7-10 days PT Treatment/Interventions: Ambulation/gait training;Balance/vestibular training;Cognitive remediation/compensation;Community reintegration;Discharge planning;Disease management/prevention;DME/adaptive equipment instruction;Functional mobility training;Neuromuscular re-education;Pain management;Patient/family education;Psychosocial support;Stair training;Therapeutic Activities;Therapeutic Exercise;UE/LE Strength taining/ROM;UE/LE Coordination activities PT Transfers Anticipated Outcome(s): mod I PT Locomotion Anticipated Outcome(s): supervision PT Recommendation Recommendations for Other Services: Neuropsych consult Follow Up Recommendations: 24 hour supervision/assistance;Outpatient PT Patient destination: Home Equipment Recommended: To be determined  Skilled Therapeutic Intervention Skilled therapeutic intervention initiated after completion of evaluation. Discussed with patient falls risk, safety within room, and focus of therapy during stay. Discussed possible LOS, goals, and f/u therapy. Patient currently min guard/A overall without device. Performed 5TSS without UE support = 13 sec. Patient demonstrates decreased safety awareness evidenced by attempting to stand unsupervised with only one shoe on and regular sock on other foot. Currently recommend 24/7 supervision upon discharge due to cognitive deficits. Patient left sitting in recliner with BLE elevated and tray table in front of patient. Patient verbalized and demonstrated how to utilize call bell for assistance, RN Heather Roberts notified of need for quick release belt and patient position as therapist unable to locate belt on rehab unit.   PT Evaluation Precautions/Restrictions Precautions Precautions: Fall Restrictions Weight Bearing Restrictions: No General Chart Reviewed: Yes Family/Caregiver  Present: No  Vital Signs 135/61, HR 57, Sp02 98% RA Pain Pain Assessment Pain Assessment: No/denies pain Home Living/Prior Functioning Home Living Available Help at Discharge: Family;Available PRN/intermittently Type of Home: House Home Access: Stairs to enter [employer] of Steps: 8 Entrance Stairs-Rails: Can reach both Home Layout: Two level;Able to live on main level with bedroom/bathroom Alternate Level Stairs-Number of Steps: usual main bedroom is on second level with flight of stairs Additional Comments: Has 2 daughters in [place] and [place]  Lives With: Spouse Prior Function Level of Independence: Independent with basic ADLs;Independent with gait;Independent with transfers  Able to Take Stairs?: Yes Driving: Yes Vocation: On disability 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 He performs housekeeping. Pt drives, performs light meal prep and self care independently.  Daughter in room and stated pt drove to her home in [place] and cared for her after and illness just 3 weeks ago. Cognition Overall Cognitive Status: Impaired/Different from baseline Arousal/Alertness: Awake/alert Attention: Sustained;Selective Sustained Attention: Appears intact Selective Attention: Impaired Selective Attention Impairment: Functional basic Memory: Impaired Memory Impairment: Storage deficit;Decreased short term memory;Decreased recall of new information Decreased Short Term Memory: Verbal basic;Functional basic Awareness: Impaired Awareness Impairment: Anticipatory impairment;Emergent impairment Problem Solving: Impaired Problem Solving Impairment: Functional basic Behaviors: Impulsive Safety/Judgment: Impaired Comments: Pt with multiple occasions of getting up without therapist in position beside of her.  When cued to wait and not stand  she was unable to follow.  She also demonstrated decreased awareness of her drain bag on multiple occasions.  Difficulty sequencing through use of hand held shower as well as remember parts of her body she had not washed yet.  Easily distracted with conversation during dressing tasks, was not aware that she had the right leg in the left leg hole of her pants.   Sensation Sensation Light Touch: Appears Intact Stereognosis: Appears Intact Hot/Cold: Appears Intact Proprioception: Appears Intact Coordination Gross Motor Movements are Fluid and Coordinated: Yes Fine Motor Movements  are Fluid and Coordinated: Yes Heel Shin Test: WFL, slowed movement bilaterally Motor  Motor Motor: Within Functional Limits  Mobility Bed Mobility Bed Mobility: Supine to Sit Supine to Sit: HOB flat;5: Supervision Transfers Transfers: Yes Sit to Stand: 4: Min guard;From bed;With upper extremity assist;Without upper extremity assist;From chair/3-in-1 Stand to Sit: 4: Min guard;With upper extremity assist;To chair/3-in-1;To bed;Without upper extremity assist Locomotion  Ambulation Ambulation: Yes Ambulation/Gait Assistance: 4: Min guard;4: Min assist Ambulation Distance (Feet): 250 Feet Assistive device: None Ambulation/Gait Assistance Details: Verbal cues for precautions/safety Gait Gait: Yes Gait Pattern: Impaired Gait Pattern: Step-through pattern;Decreased stride length;Narrow base of support;Decreased trunk rotation;Trunk flexed Gait velocity: 10 MWT with S = 0.83 m/s Stairs / Additional Locomotion Stairs: Yes Stairs Assistance: 4: Min assist;4: Min guard Stairs Assistance Details: Verbal cues for sequencing;Verbal cues for precautions/safety;Verbal cues for technique Stairs Assistance Details (indicate cue type and reason): cues to decrease pace and maintain entire foot on stair before stepping Stair Management Technique: Two rails;Alternating pattern;Forwards Number of Stairs: 12 Height of Stairs:  6 Ramp: Not tested (comment) Curb: Not tested (comment) Wheelchair Mobility Wheelchair Mobility: No (pt ambulatory)  Trunk/Postural Assessment  Cervical Assessment Cervical Assessment: Within Functional Limits Thoracic Assessment Thoracic Assessment: Within Functional Limits Lumbar Assessment Lumbar Assessment: Within Functional Limits Postural Control Postural Control: Deficits on evaluation Protective Responses: impaired/delayed  Balance Balance Balance Assessed: Yes Standardized Balance Assessment Standardized Balance Assessment: Timed Up and Go Test Timed Up and Go Test TUG: Normal TUG;Cognitive TUG Normal TUG (seconds): 17 (no AD with S-min guard) Cognitive TUG (seconds): 31 (no AD with S-min guard) Dynamic Sitting Balance Dynamic Sitting - Balance Support: No upper extremity supported Dynamic Sitting - Level of Assistance: 5: Stand by assistance Static Standing Balance Static Standing - Balance Support: No upper extremity supported;During functional activity Static Standing - Level of Assistance: 5: Stand by assistance;4: Min assist Dynamic Standing Balance Dynamic Standing - Balance Support: During functional activity;No upper extremity supported Dynamic Standing - Level of Assistance: 4: Min assist Extremity Assessment  RUE Assessment RUE Assessment: Within Functional Limits (generalized weakness) LUE Assessment LUE Assessment: Within Functional Limits (generalized weakness) RLE Assessment RLE Assessment: Within Functional Limits (grossly 5/5 throughout except 4/5 knee ext and hip flex) LLE Assessment LLE Assessment: Within Functional Limits (grossly 5/5 throughout except 4/5 hip flexion)  FIM:  FIM - Bed/Chair Transfer Bed/Chair Transfer: 5: Supine > Sit: Supervision (verbal cues/safety issues);4: Bed > Chair or W/C: Min A (steadying Pt. > 75%) FIM - Locomotion: Wheelchair Locomotion: Wheelchair: 0: Activity did not occur (pt ambulatory) FIM - Locomotion:  Ambulation Locomotion: Ambulation Assistive Devices: Other (comment) (no AD) Ambulation/Gait Assistance: 4: Min guard;4: Min assist Locomotion: Ambulation: 4: Travels 150 ft or more with minimal assistance (Pt.>75%) FIM - Locomotion: Stairs Locomotion: Scientist, physiological: Hand rail - 2 Locomotion: Stairs: 4: Up and Down 12 - 14 stairs with minimal assistance (Pt.>75%)   Refer to Care Plan for Long Term Goals  Recommendations for other services: Neuropsych  Discharge Criteria: Patient will be discharged from PT if patient refuses treatment 3 consecutive times without medical reason, if treatment goals not met, if there is a change in medical status, if patient makes no progress towards goals or if patient is discharged from hospital.  The above assessment, treatment plan, treatment alternatives and goals were discussed and mutually agreed upon: by patient  Laretta Alstrom 01/17/2015, 10:53 AM

## 2015-01-17 NOTE — Progress Notes (Signed)
Patient information reviewed and entered into eRehab system by Ayven Glasco, RN, CRRN, PPS Coordinator.  Information including medical coding and functional independence measure will be reviewed and updated through discharge.    

## 2015-01-18 ENCOUNTER — Inpatient Hospital Stay (HOSPITAL_COMMUNITY): Payer: BLUE CROSS/BLUE SHIELD | Admitting: *Deleted

## 2015-01-18 ENCOUNTER — Inpatient Hospital Stay (HOSPITAL_COMMUNITY): Payer: BLUE CROSS/BLUE SHIELD | Admitting: Occupational Therapy

## 2015-01-18 ENCOUNTER — Inpatient Hospital Stay (HOSPITAL_COMMUNITY): Payer: BLUE CROSS/BLUE SHIELD | Admitting: Physical Therapy

## 2015-01-18 ENCOUNTER — Inpatient Hospital Stay (HOSPITAL_COMMUNITY): Payer: BLUE CROSS/BLUE SHIELD | Admitting: Speech Pathology

## 2015-01-18 LAB — CBC
HCT: 31.8 % — ABNORMAL LOW (ref 36.0–46.0)
HEMOGLOBIN: 10.7 g/dL — AB (ref 12.0–15.0)
MCH: 29.2 pg (ref 26.0–34.0)
MCHC: 33.6 g/dL (ref 30.0–36.0)
MCV: 86.9 fL (ref 78.0–100.0)
PLATELETS: 148 10*3/uL — AB (ref 150–400)
RBC: 3.66 MIL/uL — ABNORMAL LOW (ref 3.87–5.11)
RDW: 14.5 % (ref 11.5–15.5)
WBC: 30.1 10*3/uL — ABNORMAL HIGH (ref 4.0–10.5)

## 2015-01-18 NOTE — Plan of Care (Signed)
Problem: RH BLADDER ELIMINATION Goal: RH STG MANAGE BLADDER WITH ASSISTANCE STG Manage Bladder With Assistance. Mod I  Outcome: Not Progressing Pt has nephrostomy tube

## 2015-01-18 NOTE — Progress Notes (Signed)
Physical Therapy Session Note  Patient Details  Name: KALAH PFLUM MRN: [id number] Date of Birth: 04-13-1951  Today's Date: 01/18/2015 PT Individual Time: 1105-1205 PT Individual Time Calculation (min): 60 min   Short Term Goals: Week 1:  PT Short Term Goal 1 (Week 1): = LTGs due to anticipated LOS  Skilled Therapeutic Interventions/Progress Updates:   Pt most limited by decreased attention and safety awareness throughout session. Pt able to tolerate higher intensity activities with rest breaks for fatigue. Pt with some difficulty coordinating higher velocity activities, which are important to prevent falls and reduce injury associated with falls. Pt would continue to benefit from skilled PT services to increase functional mobility.   Therapy Documentation Precautions:  Precautions Precautions: Fall Restrictions Weight Bearing Restrictions: No Pain: Pain Assessment Pain Assessment: No/denies pain Mobility:  Min Guard  Locomotion : Ambulation Ambulation/Gait Assistance: 4: Min guard  Other Treatments:  Pt perform with hockey stick power pulls, modified deadlifts with neutral spine,  Military press, push press, mid level power cleans, mid level power pulls 2x10 each with cues for velocity and technique. Pt educated on rehab plan, safety in mobility, and fall prevention. Static standing balance 1'x2. Pt performs dynamic standing with dual motor tasks including weight shifting, reaching, and grasping within functional context. Pt behavior managed with redirection, education, and quiet rest breaks as needed.  See FIM for current functional status  Therapy/Group: Individual Therapy  Monia Pouch 01/18/2015, 11:30 AM

## 2015-01-18 NOTE — Progress Notes (Signed)
[facility] PHYSICAL MEDICINE & REHABILITATION     PROGRESS NOTE    Subjective/Complaints: Restless again last night. Still with some confusion  ROS: Pt denies fever, rash/itching, headache, blurred or double vision, nausea, vomiting, abdominal pain, diarrhea, chest pain, shortness of breath, palpitations, dysuria, dizziness, neck or back pain, bleeding, anxiety, or depression   Objective: Vital Signs: Blood pressure 132/68, pulse 56, temperature 98.4 F (36.9 C), temperature source Oral, resp. rate 47, height 5\' 2"  (1.575 m), SpO2 96 %. No results found.  Recent Labs  01/17/15 0420 01/18/15 0502  WBC 39.8* 30.1*  HGB 11.3* 10.7*  HCT 33.0* 31.8*  PLT 120* 148*    Recent Labs  01/16/15 0216 01/17/15 0420  NA 143 142  K 3.3* 3.8  CL 110 114*  GLUCOSE 103* 131*  BUN 78* 58*  CREATININE 2.03* 1.42*  CALCIUM 7.6* 7.7*   CBG (last 3)  No results for input(s): GLUCAP in the last 72 hours.  Wt Readings from Last 3 Encounters:  01/16/15 59.2 kg (130 lb 8.2 oz)  08/22/14 51.256 kg (113 lb)    Physical Exam:  Constitutional: She is oriented to person, place, and time. She appears well-developed and well-nourished.  HENT:  Head: Normocephalic and atraumatic.  Right Ear: External ear normal.  Left Ear: External ear normal.  Mouth/Throat: Oropharynx is clear and moist.  Eyes: Conjunctivae are normal. Pupils are equal, round, and reactive to light.  Neck: Normal range of motion. Neck supple. No JVD present. No tracheal deviation present. No thyromegaly present.  Cardiovascular: Normal rate and regular rhythm. Exam reveals no friction rub.  No murmur heard. Respiratory: Effort normal. No respiratory distress. She has decreased breath sounds in the right lower field and the left lower field. She has no wheezes. She exhibits no tenderness.  GI: Soft. Bowel sounds are normal. She exhibits no distension. There is no tenderness. There is no rebound.  Genitourinary:  Left  nephrostomy draining   yellow urine.  Musculoskeletal: She exhibits edema.  Decrease in edema LUE. Neurological: She is alert and oriented to person, place, and time. No cranial nerve deficit. Coordination normal.  Oriented to situation with more cues today. Language nonsensical today. Able to follow basic one step commands without difficulty. UE's 3+ prox to 4/5 distally. LE's 2+hf, 3/5 ke and 3+ ankles. Senses pain and LT in both legs. No rigidity or tremor. Masked facies. Delayed processing. No sensory deficits  Skin: Skin is warm and dry.  Ischemic areas distal fingers and toes. Toes more affected. Bilateral 3rd, 4th and 5th toes with some breakdown in webspaces. No gangrene but left 2nd toe tip still dusky---does seem to have blood flow however.. Non-tender to touch.  Psychiatric: She has a normal mood and affect. Her behavior is normal.   Assessment/Plan: 1. Functional deficits secondary to debility/encephalopathy after septic shock/prolonged hospital course which require 3+ hours per day of interdisciplinary therapy in a comprehensive inpatient rehab setting. Physiatrist is providing close team supervision and 24 hour management of active medical problems listed below. Physiatrist and rehab team continue to assess barriers to discharge/monitor patient progress toward functional and medical goals. FIM: FIM - Bathing Bathing Steps Patient Completed: Chest, Right Arm, Left Arm, Abdomen, Front perineal area, Buttocks, Right upper leg, Left upper leg, Right lower leg (including foot), Left lower leg (including foot) Bathing: 4: Steadying assist  FIM - Upper Body Dressing/Undressing Upper body dressing/undressing steps patient completed: Thread/unthread right bra strap, Thread/unthread left bra strap, Thread/unthread right sleeve of  pullover shirt/dresss, Thread/unthread left sleeve of pullover shirt/dress, Put head through opening of pull over shirt/dress, Pull shirt over trunk Upper body  dressing/undressing: 4: Min-Patient completed 75 plus % of tasks FIM - Lower Body Dressing/Undressing Lower body dressing/undressing steps patient completed: Thread/unthread right underwear leg, Thread/unthread left underwear leg, Pull underwear up/down, Thread/unthread left pants leg, Pull pants up/down, Thread/unthread right pants leg Lower body dressing/undressing: 3: Mod-Patient completed 50-74% of tasks  FIM - Toileting Toileting steps completed by patient: Adjust clothing prior to toileting, Performs perineal hygiene, Adjust clothing after toileting Toileting Assistive Devices: Grab bar or rail for support Toileting: 4: Steadying assist  FIM - Radio producer Devices: Grab bars Toilet Transfers: 4-To toilet/BSC: Min A (steadying Pt. > 75%), 4-From toilet/BSC: Min A (steadying Pt. > 75%)  FIM - Bed/Chair Transfer Bed/Chair Transfer: 5: Supine > Sit: Supervision (verbal cues/safety issues), 4: Bed > Chair or W/C: Min A (steadying Pt. > 75%)  FIM - Locomotion: Wheelchair Locomotion: Wheelchair: 0: Activity did not occur (pt ambulatory) FIM - Locomotion: Ambulation Locomotion: Ambulation Assistive Devices: Other (comment) (no AD) Ambulation/Gait Assistance: 4: Min guard, 4: Min assist Locomotion: Ambulation: 4: Travels 150 ft or more with minimal assistance (Pt.>75%)  Comprehension Comprehension Mode: Auditory Comprehension: 4-Understands basic 75 - 89% of the time/requires cueing 10 - 24% of the time  Expression Expression Mode: Verbal Expression: 4-Expresses basic 75 - 89% of the time/requires cueing 10 - 24% of the time. Needs helper to occlude trach/needs to repeat words.  Social Interaction Social Interaction: 5-Interacts appropriately 90% of the time - Needs monitoring or encouragement for participation or interaction.  Problem Solving Problem Solving: 3-Solves basic 50 - 74% of the time/requires cueing 25 - 49% of the time  Memory Memory:  3-Recognizes or recalls 50 - 74% of the time/requires cueing 25 - 49% of the time  Medical Problem List and Plan: 1. Functional deficits secondary to Encephalopathy and debility after sepsis 2. DVT Prophylaxis/Anticoagulation: Mechanical: Sequential compression devices, below knee Bilateral lower extremities 3. Pain Management: Tylenol prn for pain 4. Mood: LCSW to follow with patient and husband for support. 5. Neuropsych: This patient is not capable of making decisions on her own behalf.  -may be wax/wane of mental status----follow for pattern, clinically improving otherwise 6. Skin/Wound Care: Routine pressure relief measures. Encourage appropriate nutrition  -padding between toes 7. Fluids/Electrolytes/Nutrition: Monitor I/O. Offer supplements between meals.  8. Left ureteral stone: Left nephrostomy tube in place with good UOP. 9. AKI: Resolving post L-nephrostomy tube. BUN/CR continue to fall. 10 Urosepsis due to E coli bacteremia/UTI: Continue antibiotics D # 9/ 14 days.  11. Hypokalemia: continue to replace with supplement 12. Leucocytosis: trending down,  -steroid effect  -count 30k today   -watch hgb also  -daily cbc 13. Parkinson's disease: On Sinemet 50/200 tid and artane bid  14. Acute respiratory failure: Resolved. Lasix on hold due to AKI. Encourage IS.  15. Insomnia: klonopin.  16. Thrombocytopenia: platelets recovering---148k today  LOS (Days) 2 A FACE TO FACE EVALUATION WAS PERFORMED  Emiyah Spraggins T 01/18/2015 8:32 AM

## 2015-01-18 NOTE — Progress Notes (Signed)
Occupational Therapy Session Note  Patient Details  Name: Becky Rogers MRN: [id number] Date of Birth: [birth year]  Today's Date: 01/18/2015 OT Individual Time: 1000-1100 OT Individual Time Calculation (min): 60 min    Short Term Goals: Week 1:  OT Short Term Goal 1 (Week 1): STGs equal to LTGs based on ELOS.  Skilled Therapeutic Interventions/Progress Updates:    Pt seen this session for  BADL retraining with a focus on attention, sequencing, problem solving, orientation. Pt's husband present at start of session and he expressed that his greatest concern was his wife's cognitive changes since her illness.  Discussed orientation strategies, using a wall calendar, watch, writing out the day she came to the hospital, the day she was admitted to rehab, etc to allow her to have a visual picture of what has occurred and when.  Pt's husband left and ADL routine initiated for toileting, shower and dressing.  Overall, pt only requires steadying A for most of the self care (A pt with socks, shoes, tying shoes) but she requires a great deal of cuing. Pt waxed and waned this session as to her orientation about what we were doing, where she was. She thought she did not need to bother with a shower since she might get in the pool at her house later today.  She was reoriented to situation with only 2 cues.  During self care, pt demonstrated L/R confusion and even body inattention. She did not realize that she could use her L hand to pull her pants up or she was having difficulty figuring out how to orient her body on tub bench. Pt fully aware that she is having this confusion. Discussed slowing down, giving herself time to process information.  Overall, pt participated well with good activity tolerance. Her PT arrived for her next session.    Therapy Documentation Precautions:  Precautions Precautions: Fall Restrictions Weight Bearing Restrictions: No  Pain: Pain Assessment Pain Assessment: No/denies  pain ADL:  See FIM for current functional status  Therapy/Group: Individual Therapy  [place] 01/18/2015, 2:21 PM

## 2015-01-18 NOTE — Progress Notes (Signed)
Physical Therapy Session Note  Patient Details  Name: Becky Rogers MRN: [id number] Date of Birth: 1951/06/16  Today's Date: 01/18/2015 PT Individual Time: 1445-1540 PT Individual Time Calculation (min): 55 min    Skilled Therapeutic Interventions/Progress Updates:  Patient in room ,sitting in recliner with QR belt on.Agrees to therapy, no complains of pain or discomfort.  Patient able to ambulate to gym with occasional cues for focus ,no LOb notedNuStep 1 x 9 min with resistance level of 4.0 ,in order to improve strength and coordination as well as to increase reciprocal movements during gait.  Balance training in standing with weight shifting and rapid turns R and L while holding 2 lbs weights in B UE, cues for turning and gaze following. Alternating movements of UE and LE in order to facilitate continuous reciprocal and fluid gait. Tandem walking and grapevine walking to increase balance and reduce risk for falls.  Auditory and visual conflict exercise introduced in order to improve focus and improve reaction time. Patient asked to pick a certain color cone , but a different cone placed in front -patient with about 40% accuracy of task. Patient also asked to touch R or L hand of therapist , but opposite hand reached out , patient to follow auditory cues -50 % accuracy.  Patient returned to room ,left in a recliner with QR belt on and all needs with in reach.   Therapy Documentation Precautions:  Precautions Precautions: Fall Restrictions Weight Bearing Restrictions: No Vital Signs: Therapy Vitals Temp: 98 F (36.7 C) Temp Source: Oral Pulse Rate: 70 Resp: 18 BP: (!) 144/64 mmHg Patient Position (if appropriate): Sitting Oxygen Therapy SpO2: 96 % Pain: Pain Assessment Pain Assessment: No/denies pain  See FIM for current functional status  Therapy/Group: Individual Therapy  Guadlupe Spanish 01/18/2015, 4:45 PM

## 2015-01-18 NOTE — Progress Notes (Signed)
Speech Language Pathology Daily Session Note  Patient Details  Name: Becky Rogers MRN: [id number] Date of Birth: May 05, 1951  Today's Date: 01/18/2015 SLP Individual Time: 3762-8315 SLP Individual Time Calculation (min): 45 min  Short Term Goals: Week 1: SLP Short Term Goal 1 (Week 1): Patient will utilize external memory aids for orientation and recall of daily activities with Min A question and verbal cues.  SLP Short Term Goal 2 (Week 1): Patient will demonstrate functional problem solving for basic and familiar tasks with Min A verbal cues.  SLP Short Term Goal 3 (Week 1): Patient will self-monitor and correct errors during functional tasks with Min A verbal and question cues. SLP Short Term Goal 4 (Week 1): Patient will demonstrate selective attention to a functional task for 30 minutes with Min A verbal cues for redirection.  SLP Short Term Goal 5 (Week 1): Patient will utilize call bell to request assistance in 75% of observable opportunities with Min A question cues.   Skilled Therapeutic Interventions: Skilled treatment session focused on cognitive goals. SLP facilitated session by providing Max A for recall of events from hospitalization and a visual aid was created to increase recall and orientation with information. SLP also facilitated session by providing extra time and Max-total A multimodal cues for functional problem solving and organization with a basic money counting task. Patient reported difficulty with task but was unaware of amount of assistance she required to complete task. Patient demonstrated increased safety awareness today and asked this clinician for permission prior to standing for transfer. Patient left upright in recliner with quick release belt in place and all needs within reach.  Continue with current plan of care.    FIM:  Comprehension Comprehension Mode: Auditory Comprehension: 4-Understands basic 75 - 89% of the time/requires cueing 10 - 24% of the  time Expression Expression Mode: Verbal Expression: 5-Expresses basic needs/ideas: With extra time/assistive device Social Interaction Social Interaction: 5-Interacts appropriately 90% of the time - Needs monitoring or encouragement for participation or interaction. Problem Solving Problem Solving: 2-Solves basic 25 - 49% of the time - needs direction more than half the time to initiate, plan or complete simple activities Memory Memory: 2-Recognizes or recalls 25 - 49% of the time/requires cueing 51 - 75% of the time  Pain Pain Assessment Pain Assessment: No/denies pain  Therapy/Group: Individual Therapy  Nahiem Dredge 01/18/2015, 4:37 PM

## 2015-01-18 NOTE — Progress Notes (Signed)
01/18/2015 1902 nsg RN was called to room re: edema lower extremities ;+1 edema noted; pt not in any form of respiratory distress; urinary output is good noted on chart. MD notified new orders noted.

## 2015-01-19 ENCOUNTER — Inpatient Hospital Stay (HOSPITAL_COMMUNITY): Payer: BLUE CROSS/BLUE SHIELD

## 2015-01-19 ENCOUNTER — Inpatient Hospital Stay (HOSPITAL_COMMUNITY): Payer: BLUE CROSS/BLUE SHIELD | Admitting: Occupational Therapy

## 2015-01-19 LAB — CBC
HEMATOCRIT: 30.8 % — AB (ref 36.0–46.0)
HEMOGLOBIN: 10.4 g/dL — AB (ref 12.0–15.0)
MCH: 29.7 pg (ref 26.0–34.0)
MCHC: 33.8 g/dL (ref 30.0–36.0)
MCV: 88 fL (ref 78.0–100.0)
Platelets: 184 10*3/uL (ref 150–400)
RBC: 3.5 MIL/uL — ABNORMAL LOW (ref 3.87–5.11)
RDW: 14.5 % (ref 11.5–15.5)
WBC: 20.8 10*3/uL — ABNORMAL HIGH (ref 4.0–10.5)

## 2015-01-19 NOTE — Progress Notes (Signed)
Occupational Therapy Session Note  Patient Details  Name: Becky Rogers MRN: [id number] Date of Birth: Apr 28, 1951  Today's Date: 01/19/2015 OT Individual Time: 1700-1800 OT Individual Time Calculation (min): 60 min    Short Term Goals: Week 1:  OT Short Term Goal 1 (Week 1): STGs equal to LTGs based on ELOS. Week 2:     Skilled Therapeutic Interventions/Progress Updates:    Provided skilled intervention for balance, mobility, following simple commands.  Pt. Sitting in recliner upon OT arrival.  Removed ted stockings and donned shoes and socks with increased time and SBA.  Ted stockings making creases around legs so removed awhile.  Pt's husband present.  Ambulated down to gym with husband following with wc for safety.  Practiced Tai chi walking with heel toe stride and slight bent knees.  Provided max cues for following directions.  Instructed pt on balance on knees with PNF isometric resistance.  Instructed on ant/post pelvic tilt, and lateral WS for reaching.  Ambulated back to room and left in recliner with legs elevated.   Therapy Documentation Precautions:  Precautions Precautions: Fall Restrictions Weight Bearing Restrictions: No       Pain:  Feet; no number given     See FIM for current functional status  Therapy/Group: Individual Therapy  Lisa Roca 01/19/2015, 6:26 PM

## 2015-01-19 NOTE — Progress Notes (Signed)
[facility]     PROGRESS NOTE    Subjective/Complaints: Noted some increased swelling in legs. She is anxious to get home. Wants to sleep in her own bed.  ROS: Pt denies fever, rash/itching, headache, blurred or double vision, nausea, vomiting, abdominal pain, diarrhea, chest pain, shortness of breath, palpitations, dysuria, dizziness, neck or back pain, bleeding, anxiety, or depression   Objective: Vital Signs: Blood pressure 138/55, pulse 80, temperature 98.7 F (37.1 C), temperature source Oral, resp. rate 18, height 5\' 2"  (1.575 m), weight 58.7 kg (129 lb 6.6 oz), SpO2 96 %. No results found.  Recent Labs  01/18/15 0502 01/19/15 0512  WBC 30.1* 20.8*  HGB 10.7* 10.4*  HCT 31.8* 30.8*  PLT 148* 184    Recent Labs  01/17/15 0420  NA 142  K 3.8  CL 114*  GLUCOSE 131*  BUN 58*  CREATININE 1.42*  CALCIUM 7.7*   CBG (last 3)  No results for input(s): GLUCAP in the last 72 hours.  Wt Readings from Last 3 Encounters:  01/19/15 58.7 kg (129 lb 6.6 oz)  01/16/15 59.2 kg (130 lb 8.2 oz)  08/22/14 51.256 kg (113 lb)    Physical Exam:  Constitutional: She is oriented to person, place, and time with cueing. She appears well-developed and well-nourished.  HENT:  Head: Normocephalic and atraumatic.  Right Ear: External ear normal.  Left Ear: External ear normal.  Mouth/Throat: Oropharynx is clear and moist.  Eyes: Conjunctivae are normal. Pupils are equal, round, and reactive to light.  Neck: Normal range of motion. Neck supple. No JVD present. No tracheal deviation present. No thyromegaly present.  Cardiovascular: Normal rate and regular rhythm. Exam reveals no friction rub.  No murmur heard. Respiratory: Effort normal. No respiratory distress. She has decreased breath sounds in the right lower field and the left lower field. She has no wheezes. She exhibits no tenderness.  GI: Soft. Bowel sounds are normal. She exhibits no  distension. There is no tenderness. There is no rebound.  Genitourinary:  Left nephrostomy draining   yellow urine.  Musculoskeletal: She exhibits edema, tr to 1+ in legs. Minimal in arms   Neurological: She is alert and oriented to person, place, and time. No cranial nerve deficit. Coordination normal.  Oriented to situation, place, date with cues. Attention better today.  Able to follow basic one step commands without difficulty. UE's 3+ prox to 4/5 distally. LE's 2+hf, 3/5 ke and 3+ ankles. Senses pain and LT in both legs. No rigidity or tremor. Masked facies. Delayed processing. No sensory deficits  Skin: Skin is warm and dry.  Ischemic areas distal fingers and toes. Toes more affected. Left second toe dusky--stable. webspaces clean/with breakdown. Psychiatric: She has a normal mood and affect. No agitation or anxiety   Assessment/Plan: 1. Functional deficits secondary to debility/encephalopathy after septic shock/prolonged hospital course which require 3+ hours per day of interdisciplinary therapy in a comprehensive inpatient rehab setting. Physiatrist is providing close team supervision and 24 hour management of active medical problems listed below. Physiatrist and rehab team continue to assess barriers to discharge/monitor patient progress toward functional and medical goals. FIM: FIM - Bathing Bathing Steps Patient Completed: Chest, Right Arm, Left Arm, Abdomen, Front perineal area, Buttocks, Right upper leg, Left upper leg, Right lower leg (including foot), Left lower leg (including foot) Bathing: 4: Steadying assist (mod verbal cues)  FIM - Upper Body Dressing/Undressing Upper body dressing/undressing steps patient completed: Thread/unthread right bra strap, Thread/unthread left bra  strap, Thread/unthread right sleeve of pullover shirt/dresss, Thread/unthread left sleeve of pullover shirt/dress, Put head through opening of pull over shirt/dress, Pull shirt over trunk Upper body  dressing/undressing: 4: Min-Patient completed 75 plus % of tasks FIM - Lower Body Dressing/Undressing Lower body dressing/undressing steps patient completed: Thread/unthread right underwear leg, Thread/unthread left underwear leg, Pull underwear up/down, Thread/unthread left pants leg, Pull pants up/down, Thread/unthread right pants leg Lower body dressing/undressing: 3: Mod-Patient completed 50-74% of tasks  FIM - Toileting Toileting steps completed by patient: Adjust clothing prior to toileting, Performs perineal hygiene, Adjust clothing after toileting Toileting Assistive Devices: Grab bar or rail for support Toileting: 4: Steadying assist (mod verbal cues)  FIM - Radio producer Devices: Grab bars Toilet Transfers: 4-To toilet/BSC: Min A (steadying Pt. > 75%), 4-From toilet/BSC: Min A (steadying Pt. > 75%)  FIM - Bed/Chair Transfer Bed/Chair Transfer: 5: Supine > Sit: Supervision (verbal cues/safety issues), 4: Bed > Chair or W/C: Min A (steadying Pt. > 75%)  FIM - Locomotion: Wheelchair Locomotion: Wheelchair: 0: Activity did not occur (pt ambulatory) FIM - Locomotion: Ambulation Locomotion: Ambulation Assistive Devices: Other (comment) (no AD) Ambulation/Gait Assistance: 4: Min guard Locomotion: Ambulation: 4: Travels 150 ft or more with minimal assistance (Pt.>75%)  Comprehension Comprehension Mode: Auditory Comprehension: 5-Set-up assist with hearing device  Expression Expression Mode: Verbal Expression: 5-Expresses basic needs/ideas: With extra time/assistive device  Social Interaction Social Interaction: 5-Interacts appropriately 90% of the time - Needs monitoring or encouragement for participation or interaction.  Problem Solving Problem Solving: 2-Solves basic 25 - 49% of the time - needs direction more than half the time to initiate, plan or complete simple activities  Memory Memory: 2-Recognizes or recalls 25 - 49% of the time/requires  cueing 51 - 75% of the time  Medical Problem List and Plan: 1. Functional deficits secondary to Encephalopathy and debility after sepsis 2. DVT Prophylaxis/Anticoagulation: Mechanical: Sequential compression devices, below knee Bilateral lower extremities  -TEDs, elevate legs 3. Pain Management: Tylenol prn for pain 4. Mood: LCSW to follow with patient and husband for support. 5. Neuropsych: This patient is not capable of making decisions on her own behalf.  -some improvement today  -no infectious or metabolic cause for change. dont think CT is needed at this point (one was just done a few days ago)-  -follow for clinical changes 6. Skin/Wound Care: Routine pressure relief measures. Encourage appropriate nutrition  -padding, local care between toes 7. Fluids/Electrolytes/Nutrition: push po and supps 8. Left ureteral stone: Left nephrostomy tube in place with good UOP. 9. AKI: Resolving post L-nephrostomy tube. BUN/CR continue to fall. 10 Urosepsis due to E coli bacteremia/UTI: Continue antibiotics D # 10/ 14 days.  11. Hypokalemia: continue to replace with supplement 12. Leucocytosis: trending down,  -steroid effect  -count 20k today !  -watch hgb also  -daily cbc 13. Parkinson's disease: On Sinemet 50/200 tid and artane bid  14. Acute respiratory failure: Resolved. Lasix on hold due to AKI. Encourage IS.  15. Insomnia: klonopin.  16. Thrombocytopenia: platelets recovering---184k today  LOS (Days) 3 A FACE TO FACE EVALUATION WAS PERFORMED  Terrisa Curfman T 01/19/2015 8:42 AM

## 2015-01-19 NOTE — Progress Notes (Addendum)
Physical Therapy Session Note  Patient Details  Name: CAMBRE MATSON MRN: [id number] Date of Birth: 10/29/1950  Today's Date: 01/19/2015 PT Individual Time: 1010-1110 PT Individual Time Calculation (min): 60 min   Short Term Goals: Week 1:  PT Short Term Goal 1 (Week 1): = LTGs due to anticipated LOS  Skilled Therapeutic Interventions/Progress Updates:   Pt donned shoes over TEDs with min assist, VCS for crossing legs to avoid LBP, and for laces.  Gait to/from room with close supervision>min guard at end of session.  Up/down 12 steps 2 rails, in gym, with close supervision, step through method.  Endurance activities: NuStep at level 4 x 8 minutes; static standing on compliant Air mat during bil UE fine motor activity; R/L step/taps onto 7" high step x 15 each, min assist for balance  Therapeutic activities for balance retraining: standing on wedge for activation of ankle and hip strategies, standing on compliant Airex mat for static>dynamic standing, with bil UE support, and 10 mini squats and 10 calf raises/toe raises and R/L hip abduction, kicking Yoga block during gait x 75' with min HHA without LOB, squatting to retrieve items from floor, x 12. All activities with min guard>min assist.   Dual task activities: carrrying open cup of ice water during gait with min HHA, remembering locations/sequencing retrieval of 12 floor targets spread throughout gym.   Pt needed mod cues for sequencing numbers, and did not remember placement of floor targets despite ambulating by some of them 5-6 x.    Pt returned to room and left resting in w/c with quick release belt in place.  All needs within reach. Therapy Documentation Precautions:  Precautions Precautions: Fall Restrictions Weight Bearing Restrictions: No   Pain: pain LB 6/10 during leaning forward to don and toe shoes; relieved with change of position.      See FIM for current functional status  Therapy/Group: Individual  Therapy  Tyrece Vanterpool 01/19/2015, 4:15 PM

## 2015-01-20 ENCOUNTER — Inpatient Hospital Stay (HOSPITAL_COMMUNITY): Payer: BLUE CROSS/BLUE SHIELD

## 2015-01-20 ENCOUNTER — Inpatient Hospital Stay (HOSPITAL_COMMUNITY): Payer: BLUE CROSS/BLUE SHIELD | Admitting: Speech Pathology

## 2015-01-20 ENCOUNTER — Inpatient Hospital Stay (HOSPITAL_COMMUNITY): Payer: BLUE CROSS/BLUE SHIELD | Admitting: Physical Therapy

## 2015-01-20 ENCOUNTER — Encounter (HOSPITAL_COMMUNITY): Payer: BLUE CROSS/BLUE SHIELD

## 2015-01-20 MED ORDER — BACITRACIN 500 UNIT/GM EX OINT
1.0000 "application " | TOPICAL_OINTMENT | Freq: Two times a day (BID) | CUTANEOUS | Status: DC
  Filled 2015-01-20 (×7): qty 0.9

## 2015-01-20 NOTE — Progress Notes (Signed)
[name], MD Physician Signed Physical Medicine and Rehabilitation Consult Note 01/14/2015 10:38 AM  Related encounter: ED to Hosp-Admission (Discharged) from 01/09/2015 in [place] [facility]        Physical Medicine and Rehabilitation Consult  Reason for Consult: Encephalopathy due to E coli sepsis in Parkinson's patient Referring Physician: Dr. Sherral Hammers.    HPI: Becky Rogers is a 64 y.o. female with history of Parkinson's disease with mild dementia, breast cancer, HTN who was admitted 01/09/15 with abdominal pain, acute renal failure and hypotension due to urosepsis. She was started on pressors, solucortef and fluids as well as broad spectrum antibiotics for septic shock. CT revealed left ureteral stone with concerns for obstructed pyonephrosis and underwent left percutaneous nephrostomy by interventional radiology. Dr. Alinda Money recommended 14 days of culture specific antibiotics prior to any definitive stone procedure and follow up with Dr. Tresa Moore on outpatient basis. Antibiotics to ceftriaxone for pansensitive E coli bacteremia. Mentation improving but WBC on upward trend. Swallow evaluation revealed mild cognitively based dysphagia and esophageal/aspiration precautions recommended. Mobility limited by foot pain due to ischemic toes. CIR recommended by MD and Rehab team.    Review of Systems  Unable to perform ROS: mental acuity  Eyes: Negative for blurred vision.  Respiratory: Negative for shortness of breath.  Cardiovascular: Negative for chest pain.  Gastrointestinal: Negative for abdominal pain.  Musculoskeletal: Positive for myalgias.  Neurological: Positive for dizziness. Negative for headaches.  Psychiatric/Behavioral: Positive for memory loss.      Past Medical History  Diagnosis Date  . Hypertension   . Hypercholesteremia   . Parkinson disease   . Breast cancer     right    Past Surgical  History  Procedure Laterality Date  . Cesarean section      x2  . Ovarian cyst removal      age 76  . Appendectomy    . Colonoscopy    . Dental surgery      implant  . Breast lumpectomy with radioactive seed localization Right 08/28/2014    Procedure: RIGHT BREAST LUMPECTOMY WITH RADIOACTIVE SEED LOCALIZATION; Surgeon: [name], MD; Location: [facility]; Service: General; Laterality: Right;    History reviewed. No pertinent family history.    Social History: Married. Independent PTA. 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 Reports that husband does most of home management and meal prep? she reports that she has never smoked. She does not have any smokeless tobacco history on file. Per reports that she drinks alcohol. She reports that she does not use illicit drugs.    Allergies: No Known Allergies    Medications Prior to Admission  Medication Sig Dispense Refill  . amLODipine (NORVASC) 5 MG tablet Take 5 mg by mouth daily.    Marland Kitchen aspirin 81 MG tablet Take 81 mg by mouth daily.    Marland Kitchen atorvastatin (LIPITOR) 40 MG tablet Take 40 mg by mouth daily.    Marland Kitchen CALCIUM-VITAMIN D PO Take 2 tablets by mouth daily.    . carbidopa-levodopa (SINEMET IR) 25-100 MG per tablet Take 2 tablets by mouth 3 (three) times daily.    . clonazePAM (KLONOPIN) 0.5 MG tablet Take 0.5 mg by mouth at bedtime.    . fish oil-omega-3 fatty acids 1000 MG capsule Take 3 g by mouth daily.    . Multiple Vitamins-Minerals (MULTIVITAMINS THER. W/MINERALS) TABS Take 1 tablet by mouth daily.    Marland Kitchen  TRIHEXYPHENIDYL HCL PO Take 1 tablet by mouth 2 (two) times daily.    Marland Kitchen HYDROcodone-acetaminophen (NORCO) 5-325 MG per tablet Take 1-2 tablets by mouth every 6 (six) hours as needed for moderate pain or severe pain. (Patient not taking: Reported on 01/09/2015) 30 tablet 0     Home: Home Living Family/patient expects to be discharged to:: Private residence Living Arrangements: Spouse/significant other Additional Comments: patient with AMS unable to provide history, per review of chart and staff communication, patient was independent prior to admission  Functional History: Prior Function Level of Independence: Independent Functional Status:  Mobility: Bed Mobility Overal bed mobility: Needs Assistance, +2 for physical assistance Bed Mobility: Supine to Sit Supine to sit: Mod assist, +2 for physical assistance General bed mobility comments: unable to problem solve or sequence through tasks to come to EOB, assist provided Transfers Overall transfer level: Needs assistance Equipment used: 2 person hand held assist Transfers: Sit to/from Stand, Stand Pivot Transfers Sit to Stand: Mod assist, +2 physical assistance Stand pivot transfers: Mod assist, +2 physical assistance General transfer comment: assist to power up, patient with some shuffling steps to chair but continued to require cues and assist      ADL:    Cognition: Cognition Overall Cognitive Status: Impaired/Different from baseline Orientation Level: Oriented to person, Disoriented to time, Disoriented to situation Cognition Arousal/Alertness: Awake/alert Behavior During Therapy: Flat affect Overall Cognitive Status: Impaired/Different from baseline Area of Impairment: Orientation, Attention, Memory, Following commands, Safety/judgement, Awareness, Problem solving Orientation Level: Disoriented to, Place, Time, Situation Current Attention Level: Focused Memory: Decreased short-term memory Following Commands: Follows one step commands inconsistently, Follows one step commands with increased time Safety/Judgement: Decreased awareness of safety, Decreased awareness of deficits Awareness: Intellectual Problem Solving: Slow processing, Decreased initiation, Difficulty sequencing, Requires  verbal cues, Requires tactile cues General Comments: Patient able to initiate some commands but unable to carry out, patient with word finding difficulties and question some aspects of confabulation (pt reports she is here because she broke her ankle)   Blood pressure 109/56, pulse 67, temperature 98.6 F (37 C), temperature source Oral, resp. rate 18, height 5\' 2"  (1.575 m), weight 58.8 kg (129 lb 10.1 oz), SpO2 100 %. Physical Exam  Nursing note and vitals reviewed. Constitutional: She appears well-developed and well-nourished.  HENT:  Head: Normocephalic and atraumatic.  Eyes: Conjunctivae are normal. Pupils are equal, round, and reactive to light.  Neck: Normal range of motion. Neck supple.  Cardiovascular: Normal rate and regular rhythm.  Respiratory: Effort normal and breath sounds normal. No respiratory distress. She has no wheezes.  GI: Soft. Bowel sounds are normal. She exhibits no distension. There is no tenderness.  Musculoskeletal: She exhibits edema (1+ edema LUE).  Neurological: She is alert.  Masked facies, no rigidity or obvious tremor. slow to initiate verbally and motorically though.Oriented to self and place "hospital". Situation with cues. Date "Jan" but told me yesterday was the 4th of July Distracted and had difficulty answering medical/social questions. Able to follow simple motor commands and had difficulty with two step commands. UE's 3+ prox to 4/5 distally. LE's 2+hf, 3/5 ke and 3+ ankles. Senses pain and LT in both legs.  Skin: Skin is warm. She is diaphoretic.  Diaphoretic. Digits on bilateral hands and toes with different stages of cyanosis. Toes mildly tender to touch.  Psychiatric: Her speech is delayed. She is slowed. She exhibits abnormal recent memory and abnormal remote memory.  Pleasant but confused She is inattentive.     Lab  Results Last 24 Hours    Results for orders placed or performed during the hospital encounter of 01/09/15 (from the past  24 hour(s))  Glucose, capillary Status: Abnormal   Collection Time: 01/13/15 11:53 AM  Result Value Ref Range   Glucose-Capillary 100 (H) 65 - 99 mg/dL  Glucose, capillary Status: None   Collection Time: 01/13/15 4:53 PM  Result Value Ref Range   Glucose-Capillary 92 65 - 99 mg/dL  Glucose, capillary Status: Abnormal   Collection Time: 01/13/15 7:59 PM  Result Value Ref Range   Glucose-Capillary 112 (H) 65 - 99 mg/dL  Glucose, capillary Status: None   Collection Time: 01/14/15 12:21 AM  Result Value Ref Range   Glucose-Capillary 99 65 - 99 mg/dL  CBC Status: Abnormal   Collection Time: 01/14/15 2:45 AM  Result Value Ref Range   WBC 55.3 (HH) 4.0 - 10.5 K/uL   RBC 4.45 3.87 - 5.11 MIL/uL   Hemoglobin 13.2 12.0 - 15.0 g/dL   HCT 37.8 36.0 - 46.0 %   MCV 84.9 78.0 - 100.0 fL   MCH 29.7 26.0 - 34.0 pg   MCHC 34.9 30.0 - 36.0 g/dL   RDW 14.3 11.5 - 15.5 %   Platelets 29 (LL) 150 - 400 K/uL  Basic metabolic panel Status: Abnormal   Collection Time: 01/14/15 2:45 AM  Result Value Ref Range   Sodium 140 135 - 145 mmol/L   Potassium 3.7 3.5 - 5.1 mmol/L   Chloride 104 101 - 111 mmol/L   CO2 24 22 - 32 mmol/L   Glucose, Bld 112 (H) 65 - 99 mg/dL   BUN 91 (H) 6 - 20 mg/dL   Creatinine, Ser 3.43 (H) 0.44 - 1.00 mg/dL   Calcium 7.9 (L) 8.9 - 10.3 mg/dL   GFR calc non Af Amer 13 (L) >60 mL/min   GFR calc Af Amer 15 (L) >60 mL/min   Anion gap 12 5 - 15  Glucose, capillary Status: Abnormal   Collection Time: 01/14/15 7:54 AM  Result Value Ref Range   Glucose-Capillary 101 (H) 65 - 99 mg/dL      Imaging Results (Last 48 hours)    Ct Head Wo Contrast  01/12/2015 CLINICAL DATA: Altered mental status, sepsis. EXAM: CT HEAD WITHOUT CONTRAST TECHNIQUE: Contiguous axial images were obtained from the base of the  skull through the vertex without intravenous contrast. COMPARISON: None. FINDINGS: No acute intracranial abnormality. Specifically, no hemorrhage, hydrocephalus, mass lesion, acute infarction, or significant intracranial injury. No acute calvarial abnormality. Visualized paranasal sinuses and mastoids clear. Orbital soft tissues unremarkable. IMPRESSION: No acute intracranial abnormality. Electronically Signed By: Rolm Baptise M.D. On: 01/12/2015 13:09   Dg Chest Port 1 View  01/13/2015 CLINICAL DATA: Pulmonary infiltrate EXAM: PORTABLE CHEST - 1 VIEW COMPARISON: 01/11/2015 FINDINGS: Stable positioning of left IJ central line, tip at the upper cavoatrial junction. Stable borderline cardiomegaly. Negative aortic and hilar contours. Increased hazy density over the lower chest compatible with pleural fluid. The change could reflect increase or differential layering. Diffuse interstitial opacities compatible with edema and confirmed on abdominal CT 01/09/2015. IMPRESSION: Pulmonary edema with bilateral pleural effusion. Left pleural fluid could have increased or layered. Electronically Signed By: Monte Fantasia M.D. On: 01/13/2015 06:12     Assessment/Plan: Diagnosis: debility and encephalopathy after sepsis, hx of parkinson's disease 1. Does the need for close, 24 hr/day medical supervision in concert with the patient's rehab needs make it unreasonable for this patient to be served in a less intensive setting?  Yes 2. Co-Morbidities requiring supervision/potential complications: ams, pylonephritis 3. Due to bladder management, bowel management, safety, skin/wound care, disease management, medication administration, pain management and patient education, does the patient require 24 hr/day rehab nursing? Yes 4. Does the patient require coordinated care of a physician, rehab nurse, PT (1-2 hrs/day, 5 days/week), OT (1-2 hrs/day, 5 days/week) and SLP (1-2 hrs/day, 5 days/week) to  address physical and functional deficits in the context of the above medical diagnosis(es)? Yes Addressing deficits in the following areas: balance, endurance, locomotion, strength, transferring, bowel/bladder control, bathing, dressing, feeding, grooming, toileting, cognition, speech, swallowing and psychosocial support 5. Can the patient actively participate in an intensive therapy program of at least 3 hrs of therapy per day at least 5 days per week? Yes 6. The potential for patient to make measurable gains while on inpatient rehab is excellent 7. Anticipated functional outcomes upon discharge from inpatient rehab are supervision with PT, supervision with OT, supervision with SLP. 8. Estimated rehab length of stay to reach the above functional goals is: 11-15 days 9. Does the patient have adequate social supports and living environment to accommodate these discharge functional goals? Yes 10. Anticipated D/C setting: Home 11. Anticipated post D/C treatments: HH therapy and Outpatient therapy 12. Overall Rehab/Functional Prognosis: excellent  RECOMMENDATIONS: This patient's condition is appropriate for continued rehabilitative care in the following setting: CIR Patient has agreed to participate in recommended program. Yes Note that insurance prior authorization may be required for reimbursement for recommended care.  Comment: Rehab Admissions Coordinator to follow up.  Thanks,  [name], MD, Mellody Drown     01/14/2015       Revision History     Date/Time User Provider Type Action   01/14/2015 1:51 PM [name], MD Physician Sign   01/14/2015 11:26 AM Bary Leriche, PA-C Physician Assistant Pend   View Details Report       Routing History     Date/Time From To Method   01/14/2015 1:51 PM [name], MD [name], MD In Basket   01/14/2015 1:51 PM [name], MD Pcp Not In System In Basket

## 2015-01-20 NOTE — Consult Note (Signed)
WOC wound consult note Reason for Consult: blistering of her finger tips and toes related to resent sepsis and shock, use of vasopressors Wound type: Ischemic injury to the bilateral feet. She has darkening of the heels (like a deep tissue injury) however with the condition of the toes more likely all related to ischemia and sustained low BP.  She also has fluid filled bullas on the most of her finger tips. She lost the toenail on the left 4th toe recently and it appears the left 5th toe will fall off soon. She really has no open area on the feet except for the right fifth toe and the area where the toenail has been lost.  PA had written orders for antibiotic ointment today and I agree with use on the open areas, but I suggested not to use any ointment between the toes, using gauze to keep this area dry.  No other topical care for the finger tips that have intact bulla  At this time.  The left middle finger has ruptured, we can use the antibiotic ointment on this area as well with dry gauze to cover.  Discussed POC with patient and bedside nurse.  Re consult if needed, will not follow at this time. Thanks  Maythe Deramo Kellogg, [place] [phone])

## 2015-01-20 NOTE — Progress Notes (Signed)
Speech Language Pathology Daily Session Note  Patient Details  Name: Becky Rogers MRN: [id number] Date of Birth: [birth year]  Today's Date: 01/20/2015 SLP Individual Time: 1779-3903 SLP Individual Time Calculation (min): 55 min  Short Term Goals: Week 1: SLP Short Term Goal 1 (Week 1): Patient will utilize external memory aids for orientation and recall of daily activities with Min A question and verbal cues.  SLP Short Term Goal 2 (Week 1): Patient will demonstrate functional problem solving for basic and familiar tasks with Min A verbal cues.  SLP Short Term Goal 3 (Week 1): Patient will self-monitor and correct errors during functional tasks with Min A verbal and question cues. SLP Short Term Goal 4 (Week 1): Patient will demonstrate selective attention to a functional task for 30 minutes with Min A verbal cues for redirection.  SLP Short Term Goal 5 (Week 1): Patient will utilize call bell to request assistance in 75% of observable opportunities with Min A question cues.   Skilled Therapeutic Interventions:  Pt was seen for skilled ST targeting cognitive goals.  Upon arrival, pt was seated upright in bed, awake, alert, and agreeable to participate in [place].  Pt recalled location of and utilized external memory aid to facilitate recall of sequence of events leading up to CIR admission with min question cues.  SLP facilitated the session with various money management tasks targeting functional problem solving for basic home management.  Pt counted money, made change, and generated values when named with both coins and bills with overall min assist verbal cues.  She also completed basic word problems with overall min assist verbal cues and extra processing time for numerical reasoning.  She had significant difficulty with higher level financial management which appeared to be related to attention and working memory deficits.  Pt required max assist verbal and visual cues to organize transactions into  a check book registry.  Pt was left in bed with all needs in reach and bed alarm activated.  Continue per current plan of care.    FIM:  Comprehension Comprehension Mode: Auditory Comprehension: 5-Follows basic conversation/direction: With extra time/assistive device Expression Expression Mode: Verbal Expression: 5-Expresses basic needs/ideas: With extra time/assistive device Social Interaction Social Interaction: 5-Interacts appropriately 90% of the time - Needs monitoring or encouragement for participation or interaction. Problem Solving Problem Solving: 3-Solves basic 50 - 74% of the time/requires cueing 25 - 49% of the time Memory Memory: 3-Recognizes or recalls 50 - 74% of the time/requires cueing 25 - 49% of the time FIM - Eating Eating Activity: 6: More than reasonable amount of time  Pain Pain Assessment Pain Assessment: No/denies pain  Therapy/Group: Individual Therapy  Maysel Mccolm, Selinda Orion 01/20/2015, 4:26 PM

## 2015-01-20 NOTE — Progress Notes (Signed)
Retta Diones, RN Rehab Admission Coordinator Signed Physical Medicine and Rehabilitation PMR Pre-admission 01/16/2015 9:21 AM  Related encounter: ED to Hosp-Admission (Discharged) from 01/09/2015 in [facility]   PMR Admission Coordinator Pre-Admission Assessment  Patient: Becky Rogers is an 64 y.o., female MRN: [id number] DOB: 1951-03-06 Height: 5' 2"  (157.5 cm) Weight: 59.2 kg (130 lb 8.2 oz)  Insurance Information HMO: PPO: PCP: IPA: 80/20: OTHER: Blue Options 123 PRIMARY: BCBS Policy#: [id number] Subscriber: Garen Lah CM Name: Elspeth Cho Phone#: [phone] Fax#: [phone] Pre-Cert#: [id number] with update due 01/21/15 Employer: [employer] Benefits: Phone #: [phone] Name: On line Eff. Date: 06/11/14 Deduct: $1500 (met $121.35) Out of Pocket Max: $3500 (met $ 121.35) Life Max: unlimited CIR: $250 copay and the 80% SNF: 80% with 60 days max Outpatient: 30 visits max Co-Pay: $20 copay Home Health: 80% Co-Pay: 20% DME: 80% Co-Pay: 20% Providers: in Therapist, art Information    Name Relation Home Work Mobile   [place] Spouse [phone]  [phone]     Current Medical History  Patient Admitting Diagnosis: Debility and encephalopathy after sepsis, hx of parkinson's disease   History of Present Illness: A 64 y.o. female with history of Parkinson's disease with mild dementia, breast cancer, HTN who was admitted 01/09/15 with abdominal pain, acute renal failure and hypotension due to urosepsis. She was started on pressors, solucortef and fluids as well as broad spectrum antibiotics for septic shock. CT revealed left  ureteral stone with concerns for obstructed pyonephrosis and underwent left percutaneous nephrostomy by interventional radiology. Dr. Alinda Money recommended 14 days of culture specific antibiotics prior to any definitive stone procedure and follow up with Dr. Tresa Moore on outpatient basis. Antibiotics to ceftriaxone for pansensitive E coli bacteremia. Mentation improving but WBC on upward trend. Swallow evaluation revealed mild cognitively based dysphagia and esophageal/aspiration precautions recommended. Mobility limited by foot pain due to ischemic toes. Fingers are also ischemic. CIR recommended by MD and Rehab team.   Past Medical History  Past Medical History  Diagnosis Date  . Hypertension   . Hypercholesteremia   . Parkinson disease   . Breast cancer     right    Family History  family history is not on file.  Prior Rehab/Hospitalizations:  Has the patient had major surgery during 100 days prior to admission? No  Current Medications   Current facility-administered medications:  . carbidopa-levodopa (SINEMET IR) 25-100 MG per tablet immediate release 2 tablet, 2 tablet, Oral, TID, Marijean Heath, NP, 2 tablet at 01/16/15 1025 . cefTRIAXone (ROCEPHIN) 2 g in dextrose 5 % 50 mL IVPB - Premix, 2 g, Intravenous, Q24H, Wilhelmina Mcardle, MD, 2 g at 01/15/15 2223 . clonazePAM (KLONOPIN) tablet 0.5 mg, 0.5 mg, Oral, QHS, Cherene Altes, MD, 0.5 mg at 01/15/15 2223 . fentaNYL (SUBLIMAZE) injection 25-50 mcg, 25-50 mcg, Intravenous, Q1H PRN, Rahul P Desai, PA-C . hydrocortisone sodium succinate (SOLU-CORTEF) 100 MG injection 50 mg, 50 mg, Intravenous, Q12H, Cherene Altes, MD, 50 mg at 01/16/15 0123 . ondansetron (ZOFRAN) injection 4 mg, 4 mg, Intravenous, Q6H PRN, Anders Simmonds, MD, 4 mg at 01/10/15 0314 . trihexyphenidyl (ARTANE) tablet 2 mg, 2 mg, Oral, BID WC, Marijean Heath, NP, 2 mg at 01/16/15 1025  Patients Current Diet: Diet regular Room  service appropriate?: Yes; Fluid consistency:: Thin  Precautions / Restrictions Precautions Precautions: Fall Restrictions Weight Bearing Restrictions: No  Has the patient had 2 or more falls or a fall with injury in the past year?No  Prior Activity Level    Home Assistive Devices / Equipment Home Assistive Devices/Equipment: None Home Equipment: None  Prior Device Use: Indicate devices/aids used by the patient prior to current illness, exacerbation or injury? None of the above. Was very independent.  Prior Functional Level Prior Function Level of Independence: Independent  Self Care: Did the patient need help bathing, dressing, using the toilet or eating? Independent  Indoor Mobility: Did the patient need assistance with walking from room to room (with or without device)? Independent  Stairs: Did the patient need assistance with internal or external stairs (with or without device)? Independent  Functional Cognition: Did the patient need help planning regular tasks such as shopping or remembering to take medications? Independent  Current Functional Level Cognition  Overall Cognitive Status: Impaired/Different from baseline Current Attention Level: Selective Orientation Level: Oriented to person, Disoriented to place, Disoriented to time, Disoriented to situation Following Commands: Follows one step commands consistently Safety/Judgement: Decreased awareness of safety, Decreased awareness of deficits General Comments: Pt used newspaper to locate date.   Extremity Assessment (includes Sensation/Coordination)  Upper Extremity Assessment: Generalized weakness (discoloration of fingers from septic shock)  Lower Extremity Assessment: Defer to PT evaluation RLE Deficits / Details: patient with discoloration on toes1st2nd 4th and 5th LLE Deficits / Details: patient with discoloration on toes(all toes, with bumpbs noted on great toe)    ADLs  Overall ADL's : Needs  assistance/impaired Eating/Feeding: Sitting, Supervision/ safety Grooming: Wash/dry hands, Wash/dry face, Sitting, Set up Upper Body Bathing: Minimal assitance, Sitting Lower Body Bathing: Minimal assistance, Sit to/from stand Upper Body Dressing : Minimal assistance, Sitting Lower Body Dressing: Minimal assistance, Sit to/from stand Toilet Transfer: Minimal assistance, Ambulation, BSC Toileting- Clothing Manipulation and Hygiene: Minimal assistance, Sit to/from stand Functional mobility during ADLs: Minimal assistance General ADL Comments: able to donn and doff socks    Mobility  Overal bed mobility: Needs Assistance Bed Mobility: Supine to Sit Supine to sit: Supervision General bed mobility comments: supervision for safety as pt impulsive despite cues    Transfers  Overall transfer level: Needs assistance Equipment used: 1 person hand held assist Transfers: Sit to/from Stand Sit to Stand: Min guard Stand pivot transfers: Mod assist, +2 physical assistance General transfer comment: impulsive with sit to stand, cues not to pull on RW    Ambulation / Gait / Stairs / Wheelchair Mobility  Ambulation/Gait Ambulation/Gait assistance: Museum/gallery curator (Feet): 300 Feet Assistive device: Rolling walker (2 wheeled), None General Gait Details: pt used RW without LOB with cues for direction and position in RW. Walked half distance without RW with 3 LOb with turning with tactile cues to recover Gait Pattern/deviations: Step-through pattern, Decreased stride length Gait velocity interpretation: Below normal speed for age/gender    Posture / Balance Balance Overall balance assessment: Needs assistance Sitting-balance support: Feet supported Sitting balance-Leahy Scale: Good Standing balance support: During functional activity Standing balance-Leahy Scale: Fair Standardized Balance Assessment Standardized Balance Assessment : Berg Balance Test Berg Balance  Test Sit to Stand: Able to stand without using hands and stabilize independently Standing Unsupported: Able to stand safely 2 minutes Sitting with Back Unsupported but Feet Supported on Floor or Stool: Able to sit safely and securely 2 minutes Stand to Sit: Sits safely with minimal use of hands Transfers: Able to transfer safely, minor use of hands Standing Unsupported with Eyes Closed: Able to stand 10 seconds safely  Standing Ubsupported with Feet Together: Able to place feet together independently and stand 1 minute safely From Standing, Reach Forward with Outstretched Arm: Can reach forward >12 cm safely (5") From Standing Position, Pick up Object from Floor: Able to pick up shoe safely and easily From Standing Position, Turn to Look Behind Over each Shoulder: Looks behind one side only/other side shows less weight shift Turn 360 Degrees: Able to turn 360 degrees safely but slowly Standing Unsupported, Alternately Place Feet on Step/Stool: Needs assistance to keep from falling or unable to try Standing Unsupported, One Foot in Front: Able to plae foot ahead of the other independently and hold 30 seconds Standing on One Leg: Tries to lift leg/unable to hold 3 seconds but remains standing independently Total Score: 44    Special needs/care consideration BiPAP/CPAP No CPM No Continuous Drip IV No  Dialysis No  Life Vest No Oxygen No Special Bed No Trach Size No Wound Vac (area) No  Skin Bruising on arms. Ischemic, discolored fingers and toes.  Bowel mgmt: Last BM 01/16/15 Bladder mgmt: Voiding WDL Diabetic mgmt No    Previous Home Environment Living Arrangements: Spouse/significant other Available Help at Discharge: Family, Available PRN/intermittently Type of Home: House Bathroom Shower/Tub: Walk-in Radio producer: Standard Home Care Services: No Additional Comments: Pt's husband works full-time. He performs housekeeping. Pt  drives, performs light meal prep and self care independently. Daughter in room and stated pt drove to her home in [place] and cared for her after and illness just 3 weeks ago.  Discharge Living Setting Plans for Discharge Living Setting: Patient's home, House, Lives with (comment) (Lives with husband.) Type of Home at Discharge: House Discharge Home Layout: Two level, Able to live on main level with bedroom/bathroom Alternate Level Stairs-Number of Steps: Flight Discharge Home Access: Stairs to enter [employer] of Steps: 8-10 steps from garage Does the patient have any problems obtaining your medications?: No  Social/Family/Support Systems Patient Roles: Spouse, Parent (Has a husband and 2 daughters.) One daughter lives in [place] and the other lives in [place]. Contact Information: Becky Rogers - spouse [phone] (c) [phone] Anticipated Caregiver: husband, family and neighbors Ability/Limitations of Caregiver: Husband worksdays. Caregiver Availability: Other (Comment) (Husband will work on a rotational schedule for care givers.) Discharge Plan Discussed with Primary Caregiver: Yes Is Caregiver In Agreement with Plan?: Yes Does Caregiver/Family have Issues with Lodging/Transportation while Pt is in Rehab?: No  Goals/Additional Needs Patient/Family Goal for Rehab: PT/OT/ST supervision goals Expected length of stay: 11-15 days Cultural Considerations: None Dietary Needs: Regular diet, thin liquids Equipment Needs: TBD Pt/Family Agrees to Admission and willing to participate: Yes Program Orientation Provided & Reviewed with Pt/Caregiver Including Roles & Responsibilities: Yes  Decrease burden of Care through IP rehab admission: N/A  Possible need for SNF placement upon discharge: Not planned  Patient Condition: This patient's medical and functional status has changed since the consult dated: 01/14/15 in which the Rehabilitation Physician determined and  documented that the patient's condition is appropriate for intensive rehabilitative care in an inpatient rehabilitation facility. See "History of Present Illness" (above) for medical update. Functional changes are: Currently requiring min assist to ambulate 300 ft RW. Patient's medical and functional status update has been discussed with the Rehabilitation physician and patient remains appropriate for inpatient rehabilitation. Will admit to inpatient rehab today.  Preadmission Screen Completed By: Retta Diones, 01/16/2015 1:20 PM ______________________________________________________________________  Discussed status with Dr. Naaman Plummer on 01/16/15 at 1322 and received telephone approval for admission today.  Admission Coordinator: Retta Diones, time1322/Date07/07/16          Cosigned by: [name], MD at 01/16/2015 3:22 PM  Revision History     Date/Time User Provider Type Action   01/16/2015 3:22 PM [name], MD Physician Cosign   01/16/2015 1:22 PM Retta Diones, RN Rehab Admission Coordinator Sign

## 2015-01-20 NOTE — Progress Notes (Signed)
[facility] PHYSICAL MEDICINE & REHABILITATION     PROGRESS NOTE    Subjective/Complaints: Feeling better today. Happy that swelling is better. Reading the paper  ROS: Pt denies fever, rash/itching, headache, blurred or double vision, nausea, vomiting, abdominal pain, diarrhea, chest pain, shortness of breath, palpitations, dysuria, dizziness, neck or back pain, bleeding, anxiety, or depression   Objective: Vital Signs: Blood pressure 124/51, pulse 83, temperature 98.6 F (37 C), temperature source Oral, resp. rate 18, height 5\' 2"  (1.575 m), weight 58.6 kg (129 lb 3 oz), SpO2 97 %. No results found.  Recent Labs  01/18/15 0502 01/19/15 0512  WBC 30.1* 20.8*  HGB 10.7* 10.4*  HCT 31.8* 30.8*  PLT 148* 184   No results for input(s): NA, K, CL, GLUCOSE, BUN, CREATININE, CALCIUM in the last 72 hours.  Invalid input(s): CO CBG (last 3)  No results for input(s): GLUCAP in the last 72 hours.  Wt Readings from Last 3 Encounters:  01/20/15 58.6 kg (129 lb 3 oz)  01/16/15 59.2 kg (130 lb 8.2 oz)  08/22/14 51.256 kg (113 lb)    Physical Exam:  Constitutional: She is oriented to person, place, and time with cueing. She appears well-developed and well-nourished.  HENT:  Head: Normocephalic and atraumatic.  Right Ear: External ear normal.  Left Ear: External ear normal.  Mouth/Throat: Oropharynx is clear and moist.  Eyes: Conjunctivae are normal. Pupils are equal, round, and reactive to light.  Neck: Normal range of motion. Neck supple. No JVD present. No tracheal deviation present. No thyromegaly present.  Cardiovascular: Normal rate and regular rhythm. Exam reveals no friction rub.  No murmur heard. Respiratory: Effort normal. No respiratory distress. She has decreased breath sounds in the right lower field and the left lower field. She has no wheezes. She exhibits no tenderness.  GI: Soft. Bowel sounds are normal. She exhibits no distension. There is no tenderness. There  is no rebound.  Genitourinary:  Left nephrostomy draining   yellow urine.  Musculoskeletal: She exhibits edema, tr to 1+ in legs. Minimal in arms   Neurological: She is alert and oriented to person, place, and time. No cranial nerve deficit. Coordination normal.  Oriented to situation, place, date with cues. Attention better today.  Able to follow basic one step commands without difficulty. UE's 3+ prox to 4/5 distally. LE's 2+hf, 3/5 ke and 3+ ankles. Senses pain and LT in both legs. No rigidity or tremor. Masked facies. Delayed processing. No sensory deficits  Skin: Skin is warm and dry.  Toes blistering, evolving. No infection, mild drainage. minimally tender Psychiatric: She has a normal mood and affect. No agitation or anxiety   Assessment/Plan: 1. Functional deficits secondary to debility/encephalopathy after septic shock/prolonged hospital course which require 3+ hours per day of interdisciplinary therapy in a comprehensive inpatient rehab setting. Physiatrist is providing close team supervision and 24 hour management of active medical problems listed below. Physiatrist and rehab team continue to assess barriers to discharge/monitor patient progress toward functional and medical goals. FIM: FIM - Bathing Bathing Steps Patient Completed: Chest, Right Arm, Left Arm, Abdomen, Front perineal area, Buttocks, Right upper leg, Left upper leg, Right lower leg (including foot), Left lower leg (including foot) Bathing: 4: Steadying assist (mod verbal cues)  FIM - Upper Body Dressing/Undressing Upper body dressing/undressing steps patient completed: Thread/unthread right bra strap, Thread/unthread left bra strap, Thread/unthread right sleeve of pullover shirt/dresss, Thread/unthread left sleeve of pullover shirt/dress, Put head through opening of pull over shirt/dress, Pull shirt over  trunk Upper body dressing/undressing: 4: Min-Patient completed 75 plus % of tasks FIM - Lower Body  Dressing/Undressing Lower body dressing/undressing steps patient completed: Thread/unthread right underwear leg, Thread/unthread left underwear leg, Pull underwear up/down, Thread/unthread left pants leg, Pull pants up/down, Thread/unthread right pants leg Lower body dressing/undressing: 3: Mod-Patient completed 50-74% of tasks  FIM - Toileting Toileting steps completed by patient: Adjust clothing prior to toileting, Performs perineal hygiene, Adjust clothing after toileting Toileting Assistive Devices: Grab bar or rail for support Toileting: 4: Steadying assist  FIM - Radio producer Devices: Grab bars Toilet Transfers: 4-To toilet/BSC: Min A (steadying Pt. > 75%), 4-From toilet/BSC: Min A (steadying Pt. > 75%)  FIM - Bed/Chair Transfer Bed/Chair Transfer: 5: Chair or W/C > Bed: Supervision (verbal cues/safety issues), 5: Bed > Chair or W/C: Supervision (verbal cues/safety issues)  FIM - Locomotion: Wheelchair Locomotion: Wheelchair: 0: Activity did not occur FIM - Locomotion: Ambulation Locomotion: Ambulation Assistive Devices: Other (comment) (none) Ambulation/Gait Assistance: 4: Min guard Locomotion: Ambulation: 4: Travels 150 ft or more with minimal assistance (Pt.>75%)  Comprehension Comprehension Mode: Auditory Comprehension: 5-Set-up assist with hearing device  Expression Expression Mode: Verbal Expression: 5-Expresses basic needs/ideas: With extra time/assistive device  Social Interaction Social Interaction: 5-Interacts appropriately 90% of the time - Needs monitoring or encouragement for participation or interaction.  Problem Solving Problem Solving: 2-Solves basic 25 - 49% of the time - needs direction more than half the time to initiate, plan or complete simple activities  Memory Memory: 2-Recognizes or recalls 25 - 49% of the time/requires cueing 51 - 75% of the time  Medical Problem List and Plan: 1. Functional deficits secondary to  Encephalopathy and debility after sepsis 2. DVT Prophylaxis/Anticoagulation: Mechanical: Sequential compression devices, below knee Bilateral lower extremities  -TEDs, elevate legs 3. Pain Management: Tylenol prn for pain 4. Mood: LCSW to follow with patient and husband for support. 5. Neuropsych: This patient is not capable of making decisions on her own behalf.  -some improvement today  -no infectious or metabolic cause for change. dont think CT is needed at this point (one was just done a few days ago)-  -follow for clinical changes 6. Skin/Wound Care: Routine pressure relief measures. Encourage appropriate nutrition  -padding, local care between toes  -will ask [place] RN for recs regarding management of feet as these wounds/blisters will continue to evolve as she heals 7. Fluids/Electrolytes/Nutrition: push po and supps 8. Left ureteral stone: Left nephrostomy tube in place with good UOP. 9. AKI: Resolving post L-nephrostomy tube. BUN/CR steadily improving. 10 Urosepsis due to E coli bacteremia/UTI: Continue antibiotics D # 11/ 14 days.  11. Hypokalemia: continue to replace with supplement 12. Leucocytosis: trending down,  -steroid effect  -wbc's trending down  -hgb in 10's  -daily cbc 13. Parkinson's disease: On Sinemet 50/200 tid and artane bid  14. Acute respiratory failure: Resolved. Lasix still on hold due to AKI. Encourage IS.  15. Insomnia: klonopin.  16. Thrombocytopenia: platelets recovering---184k today  LOS (Days) 4 A FACE TO FACE EVALUATION WAS PERFORMED  Rachella Basden T 01/20/2015 9:01 AM

## 2015-01-20 NOTE — Progress Notes (Signed)
Occupational Therapy Session Note  Patient Details  Name: Becky Rogers MRN: [id number] Date of Birth: 05-Jun-1951  Today's Date: 01/20/2015 OT Individual Time: 0700-0800 and 1500-1530 OT Individual Time Calculation (min): 60 min and 30 min    Short Term Goals: Week 1:  OT Short Term Goal 1 (Week 1): STGs equal to LTGs based on ELOS.  Skilled Therapeutic Interventions/Progress Updates:    Session 1: Pt seen for 1:1 OT session with a focus on ADL retraining, sequencing, functional mobility, functional transfers, standing balance, activity tolerance, and safety awareness. Pt received supine in bed agreeable to therapy. Pt completed bed mobility at supervision level. Pt ambulated to bathroom at SBA and completed toileting at steadying assist. Pt completed bathing sit<>stand at shower level with pt requiring steadying A predominantly during transitional movements. Pt completed dressing EOB with supervision-steadying A, predominantly for LB clothing management. Therapist discussed energy conservation strategies for completing ADL routine at home with pt. Pt demonstrated decreased sequencing and mild perseveration on certain aspects of routine during ADL session. Pt then completed functional ambulation in hallway apprx 75' at min guard-SBA. Pt engaged in standing balance activity of watering plants with close supervision. Pt demonstrated good standing balance in dynamic activity with UE not supported. Pt ambulated back to room and left seated in recliner with supervision. QR belt donned and all other needs within reach.   Session 2: Pt seen for 1:1 OT session with a focus on functional mobility, recall, path-finding, activity tolerance, and safety awareness. Pt received supine in bed agreeable to participate in therapy session. Pt completed bed mobility at supervision level. Pt donned socks and shoes seated EOB with therapist assist due to time constraint. Pt ambulated to elevator and downstairs to gift  shop at close supervision level. Therapist verbally listed 5 items for pt to find in gift shop. Pt required therapist to repeat list 1x in gift shop but pt able to locate 5/5 items with one repetition of list. Pt then requesting to go outside and ambulated to park bench at supervision for rest break. Pt then required to pathfind back to pt room. Pt required min cues for problem-solving out of small space. Pt demonstrates difficulty with sequencing, problem-solving, and working memory during functional tasks to be addressed with further therapy. Pt left supine in bed with bed alarm on and all other needs in reach.    Therapy Documentation Precautions:  Precautions Precautions: Fall Restrictions Weight Bearing Restrictions: No General:   Vital Signs:  Pain:   ADL:   Exercises:   Other Treatments:    See FIM for current functional status  Therapy/Group: Individual Therapy  Dorann Ou 01/20/2015, 9:46 AM

## 2015-01-20 NOTE — Progress Notes (Signed)
Physical Therapy Session Note  Patient Details  Name: Becky Rogers MRN: [id number] Date of Birth: March 23, 1951  Today's Date: 01/20/2015 PT Individual Time: 1300-1400 PT Individual Time Calculation (min): 60 min   Short Term Goals: Week 1:  PT Short Term Goal 1 (Week 1): = LTGs due to anticipated LOS  Skilled Therapeutic Interventions/Progress Updates:   Session focused on dynamic standing balance and cognitive goals with simple baking task in ADL kitchen. Gait without AD 2 x 250 ft with close supervision and no LOB noted. Patient completed task with min questioning cues for problem solving and mod-max multimodal cues for sequencing, working memory, and attention to task. Patient required supervision for standing balance in kitchen except when self-electing to perform deep squats x 2 in order to view items on lowest shelves, requiring min > mod A to return to complete stand. Patient required only one seated rest break throughout session. Patient requested to return to bed at end of session and left semi reclined in bed with all needs within reach and bed alarm on.   Therapy Documentation Precautions:  Precautions Precautions: Fall Restrictions Weight Bearing Restrictions: No Pain: Pain Assessment Pain Assessment: No/denies pain Locomotion : Ambulation Ambulation/Gait Assistance: 5: Supervision   See FIM for current functional status  Therapy/Group: Individual Therapy  Laretta Alstrom 01/20/2015, 4:50 PM

## 2015-01-21 ENCOUNTER — Inpatient Hospital Stay (HOSPITAL_COMMUNITY): Payer: BLUE CROSS/BLUE SHIELD | Admitting: Physical Therapy

## 2015-01-21 ENCOUNTER — Inpatient Hospital Stay (HOSPITAL_COMMUNITY): Payer: BLUE CROSS/BLUE SHIELD

## 2015-01-21 ENCOUNTER — Inpatient Hospital Stay (HOSPITAL_COMMUNITY): Payer: BLUE CROSS/BLUE SHIELD | Admitting: *Deleted

## 2015-01-21 ENCOUNTER — Inpatient Hospital Stay (HOSPITAL_COMMUNITY): Payer: BLUE CROSS/BLUE SHIELD | Admitting: Speech Pathology

## 2015-01-21 MED ORDER — BACITRACIN ZINC 500 UNIT/GM EX OINT
TOPICAL_OINTMENT | Freq: Two times a day (BID) | CUTANEOUS | Status: DC
  Administered 2015-01-21: 1 via TOPICAL
  Administered 2015-01-22: 15.5556 via TOPICAL
  Administered 2015-01-22: 10:00:00 via TOPICAL
  Administered 2015-01-23 – 2015-01-25 (×5): 15.5556 via TOPICAL
  Filled 2015-01-21: qty 28.35

## 2015-01-21 NOTE — Progress Notes (Addendum)
Physical Therapy Session Note  Patient Details  Name: MAHEK SCHLESINGER MRN: [id number] Date of Birth: 03/12/1951  Today's Date: 01/21/2015 PT Individual Time: 1000-1040  PT Individual Time Calculation (min): 40 min   Short Term Goals: Week 1:  PT Short Term Goal 1 (Week 1): = LTGs due to anticipated LOS  Skilled Therapeutic Interventions/Progress Updates:  Session 1: Focused on cognitive remediation and dynamic standing balance. Pt received in chair with shoes on. Pt ambulated 140 feet X 2 supervision with no LOB noted. Standing balance on airex pad while performing basic functional sorting task with focus on problem solving, sequencing, organization and working memory. Pt needed initial min cues for task but faded to mod-max multimodal cues to successfully complete task. Pt continues to demonstrate decreased memory of current task and decreased strategy for problem solving. Pt able to maintain standing for entire session without rest breaks. Pt returned to room in chair with all needs in reach and lap belt on.  Therapy Documentation Precautions:  Precautions Precautions: Fall Restrictions Weight Bearing Restrictions: No  Pain: Pain Assessment Pain Assessment: No/denies pain  See FIM for current functional status  Therapy/Group: Individual Therapy  Elsie Ra 01/21/2015, 12:26 PM

## 2015-01-21 NOTE — Progress Notes (Signed)
Recreational Therapy Session Note  Patient Details  Name: Becky Rogers MRN: [id number] Date of Birth: [birth year] Today's Date: 01/21/2015  Pain: no c/o Skilled Therapeutic Interventions/Progress Updates: order received & chart reviewed.  Met with pt to discuss TR services including leisure interests, activity modification, safety concerns, & community pursuits.  Will monitor pt for community reintegration through team. [place] 01/21/2015, 4:10 PM

## 2015-01-21 NOTE — Progress Notes (Signed)
Occupational Therapy Session Note  Patient Details  Name: Becky Rogers MRN: [id number] Date of Birth: 1951-04-20  Today's Date: 01/21/2015 OT Individual Time: 0700-0800 OT Individual Time Calculation (min): 60 min    Short Term Goals: Week 1:  OT Short Term Goal 1 (Week 1): STGs equal to LTGs based on ELOS.  Skilled Therapeutic Interventions/Progress Updates:    Pt seen for 1:1 OT session with ADL retraining, functional mobility, sequencing, path-finding, working memory, standing balance, activity tolerance, and safety awareness. Pt received supine in bed agreeable to participate in ADL session. Pt completed bed mobility with supervision and ambulated to the bathroom with close supervision. Pt transferred to shower and completed shower in standing with close supervision. Pt completed one deep squat in shower to retrieve item with no LOB noted. Pt completed dressing seated in chair with min A with pt requiring assist from therapist for bra hook and sock management due to bandages on feet. Pt demonstrated decreased sequencing and problem-solving during ADL routine, consistently asking "what is next?" and requesting to put on shoes before donning LB clothing. Additionally, pt noted with decreased problem-solving with LB dressing with decreased realization of backwards clothing. Pt completed grooming at sink for apprx 5 minutes with no signs of fatigue. Pt then completed functional ambulation apprx 300'+ with close supervision-SBA. Pt demonstrated mild decreased balance with negotiating turn changes, but overall demonstrated good balance. Pt required mod cues for working memory and usage of signs to path-find back to room. Pt left seated in recliner with breakfast present and all other needs within reach.   Therapy Documentation Precautions:  Precautions Precautions: Fall Restrictions Weight Bearing Restrictions: No General:   Vital Signs: Therapy Vitals Temp: 99.4 F (37.4 C) Temp Source:  Oral Pulse Rate: 82 Resp: 18 BP: (!) 128/52 mmHg Patient Position (if appropriate): Lying Oxygen Therapy SpO2: 97 % O2 Device: Not Delivered Pain:   ADL:   Exercises:   Other Treatments:    See FIM for current functional status  Therapy/Group: Individual Therapy  Dorann Ou 01/21/2015, 9:00 AM

## 2015-01-21 NOTE — Progress Notes (Signed)
Speech Language Pathology Daily Session Note  Patient Details  Name: Becky Rogers MRN: [id number] Date of Birth: 1951-02-11  Today's Date: 01/21/2015 SLP Individual Time: 0900-1000 SLP Individual Time Calculation (min): 60 min  Short Term Goals: Week 1: SLP Short Term Goal 1 (Week 1): Patient will utilize external memory aids for orientation and recall of daily activities with Min A question and verbal cues.  SLP Short Term Goal 2 (Week 1): Patient will demonstrate functional problem solving for basic and familiar tasks with Min A verbal cues.  SLP Short Term Goal 3 (Week 1): Patient will self-monitor and correct errors during functional tasks with Min A verbal and question cues. SLP Short Term Goal 4 (Week 1): Patient will demonstrate selective attention to a functional task for 30 minutes with Min A verbal cues for redirection.  SLP Short Term Goal 5 (Week 1): Patient will utilize call bell to request assistance in 75% of observable opportunities with Min A question cues.   Skilled Therapeutic Interventions: Skilled treatment session focused on cognitive goals.  SLP facilitated session by initially providing Min A verbal cues for problem solving and organization during a mildly complex money management task, however, patient required Mod A multimodal cues by end of task. Patient independently recalled functions of her current medications and organized a 4 time per day pill box with extra time and Mod A verbal, question and visual cues for problem solving and self-monitoring and correcting errors.  Patient handed off to PT. Continue with current plan of care.    FIM:  Comprehension Comprehension Mode: Auditory Comprehension: 5-Understands basic 90% of the time/requires cueing < 10% of the time Expression Expression Mode: Verbal Expression: 5-Expresses basic 90% of the time/requires cueing < 10% of the time. Social Interaction Social Interaction: 5-Interacts appropriately 90% of the time  - Needs monitoring or encouragement for participation or interaction. Problem Solving Problem Solving: 3-Solves basic 50 - 74% of the time/requires cueing 25 - 49% of the time Memory Memory: 3-Recognizes or recalls 50 - 74% of the time/requires cueing 25 - 49% of the time  Pain Pain Assessment Pain Assessment: No/denies pain  Therapy/Group: Individual Therapy  Cienna Dumais 01/21/2015, 3:00 PM

## 2015-01-21 NOTE — Plan of Care (Signed)
Problem: RH Toileting Goal: LTG Patient will perform toileting w/assist, cues/equip (OT) LTG: Patient will perform toiletiing (clothes management/hygiene) with assist, with/without cues using equipment (OT)  Downgraded 7/12 due to cognitive deficits  Problem: RH Toilet Transfers Goal: LTG Patient will perform toilet transfers w/assist (OT) LTG: Patient will perform toilet transfers with assist, with/without cues using equipment (OT)  Downgraded 7/12 due to cognitive deficits

## 2015-01-21 NOTE — Progress Notes (Signed)
[place] PHYSICAL MEDICINE & REHABILITATION     PROGRESS NOTE    Subjective/Complaints: Up with therapy in the bathroom. Had uneventful night.   ROS: Pt denies fever, rash/itching, headache, blurred or double vision, nausea, vomiting, abdominal pain, diarrhea, chest pain, shortness of breath, palpitations, dysuria, dizziness, neck or back pain, bleeding, anxiety, or depression   Objective: Vital Signs: Blood pressure 128/52, pulse 82, temperature 99.4 F (37.4 C), temperature source Oral, resp. rate 18, height 5\' 2"  (1.575 m), weight 58.06 kg (128 lb), SpO2 97 %. No results found.  Recent Labs  01/19/15 0512  WBC 20.8*  HGB 10.4*  HCT 30.8*  PLT 184   No results for input(s): NA, K, CL, GLUCOSE, BUN, CREATININE, CALCIUM in the last 72 hours.  Invalid input(s): CO CBG (last 3)  No results for input(s): GLUCAP in the last 72 hours.  Wt Readings from Last 3 Encounters:  01/21/15 58.06 kg (128 lb)  01/16/15 59.2 kg (130 lb 8.2 oz)  08/22/14 51.256 kg (113 lb)    Physical Exam:  Constitutional: She is oriented to person, place, and time with cueing. She appears well-developed and well-nourished.  HENT:  Head: Normocephalic and atraumatic.  Right Ear: External ear normal.  Left Ear: External ear normal.  Mouth/Throat: Oropharynx is clear and moist.  Eyes: Conjunctivae are normal. Pupils are equal, round, and reactive to light.  Neck: Normal range of motion. Neck supple. No JVD present. No tracheal deviation present. No thyromegaly present.  Cardiovascular: Normal rate and regular rhythm. Exam reveals no friction rub.  No murmur heard. Respiratory: Effort normal. No respiratory distress. She has decreased breath sounds in the right lower field and the left lower field. She has no wheezes. She exhibits no tenderness.  GI: Soft. Bowel sounds are normal. She exhibits no distension. There is no tenderness. There is no rebound.  Genitourinary:  Left nephrostomy draining    yellow urine.  Musculoskeletal: She exhibits edema, tr to 1+ in legs. Minimal in arms   Neurological: She is alert and oriented to person, place, and time. No cranial nerve deficit. Coordination normal.  Oriented to situation, place, date with cues. Attention better today.  Able to follow basic one step commands without difficulty. UE's 3+ prox to 4/5 distally. LE's 2+hf, 3/5 ke and 3+ ankles. Senses pain and LT in both legs. No rigidity or tremor. Masked facies. Delayed processing. No sensory deficits  Skin: Skin is warm and dry.  Toes/finger blistering, evolving. No infection, mild drainage. minimally tender Psychiatric: She has a normal mood and affect. No agitation or anxiety   Assessment/Plan: 1. Functional deficits secondary to debility/encephalopathy after septic shock/prolonged hospital course which require 3+ hours per day of interdisciplinary therapy in a comprehensive inpatient rehab setting. Physiatrist is providing close team supervision and 24 hour management of active medical problems listed below. Physiatrist and rehab team continue to assess barriers to discharge/monitor patient progress toward functional and medical goals. FIM: FIM - Bathing Bathing Steps Patient Completed: Chest, Right Arm, Left Arm, Abdomen, Front perineal area, Buttocks, Right upper leg, Left upper leg, Right lower leg (including foot), Left lower leg (including foot) Bathing: 5: Supervision: Safety issues/verbal cues  FIM - Upper Body Dressing/Undressing Upper body dressing/undressing steps patient completed: Thread/unthread right bra strap, Thread/unthread left bra strap, Thread/unthread right sleeve of pullover shirt/dresss, Thread/unthread left sleeve of pullover shirt/dress, Put head through opening of pull over shirt/dress, Pull shirt over trunk Upper body dressing/undressing: 5: Set-up assist to: Obtain clothing/put away FIM -  Lower Body Dressing/Undressing Lower body dressing/undressing steps  patient completed: Thread/unthread right underwear leg, Thread/unthread left underwear leg, Pull underwear up/down, Thread/unthread left pants leg, Pull pants up/down, Thread/unthread right pants leg, Don/Doff left shoe, Don/Doff right shoe Lower body dressing/undressing: 4: Min-Patient completed 75 plus % of tasks (pt required assist with socks due to bandages covering feet)  FIM - Toileting Toileting steps completed by patient: Adjust clothing prior to toileting, Performs perineal hygiene, Adjust clothing after toileting Toileting Assistive Devices: Grab bar or rail for support Toileting: 4: Steadying assist  FIM - Radio producer Devices: Grab bars Toilet Transfers: 4-To toilet/BSC: Min A (steadying Pt. > 75%), 4-From toilet/BSC: Min A (steadying Pt. > 75%)  FIM - Bed/Chair Transfer Bed/Chair Transfer: 5: Bed > Chair or W/C: Supervision (verbal cues/safety issues), 5: Supine > Sit: Supervision (verbal cues/safety issues)  FIM - Locomotion: Wheelchair Locomotion: Wheelchair: 0: Activity did not occur FIM - Locomotion: Ambulation Locomotion: Ambulation Assistive Devices: Other (comment) (none) Ambulation/Gait Assistance: 5: Supervision Locomotion: Ambulation: 5: Travels 150 ft or more with supervision/safety issues  Comprehension Comprehension Mode: Auditory Comprehension: 5-Follows basic conversation/direction: With no assist  Expression Expression Mode: Verbal Expression: 5-Expresses basic needs/ideas: With extra time/assistive device  Social Interaction Social Interaction: 5-Interacts appropriately 90% of the time - Needs monitoring or encouragement for participation or interaction.  Problem Solving Problem Solving: 3-Solves basic 50 - 74% of the time/requires cueing 25 - 49% of the time  Memory Memory: 3-Recognizes or recalls 50 - 74% of the time/requires cueing 25 - 49% of the time  Medical Problem List and Plan: 1. Functional deficits  secondary to Encephalopathy and debility after sepsis 2. DVT Prophylaxis/Anticoagulation: Mechanical: Sequential compression devices, below knee Bilateral lower extremities  -TEDs, elevate legs 3. Pain Management: Tylenol prn for pain 4. Mood: LCSW to follow with patient and husband for support. 5. Neuropsych: This patient is not capable of making decisions on her own behalf.  -some improvement today  -no infectious or metabolic cause for change. dont think CT is needed at this point (one was just done a few days ago)-  -follow for clinical changes 6. Skin/Wound Care: Routine pressure relief measures. Encourage appropriate nutrition  -padding, local care between toes  -appreciate WOC RN eval---consvt mgt, keep areas dry 7. Fluids/Electrolytes/Nutrition: push po and supps 8. Left ureteral stone: Left nephrostomy tube in place with good UOP. 9. AKI: Resolving post L-nephrostomy tube. BUN/CR steadily improving. 10 Urosepsis due to E coli bacteremia/UTI: Continue antibiotics D # 11/ 14 days.  11. Hypokalemia: continue to replace with supplement 12. Leucocytosis: trending down,  -steroid effect  -wbc's trending down  -hgb in 10's--recheck tomorrow  -daily cbc 13. Parkinson's disease: On Sinemet 50/200 tid and artane bid  14. Acute respiratory failure: Resolved. Lasix still on hold due to AKI. Encourage IS.  15. Insomnia: klonopin.  16. Thrombocytopenia: platelets recovering---184k  LOS (Days) 5 A FACE TO FACE EVALUATION WAS PERFORMED  Jakarie Pember T 01/21/2015 8:32 AM

## 2015-01-21 NOTE — Patient Care Conference (Signed)
Inpatient RehabilitationTeam Conference and Plan of Care Update Date: 01/21/2015   Time: 2:45 PM    Patient Name: Becky Rogers      Medical Record Number: [id number]  Date of Birth: 10-18-1950 Sex: Female         Room/Bed: 4W20C/4W20C-01 Payor Info: Payor: Experiment / Plan: [employer] OTHER / Product Type: *No Product type* /    Admitting Diagnosis: DEBILITY  Admit Date/Time:  01/16/2015  6:19 PM Admission Comments: No comment available   Primary Diagnosis:  Debility Principal Problem: Debility  Patient Active Problem List   Diagnosis Date Noted  . Debility 01/16/2015  . Acute renal failure syndrome   . E. coli UTI   . Leukocytosis   . 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 Thrombocytopenia   . Metabolic encephalopathy   . Acute respiratory failure with hypoxia   . Parkinson's disease   . Altered mental status   . Encounter for central line placement   . Pulmonary infiltrates   . Abdominal pain, lower   . Hypoxia   . Pyelonephritis   . Pyonephrosis   . Septic shock 01/09/2015  . Abnormal mammogram of right breast 08/28/2014    Expected Discharge Date: Expected Discharge Date: 01/25/15  Team Members Present: Physician leading conference: Dr. Alger Simons Social Worker Present: Lennart Pall, LCSW Nurse Present: Heather Roberts, RN PT Present: Carney Living, PT OT Present: Gareth Morgan, OT SLP Present: Weston Anna, SLP PPS Coordinator present : Daiva Nakayama, RN, CRRN     Current Status/Progress Goal Weekly Team Focus  Medical   parkinsons disease. deconditioned after septic shock. multiple wounds  improve functional mobility and safety  wounds, cognition   Bowel/Bladder   L Nephrostomy tube, continent of bowel; LBM 7/11  continent of bowel and bladder  Up to bathroom with min assist   Swallow/Nutrition/ Hydration             ADL's   steadying A-close supervision   supervision-mod I   functional mobility, dynamic standing balance, activity  tolerance/endurance, safety awareness, cognitive remediation, pt/caregiver education    Mobility   supervision without device  mod I transfers and bed mobility, supervision due to cognitive deficits  standing balance, problem solving, sequencing, awareness, activity tolerance, safety, patient education   Communication             Safety/Cognition/ Behavioral Observations  Mod-Max A  Min A   orientation, problem solving, recall, awareness, attention   Pain   no c/o pain  <2 on a 0-10 scale  assess q 4hr and medicate as needed   Skin   Fluid filled blisters to finger tips, dunded skin between toes to bilateral feet with gauze dressing, foam to bilateral heels,   remain free from infection and additional breakdown while on rehab  assess skin q shift    Rehab Goals Patient on target to meet rehab goals: Yes *See Care Plan and progress notes for long and short-term goals.  Barriers to Discharge: safety, cognition    Possible Resolutions to Barriers:  supervision at discharge    Discharge Planning/Teaching Needs:  home with husband and daughters taking off a few days each from work;  considering private duty care vs adult day center due to cognition and 24/7 supervision needs longer term  ongoing   Team Discussion:  unually starts very strong in tx sessions then cognition declines 1/2 way through.  Pt able to acknowledge some deficits.  Will need some  RN follow up at home for skin/ blisters.  Supervision goals and must be 24/7.  SW reports that husband still trying to determine how he can manage this long term if that is the need.    Revisions to Treatment Plan:  None   Continued Need for Acute Rehabilitation Level of Care: The patient requires daily medical management by a physician with specialized training in physical medicine and rehabilitation for the following conditions: Daily direction of a multidisciplinary physical rehabilitation program to ensure safe treatment while eliciting  the highest outcome that is of practical value to the patient.: Yes Daily medical management of patient stability for increased activity during participation in an intensive rehabilitation regime.: Yes Daily analysis of laboratory values and/or radiology reports with any subsequent need for medication adjustment of medical intervention for : Post surgical problems;Neurological problems;Other  Nishka Heide 01/21/2015, 3:38 PM

## 2015-01-21 NOTE — Progress Notes (Signed)
Physical Therapy Session Note  Patient Details  Name: Becky Rogers MRN: [id number] Date of Birth: [birth year]  Today's Date: 01/21/2015 PT Individual Time: 1530-1630 PT Individual Time Calculation (min): 60 min   Short Term Goals: Week 1:  PT Short Term Goal 1 (Week 1): = LTGs due to anticipated LOS  Skilled Therapeutic Interventions/Progress Updates:   Session focused on path finding, functional problem solving, working memory, and community/outdoor ambulation. Patient ambulated throughout room and performed toilet transfer, clothing management, and hygiene with distant supervision. Performed path finding task throughout hospital to/from gift shop with initial min questioning cues to utilize, interpret, and follow external aids faded to mod-max questioning cues to return to rehab unit. [place] although she verbalized that her room was located on [address] on multiple occasions. Patient ambulated throughout hospital, on/off elevators, over thresholds, and navigated tight spaces in carpeted gift shop and outdoors on inclines/declines, uneven surfaces, up/down flight of brick stairs using 1 rail, up/down curb step, and on grass without device with supervision-mod I and no LOB. Before outdoor ambulation, patient verbally given list of 3 items to locate in gift shop. Following outdoor ambulation, patient able to recall only 1/3 items with more than reasonable amount of time and required mod questioning cues to recall/locate other items. Patient required only one seated rest break throughout session. During rest break, discussed discharge planning and recommendations for f/u OPPT and 24/7 supervision due to cognitive impairments. Patient verbalized understanding and in agreement with recommendations. She reported that multiple friends have offered to assist patient and family at discharge. Patient reports difficulty with "math problems" to be most limiting factor  and states that physically she is back to baseline. Upon returning to room patient left sitting in recliner with BLE elevated, quick release belt on, and all needs within reach.   Therapy Documentation Precautions:  Precautions Precautions: Fall Restrictions Weight Bearing Restrictions: No Pain: Pain Assessment Pain Assessment: No/denies pain  See FIM for current functional status  Therapy/Group: Individual Therapy  Laretta Alstrom 01/21/2015, 4:42 PM

## 2015-01-21 NOTE — Progress Notes (Signed)
Social Work  Social Work Assessment and Plan  Patient Details  Name: Becky Rogers MRN: [id number] Date of Birth: 10-23-1950  Today's Date: 01/19/2015  Problem List:  Patient Active Problem List   Diagnosis Date Noted  . Debility 01/16/2015  . Acute renal failure syndrome   . E. coli UTI   . Leukocytosis   . 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 Thrombocytopenia   . Metabolic encephalopathy   . Acute respiratory failure with hypoxia   . Parkinson's disease   . Altered mental status   . Encounter for central line placement   . Pulmonary infiltrates   . Abdominal pain, lower   . Hypoxia   . Pyelonephritis   . Pyonephrosis   . Septic shock 01/09/2015  . Abnormal mammogram of right breast 08/28/2014   Past Medical History:  Past Medical History  Diagnosis Date  . Hypertension   . Hypercholesteremia   . Parkinson disease   . Breast cancer     right   Past Surgical History:  Past Surgical History  Procedure Laterality Date  . Cesarean section      x2  . Ovarian cyst removal      age 39  . Appendectomy    . Colonoscopy    . Dental surgery      implant  . Breast lumpectomy with radioactive seed localization Right 08/28/2014    Procedure: RIGHT BREAST LUMPECTOMY WITH RADIOACTIVE SEED LOCALIZATION;  Surgeon: [name], MD;  Location: [facility];  Service: General;  Laterality: Right;   Social History:  reports that she has never smoked. She does not have any smokeless tobacco history on file. She reports that she drinks alcohol. She reports that she does not use illicit drugs.  Family / Support Systems Marital Status: Married How Long?: 40 yrs Patient Roles: Spouse, Parent Spouse/Significant Other: husband, Becky Rogers @ (H) [phone] or (C) [phone] Children: they have two adult daughters:  Becky Rogers) and Becky Cruz ([place]) - no local family Anticipated Caregiver: husband, family, neighbors Ability/Limitations of Caregiver: Husband  worksdays.  He describes his boss as, currently, being flexible with his hours but he does not anticipate that this will continue very long.  Feels he will only be able to take a few days off of work once pt is d/c'd. Caregiver Availability: Other (Comment) (husband to work on 24/7 assist) Family Dynamics: Husband very attentive and notes that their daughters are also as supportive as they are able to be.  Anticipates that they might be able to take "a few days off" after d/c as well.  Social History Preferred language: English Religion: Lutheran Cultural Background: NA Education: college Read: Yes Write: Yes Employment Status: Retired Date Retired/Disabled/Unemployed: approx 12-15 months ago Freight forwarder Issues: None Guardian/Conservator: None - per MD, pt not yet capable of making decisions on her own behalf - defer to spouse.   Abuse/Neglect Physical Abuse: Denies Verbal Abuse: Denies Sexual Abuse: Denies Exploitation of patient/patient's resources: Denies Self-Neglect: Denies  Emotional Status Pt's affect, behavior adn adjustment status: Pt very pleasant and willing to attempt to complete assessment interview with me.  Smiling easily but can easily become frustrated when she feels the information she is providing is not correct.  Able to confirm with her husband that most of her answers were accurate.  Occasionally she does become somewhat tangential or state obvious falsehoods.  i.e. husband reports that she was insistent earlier in the day that she was at  work and her former boss was in the hallway.  Pt admits she is worried about her cognitivte deficits.  She does have the ability (most of the time) to recognize where her statements do not make sense.  She denies any s/s of depression, however, admits that "I might start getting that way ,though..." due to her further progression of cognitive deficts  Will refer to neuropsychology for coping and cognitive evaluations -  to complete formatl depression screen if they feel appropriate. Recent Psychosocial Issues: Pt and husband report that pt received diagnosis of Parkinson's Dz less than 2 yrs ago;  note that she was "having some slowing (cognitive)", per husband, but now "it's much, much worse" Pyschiatric History: None Substance Abuse History: None  Patient / Family Perceptions, Expectations & Goals Pt/Family understanding of illness & functional limitations: Pt able to report that she became ill with "an infection in my kidneys' and that this was the cause of her medical crisis that tipped off further physical and cognitive decline.  Husband also with good understanding of the medical issues she has faced since admit and of current level of functional limitations. Premorbid pt/family roles/activities: Pt was independent PTA.  Husband reports that she had recently driven to [place] when daughter had surgery (3 wks PTA).  Notes she was showing some cognitive decline, however, none that made him feel she couldn't manage at home alone while he was at work. Anticipated changes in roles/activities/participation: Anticipate significant changes as it appears pt will require 24/7 supervision at a minimum at d/c and uncertain if this might be a lifetime need.  Husband very concerned about this and how he will manage as he "must work" Pt/family expectations/goals: "I want to get better.  I want to get myself back."    [employer]: None Premorbid Home Care/DME Agencies: None Transportation available at discharge: yes Resource referrals recommended: Neuropsychology, Support group (specify)  Discharge Planning Support Systems: Spouse/significant other, Children, Friends/neighbors Type of Residence: Private residence Insurance Resources: Multimedia programmer (specify) Nurse, mental health) Financial Resources: SSD (employer's LTD) Financial Screen Referred: No Living Expenses: Higher education careers adviser Management:  Spouse Does the patient have any problems obtaining your medications?: No Home Management: Pt and spouse shared responsibilities PTA Patient/Family Preliminary Plans: Pt to return home with husband as primary support.  May hire private duty assistance or pursue Adult Day Care. Barriers to Discharge: Other (Comment) (financially stressed in terms of privately hiring care) Social Work Anticipated Follow Up Needs: HH/OP, Support Group, Other (comment) (?Adult [place]) Expected length of stay: 11-15 days  Clinical Impression Very unfortunate woman here following uroseptic event and with h/o Parkinson's and reported cognitive "slowing" over the past year.  Now with further cognitive and physical impairment and anticipated she will require 24/7 assistance upon d/c.  Husband is only local support and must work f/t.  Financial stressor to hire private duty caregiver.  Discussing costs of private care vs adult day care set up.  Will follow for support and d/c planning.  Becky Rogers 01/19/2015, 9:22 AM

## 2015-01-22 ENCOUNTER — Inpatient Hospital Stay (HOSPITAL_COMMUNITY): Payer: BLUE CROSS/BLUE SHIELD | Admitting: Physical Therapy

## 2015-01-22 ENCOUNTER — Inpatient Hospital Stay (HOSPITAL_COMMUNITY): Payer: BLUE CROSS/BLUE SHIELD | Admitting: Speech Pathology

## 2015-01-22 ENCOUNTER — Inpatient Hospital Stay (HOSPITAL_COMMUNITY): Payer: BLUE CROSS/BLUE SHIELD

## 2015-01-22 LAB — CBC
HEMATOCRIT: 29.2 % — AB (ref 36.0–46.0)
HEMOGLOBIN: 9.8 g/dL — AB (ref 12.0–15.0)
MCH: 29.3 pg (ref 26.0–34.0)
MCHC: 33.6 g/dL (ref 30.0–36.0)
MCV: 87.4 fL (ref 78.0–100.0)
PLATELETS: 331 10*3/uL (ref 150–400)
RBC: 3.34 MIL/uL — ABNORMAL LOW (ref 3.87–5.11)
RDW: 14 % (ref 11.5–15.5)
WBC: 10 10*3/uL (ref 4.0–10.5)

## 2015-01-22 LAB — BASIC METABOLIC PANEL
Anion gap: 7 (ref 5–15)
BUN: 22 mg/dL — ABNORMAL HIGH (ref 6–20)
CHLORIDE: 108 mmol/L (ref 101–111)
CO2: 26 mmol/L (ref 22–32)
Calcium: 7.3 mg/dL — ABNORMAL LOW (ref 8.9–10.3)
Creatinine, Ser: 0.83 mg/dL (ref 0.44–1.00)
GLUCOSE: 91 mg/dL (ref 65–99)
Potassium: 2.8 mmol/L — ABNORMAL LOW (ref 3.5–5.1)
Sodium: 141 mmol/L (ref 135–145)

## 2015-01-22 MED ORDER — POTASSIUM CHLORIDE CRYS ER 20 MEQ PO TBCR
40.0000 meq | EXTENDED_RELEASE_TABLET | Freq: Three times a day (TID) | ORAL | Status: DC
  Administered 2015-01-22 (×3): 40 meq via ORAL
  Filled 2015-01-22 (×7): qty 2

## 2015-01-22 NOTE — Progress Notes (Signed)
Occupational Therapy Session Note  Patient Details  Name: Becky Rogers MRN: [id number] Date of Birth: 07-07-1951  Today's Date: 01/22/2015 OT Individual Time: 0900-1000 and 1130-1200 OT Individual Time Calculation (min): 60 min and 30 min   Short Term Goals: Week 1:  OT Short Term Goal 1 (Week 1): STGs equal to LTGs based on ELOS.  Skilled Therapeutic Interventions/Progress Updates:    Session 1: Pt seen for 1:1 OT session with a focus on ADL retraining, functional ambulation, sequencing, cognitive remediation, activity tolerance, safety awareness, and patient education. Pt received seated in recliner agreeable to participate in session and no complaint of pain this session. Pt declined shower this AM due to washing up with husband last night. Pt completed dressing sit<>stand from recliner with set-up assist and min A for donning socks due to dressing on BLE. Noted continued decreased sequencing & problem-solving during dressing, with pt putting on socks/shoes before donning shorts. Pt without realization of sequencing error until time to don shorts. Pt completed functional ambulation to ADL kitchen for apprx 200' with supervision. Pt engaged in kitchen organization task with 10 items. Pt required mod cues for task and noted decrease in success with task about 50% of the way through as fatigue increased. Pt then ambulated back to room and left seated in recliner with friend present. QRB donned and pt left with all needs in reach.   Session 2: Pt seen for 1:1 OT session with a focus on functional ambulation, path-finding, working memory, activity tolerance, and safety awareness. Pt completed scavenger hunt activity requiring pt to remember to locate 5 items within a designated route.  Pt able to successfully locate 4/5 items with repetition of list 2x and min cues. At the end of task, pt required to verbally repeat 5 items and required max cues to successfully complete. Therapist educated pt on  compensatory strategies to use for memory and pt then able to name 5/5 items with strategy use. Pt required mod cues for working memory throughout tasks. Pt demonstrated increased usage of signs to path-find back to room, but noted distraction with signs around hospital. Pt and therapist completed written list of tasks performed in session with mod cues. Pt left seated in recliner with lunch present. QRB donned and pt left with all other needs in reach.   Therapy Documentation Precautions:  Precautions Precautions: Fall Restrictions Weight Bearing Restrictions: No General:   Vital Signs:  Pain:   ADL:   Exercises:   Other Treatments:    See FIM for current functional status  Therapy/Group: Individual Therapy  Dorann Ou 01/22/2015, 10:03 AM

## 2015-01-22 NOTE — Progress Notes (Signed)
Speech Language Pathology Daily Session Note  Patient Details  Name: TAWNIA SCHIRM MRN: [id number] Date of Birth: 1950/08/19  Today's Date: 01/22/2015 SLP Individual Time: 0800-0900 SLP Individual Time Calculation (min): 60 min  Short Term Goals: Week 1: SLP Short Term Goal 1 (Week 1): Patient will utilize external memory aids for orientation and recall of daily activities with Min A question and verbal cues.  SLP Short Term Goal 2 (Week 1): Patient will demonstrate functional problem solving for basic and familiar tasks with Min A verbal cues.  SLP Short Term Goal 3 (Week 1): Patient will self-monitor and correct errors during functional tasks with Min A verbal and question cues. SLP Short Term Goal 4 (Week 1): Patient will demonstrate selective attention to a functional task for 30 minutes with Min A verbal cues for redirection.  SLP Short Term Goal 5 (Week 1): Patient will utilize call bell to request assistance in 75% of observable opportunities with Min A question cues.   Skilled Therapeutic Interventions: Skilled treatment session focused on cognitive goals. Upon arrival, patient asked to use the bathroom and performed all self-care tasks with supervision in regards to safety and problem solving. SLP facilitated session by providing Min-Mod A verbal and question cues for functional problem solving and organization during a mild-moderately complex scheduling and comparing/contrasting task. Patient demonstrated selective attention to task for 45 minutes with Mod I in a mildly distracting environment and was oriented to place, time and situation with Mod I.  Patient also utilized her schedule to anticipate upcoming therapy sessions with Min A question cues. Patient left upright in recliner with quick release belt in place and all needs within reach. Continue with current plan of care.    FIM:  Comprehension Comprehension Mode: Auditory Comprehension: 4-Understands basic 75 - 89% of the  time/requires cueing 10 - 24% of the time Expression Expression Mode: Verbal Expression: 5-Expresses complex 90% of the time/cues < 10% of the time Social Interaction Social Interaction: 5-Interacts appropriately 90% of the time - Needs monitoring or encouragement for participation or interaction. Problem Solving Problem Solving: 4-Solves basic 75 - 89% of the time/requires cueing 10 - 24% of the time Memory Memory: 3-Recognizes or recalls 50 - 74% of the time/requires cueing 25 - 49% of the time  Pain Pain Assessment Pain Assessment: No/denies pain  Therapy/Group: Individual Therapy  Jarret Torre 01/22/2015, 4:01 PM

## 2015-01-22 NOTE — Progress Notes (Signed)
[facility] PHYSICAL MEDICINE & REHABILITATION     PROGRESS NOTE    Subjective/Complaints: Sitting in chair, eating. Denies pain in her fingers or toes except when she bumps them. Feels that she's gradually improving.   ROS: Pt denies fever, rash/itching, headache, blurred or double vision, nausea, vomiting, abdominal pain, diarrhea, chest pain, shortness of breath, palpitations, dysuria, dizziness, neck or back pain, bleeding, anxiety, or depression   Objective: Vital Signs: Blood pressure 141/56, pulse 65, temperature 98.4 F (36.9 C), temperature source Oral, resp. rate 18, height 5\' 2"  (1.575 m), weight 58.06 kg (128 lb), SpO2 97 %. No results found.  Recent Labs  01/22/15 0610  WBC 10.0  HGB 9.8*  HCT 29.2*  PLT 331    Recent Labs  01/22/15 0610  NA 141  K 2.8*  CL 108  GLUCOSE 91  BUN 22*  CREATININE 0.83  CALCIUM 7.3*   CBG (last 3)  No results for input(s): GLUCAP in the last 72 hours.  Wt Readings from Last 3 Encounters:  01/21/15 58.06 kg (128 lb)  01/16/15 59.2 kg (130 lb 8.2 oz)  08/22/14 51.256 kg (113 lb)    Physical Exam:  Constitutional: She is oriented to person, place, and time with cueing. She appears well-developed and well-nourished.  HENT:  Head: Normocephalic and atraumatic.  Right Ear: External ear normal.  Left Ear: External ear normal.  Mouth/Throat: Oropharynx is clear and moist.  Eyes: Conjunctivae are normal. Pupils are equal, round, and reactive to light.  Neck: Normal range of motion. Neck supple. No JVD present. No tracheal deviation present. No thyromegaly present.  Cardiovascular: Normal rate and regular rhythm. Exam reveals no friction rub.  No murmur heard. Respiratory: Effort normal. No respiratory distress. She has decreased breath sounds in the right lower field and the left lower field. She has no wheezes. She exhibits no tenderness.  GI: Soft. Bowel sounds are normal. She exhibits no distension. There is no  tenderness. There is no rebound.  Genitourinary:  Left nephrostomy draining  Musculoskeletal: She exhibits edema, tr to 1+ in legs. Minimal in arms   Neurological: She is alert and oriented to person, place, and time. No cranial nerve deficit. Coordination normal.  Oriented to situation, place, date with cues. Attention and awareness improving.  Able to follow basic one step commands without difficulty. UE's 3+ prox to 4/5 distally. LE's 2+hf, 3/5 ke and 3+ ankles. Senses pain and LT in both legs. No rigidity or tremor. Masked facies.   No sensory deficits  Skin: Skin is warm and dry.  Toes/finger blistering, evolving. No infection, mild drainage. minimally tender Psychiatric: She has a normal mood and affect. No agitation or anxiety   Assessment/Plan: 1. Functional deficits secondary to debility/encephalopathy after septic shock/prolonged hospital course which require 3+ hours per day of interdisciplinary therapy in a comprehensive inpatient rehab setting. Physiatrist is providing close team supervision and 24 hour management of active medical problems listed below. Physiatrist and rehab team continue to assess barriers to discharge/monitor patient progress toward functional and medical goals.  FIM: FIM - Bathing Bathing Steps Patient Completed: Chest, Right Arm, Left Arm, Abdomen, Front perineal area, Buttocks, Right upper leg, Left upper leg, Right lower leg (including foot), Left lower leg (including foot) Bathing: 5: Supervision: Safety issues/verbal cues  FIM - Upper Body Dressing/Undressing Upper body dressing/undressing steps patient completed: Thread/unthread right bra strap, Thread/unthread left bra strap, Thread/unthread right sleeve of pullover shirt/dresss, Thread/unthread left sleeve of pullover shirt/dress, Put head through opening  of pull over shirt/dress, Pull shirt over trunk Upper body dressing/undressing: 4: Min-Patient completed 75 plus % of tasks FIM - Lower Body  Dressing/Undressing Lower body dressing/undressing steps patient completed: Thread/unthread right underwear leg, Thread/unthread left underwear leg, Pull underwear up/down, Thread/unthread left pants leg, Pull pants up/down, Thread/unthread right pants leg, Don/Doff left shoe, Don/Doff right shoe Lower body dressing/undressing: 4: Min-Patient completed 75 plus % of tasks (pt required assist with socks due to bandages covering feet)  FIM - Toileting Toileting steps completed by patient: Adjust clothing prior to toileting, Performs perineal hygiene, Adjust clothing after toileting Toileting Assistive Devices: Grab bar or rail for support Toileting: 4: Steadying assist  FIM - Radio producer Devices: Grab bars Toilet Transfers: 4-To toilet/BSC: Min A (steadying Pt. > 75%), 4-From toilet/BSC: Min A (steadying Pt. > 75%)  FIM - Bed/Chair Transfer Bed/Chair Transfer: 5: Bed > Chair or W/C: Supervision (verbal cues/safety issues)  FIM - Locomotion: Wheelchair Locomotion: Wheelchair: 0: Activity did not occur FIM - Locomotion: Ambulation Locomotion: Ambulation Assistive Devices: Other (comment) (none) Ambulation/Gait Assistance: 5: Supervision Locomotion: Ambulation: 5: Travels 150 ft or more with supervision/safety issues  Comprehension Comprehension Mode: Auditory Comprehension: 5-Understands basic 90% of the time/requires cueing < 10% of the time  Expression Expression Mode: Verbal Expression: 5-Expresses basic 90% of the time/requires cueing < 10% of the time.  Social Interaction Social Interaction: 5-Interacts appropriately 90% of the time - Needs monitoring or encouragement for participation or interaction.  Problem Solving Problem Solving: 3-Solves basic 50 - 74% of the time/requires cueing 25 - 49% of the time  Memory Memory: 3-Recognizes or recalls 50 - 74% of the time/requires cueing 25 - 49% of the time  Medical Problem List and Plan: 1.  Functional deficits secondary to Encephalopathy and debility after sepsis  -will need supervision at home after discharge, but i don't suspect it will be long term. 2. DVT Prophylaxis/Anticoagulation: Mechanical: Sequential compression devices, below knee Bilateral lower extremities  -TEDs, elevate legs 3. Pain Management: Tylenol prn for pain 4. Mood: LCSW to follow with patient and husband for support. 5. Neuropsych: This patient is not capable of making decisions on her own behalf.  -gradual albeit not consistent improvement  -no infectious or metabolic cause for change. dont think CT is needed at this point (one was just done a few days ago)-  -follow for clinical changes 6. Skin/Wound Care: Routine pressure relief measures. Encourage appropriate nutrition  -padding, local care between toes, keep clean  -appreciate WOC RN eval---consvt mgt, keep areas dry 7. Fluids/Electrolytes/Nutrition: push po and supps 8. Left ureteral stone: Left nephrostomy tube in place with good UOP. 9. AKI: Resolving post L-nephrostomy tube. BUN/CR continue to improve 10 Urosepsis due to E coli bacteremia/UTI: Continue antibiotics D # 12/ 14 days.  11. Hypokalemia: needs further replacement today 12. Leucocytosis: I personally reviewed the patient's labs today.  -steroid effect  -wbc's now within normal limits  -hgb 9.8  -daily cbc 13. Parkinson's disease: On Sinemet 50/200 tid and artane bid  14. Acute respiratory failure: Resolved. Lasix still on hold due to AKI. Encourage IS.  15. Insomnia: klonopin.  16. Thrombocytopenia: platelets recovering---184k  LOS (Days) 6 A FACE TO FACE EVALUATION WAS PERFORMED  SWARTZ,ZACHARY T 01/22/2015 8:21 AM

## 2015-01-22 NOTE — Progress Notes (Addendum)
Physical Therapy Session Note  Patient Details  Name: Becky Rogers MRN: [id number] Date of Birth: 09/25/1950  Today's Date: 01/22/2015 PT Individual Time: 1305-1405 PT Individual Time Calculation (min): 60 min   Short Term Goals: Week 1:  PT Short Term Goal 1 (Week 1): = LTGs due to anticipated LOS  Skilled Therapeutic Interventions/Progress Updates:  Session focused on problem solving, memory, command following, and activity tolerance. Pt received in recliner with quick release belt and legs elevated. Pt ambulated throughout rehab unit over 1000 ft with no signs of fatigue requiring supervision. Pt performed functional task with focus on problem solving, working memory, organization and sequencing while finding numbered targets in order. Pt able to complete task with more than reasonable amount of time and max multimodal cuing to identify targets location. Despite constant cuing pt continued to look in other places for targets. Pt not able to recall where previous targets were located and would have to search entire rehab unit to find the next appropriate one. Pt required supervision with deep squat to retrieve targets. Pt performed dual task dynamic standing and cognitive activity with mod cuing for sequencing stepping to different targets with multistep commands. Pt needed max cuing for division problem of "divisors of 12." Pt left sitting in chair with friend present and all needs within reach. RN notified of skin tear to middle digit after toileting.  Therapy Documentation Precautions:  Precautions Precautions: Fall Restrictions Weight Bearing Restrictions: No Pain: Pain Assessment Pain Assessment: No/denies pain  See FIM for current functional status  Therapy/Group: Individual Therapy  Elsie Ra 01/22/2015, 5:24 PM

## 2015-01-22 NOTE — Progress Notes (Signed)
Social Work Patient ID: Becky Rogers, female   DOB: [birth year], 64 y.o.   MRN: [id number]   Met with pt and her husband this afternoon to review team conference.  Both aware and agreeable with targeted d/c date of 7/16 and supervision goals.  Husband to be here tomorrow to meet with Dr. Beverly Gust to review testing results.  Husband feels family can provide 24/7 supervision for approx 1-2 weeks and will consider private duty or Adult Day Care as needed.  Continue to follow.  Marline Morace, LCSW

## 2015-01-23 ENCOUNTER — Inpatient Hospital Stay (HOSPITAL_COMMUNITY): Payer: BLUE CROSS/BLUE SHIELD | Admitting: Physical Therapy

## 2015-01-23 ENCOUNTER — Encounter (HOSPITAL_COMMUNITY): Payer: BLUE CROSS/BLUE SHIELD

## 2015-01-23 ENCOUNTER — Inpatient Hospital Stay (HOSPITAL_COMMUNITY): Payer: BLUE CROSS/BLUE SHIELD

## 2015-01-23 ENCOUNTER — Inpatient Hospital Stay (HOSPITAL_COMMUNITY): Payer: BLUE CROSS/BLUE SHIELD | Admitting: Speech Pathology

## 2015-01-23 LAB — BASIC METABOLIC PANEL
Anion gap: 6 (ref 5–15)
BUN: 18 mg/dL (ref 6–20)
CO2: 26 mmol/L (ref 22–32)
Calcium: 7.6 mg/dL — ABNORMAL LOW (ref 8.9–10.3)
Chloride: 112 mmol/L — ABNORMAL HIGH (ref 101–111)
Creatinine, Ser: 0.63 mg/dL (ref 0.44–1.00)
GFR calc Af Amer: >60 mL/min (ref >60)
GLUCOSE: 83 mg/dL (ref 65–99)
Potassium: 3.7 mmol/L (ref 3.5–5.1)
SODIUM: 144 mmol/L (ref 135–145)

## 2015-01-23 MED ORDER — POTASSIUM CHLORIDE CRYS ER 20 MEQ PO TBCR
20.0000 meq | EXTENDED_RELEASE_TABLET | Freq: Every day | ORAL | Status: DC
  Administered 2015-01-24: 20 meq via ORAL
  Filled 2015-01-23 (×2): qty 1

## 2015-01-23 NOTE — Progress Notes (Signed)
Speech Language Pathology Daily Session Note  Patient Details  Name: Becky Rogers MRN: [id number] Date of Birth: [birth year]  Today's Date: 01/23/2015 SLP Individual Time: 1000-1100 SLP Individual Time Calculation (min): 60 min  Short Term Goals: Week 1: SLP Short Term Goal 1 (Week 1): Patient will utilize external memory aids for orientation and recall of daily activities with Min A question and verbal cues.  SLP Short Term Goal 2 (Week 1): Patient will demonstrate functional problem solving for basic and familiar tasks with Min A verbal cues.  SLP Short Term Goal 3 (Week 1): Patient will self-monitor and correct errors during functional tasks with Min A verbal and question cues. SLP Short Term Goal 4 (Week 1): Patient will demonstrate selective attention to a functional task for 30 minutes with Min A verbal cues for redirection.  SLP Short Term Goal 5 (Week 1): Patient will utilize call bell to request assistance in 75% of observable opportunities with Min A question cues.   Skilled Therapeutic Interventions: Skilled treatment session focused on cognitive goals. SLP facilitated session by providing Min A question cues for recall of events from previous therapy sessions and required Mod A verbal cues to write down information on schedule to increase recall and carryover.  SLP also facilitated session by providing Max A multimodal cues for organization and problem solving during a mildly complex but familiar task from yesterday's therapy session.  Patient independently demonstrated appropriate emergent awareness in regards to increased difficulty with task today compared to yesterday. Patient demonstrated selective attention to task for 45 minutes with Min A verbal cues for redirection. Patient's husband present at end of session and educated on patient's current cognitive function and goals of skilled SLP intervention. Patient left upright in recliner with quick release belt in place and all needs  within reach. Continue with current plan of care.    FIM:  Comprehension Comprehension Mode: Auditory Comprehension: 4-Understands basic 75 - 89% of the time/requires cueing 10 - 24% of the time Expression Expression Mode: Verbal Expression: 5-Expresses complex 90% of the time/cues < 10% of the time Social Interaction Social Interaction: 4-Interacts appropriately 75 - 89% of the time - Needs redirection for appropriate language or to initiate interaction. Problem Solving Problem Solving: 4-Solves basic 75 - 89% of the time/requires cueing 10 - 24% of the time Memory Memory: 3-Recognizes or recalls 50 - 74% of the time/requires cueing 25 - 49% of the time  Pain Pain Assessment Pain Assessment: No/denies pain  Therapy/Group: Individual Therapy  Sascha Palma 01/23/2015, 4:05 PM

## 2015-01-23 NOTE — Progress Notes (Signed)
Occupational Therapy Session Note  Patient Details  Name: AURIANA SCALIA MRN: [id number] Date of Birth: 20-Jan-1951  Today's Date: 01/23/2015 OT Individual Time: 1400-1430 OT Individual Time Calculation (min): 30 min    Short Term Goals: Week 1:  OT Short Term Goal 1 (Week 1): STGs equal to LTGs based on ELOS.  Skilled Therapeutic Interventions/Progress Updates:    Pt seen for 1:1 OT session with a focus on functional ambulation, sequencing, problem-solving, working memory, safety awareness, activity tolerance, path-finding, and patient education. Pt received in hallway from PT finishing session. Pt ambulated to ADL apartment with supervision. Pt engaged in homemaking task of setting the kitchen table for 3 people. Pt demonstrated max difficulty with task, particularly components of sequencing, working memory, and problem-solving. Pt required max cues to complete task and noted increased difficulty with fatigue. Pt ambulated back to room at supervision level. Pt required mod cues to path-find back to room, due to difficulty utilizing signs in hallway. Pt returned to room and therapist educated pt on ways to compensate for sequencing and memory deficits and strategies to implement at home. Pt left seated in w/c with QRB donned and all other needs within reach.   Therapy Documentation Precautions:  Precautions Precautions: Fall Restrictions Weight Bearing Restrictions: No General:   Vital Signs: Therapy Vitals Temp: 98.2 F (36.8 C) Temp Source: Oral Pulse Rate: 75 Resp: 17 BP: 134/66 mmHg Patient Position (if appropriate): Sitting Oxygen Therapy SpO2: 100 % O2 Device: Not Delivered Pain:   ADL:   Exercises:   Other Treatments:    See FIM for current functional status  Therapy/Group: Individual Therapy  Dorann Ou 01/23/2015, 3:09 PM

## 2015-01-23 NOTE — Consult Note (Signed)
NEUROCOGNITIVE [place]   Ms. Desa Rech is a 64 year old woman, who is familiar to this neuropsychologist, as she was seen as an outpatient for a comprehensive neuropsychological evaluation in June, 2015.  At that time, she presented with cognitive complaints across multiple domains occurring over the prior 5 months.  In brief, that evaluation revealed selected impairments consistent with a diagnosis of Mild Cognitive Impairment (MCI).  It was opined that her cognitive weaknesses could be secondary to Parkinson's disease or Lewy Body Disease, though it was also noted that given that she demonstrated only 3 impairments, her reduced scores could also have been incidental. It was recommended that she return for a follow-up evaluation in 1 year.  According to her medical record, she was admitted to [facility] on 01/09/15 with abdominal pain, acute renal failure and hypotension due to urosepsis.  She was treated with fluids and antibiotics for septic shock and she underwent left percutaneous nephrostomy for concerns about obstructed pyonephrosis.  Her acute renal failure was documented to be slowly improving and her thrombocytopenia was resolving.  She was referred for the current neuropsychological evaluation owing to possible cognitive changes.     PROCEDURES: [3 units of 36144 on 01/20/2015]  The following tests were performed during today's visit: Mini Mental Status Examination (brief version), Repeatable Battery for the Assessment of Neuropsychological Status (RBANS, form A), Beck Depression Inventory (short-form for medical patients).  Performance validity measures were also administered.  Test results are as follows:   MMSE-2 (brief red) Raw Score = 10/16 Description = Impaired   RBANS Indices Scaled Score Percentile Description  Immediate Memory  49 < 1 Profoundly Impaired  Visuospatial/Constructional 50 < 1 Profoundly Impaired  Language  78 7 Impaired  Attention 72 3 Impaired  Delayed Memory 40 < 1 Profoundly Impaired  Total Score 51 < 1 Profoundly Impaired   RBANS Subtests Raw Score Percentile Description  List Learning 12 < 1 Profoundly Impaired  Story Memory 5 < 1 Profoundly Impaired  Figure Copy 10 < 1 Profoundly Impaired  Line Orientation 5 < 1 Profoundly Impaired  Picture Naming 9 8 Impaired  Semantic Fluency 7 < 1 Profoundly Impaired  Digit Span 10 45 Average  Coding 0 < 1 Profoundly Impaired  List Recall 0 < 1 Profoundly Impaired  List Recognition 12 < 1 Profoundly Impaired  Story Recall 1 < 1 Profoundly Impaired  Figure recall 3 < 1 Profoundly Impaired   BDI (short form) Raw Score = 4 Description = Mild   Ms. Mehlman's performances on embedded and objective measures of performance validity were below expectation, but seemed to be impacted by genuine cognitive disruption, particularly as there were no behavioral manifestations to suggest suboptimal effort.  Therefore, results are likely to be a valid estimation of her current level of cognitive abilities.    Ms. Prouse test scores revealed impairments across domains, with one exception.  The only score that was not impaired was one on a subtest requiring simple confrontation naming.  All of her other performances were impaired.  Subjectively, Ms. Leavy acknowledged worsening cognitive functioning over the past year, without major fluctuation day-to-day, including word-finding trouble and forgetfulness.  She commented that it is difficult for her to hold more than 2-3 things in her head at once.  She stated that she has continued to have vivid dreams with hypnopompic hallucinations, but she denied experiencing visual hallucinations.    From an emotional standpoint, Ms. Anagnos endorsed symptom suggestive of  mild depressed mood and she acknowledged feeling depressed, particularly since being in the hospital.  She clarified that she often has thoughts about "why me?"  when considering her current medical situation.    IMPRESSIONS:  Ms. Akey overall neurocognitive profile included mostly impaired scores, at the level of a Major Neurocognitive Disorder.  In comparison to prior test results, her current scores represent a stark decline over the past year in almost every area.  The exact etiology of her deficits is less clear and could be multifactorial, including potential contribution from Parkinson's disease, recent infection, and kidney disease.  We also continue to be unable to fully rule out dementia with Lewy Bodies, though owing to her recent reported history and lack of fluctuating cognitive symptoms, this is less likely.  Although she endorsed a few symptoms of depression, which could be exacerbating cognitive problems, it is unlikely that mood disruption alone is the driving force behind her cognitive dysfunction.  Given that a significant portion of her cognitive disruption could be secondary to recent infection or other disease processes that are being treated, it is possible that her cognitive functioning could recover to some extent over time.  Serial re-evaluation will be beneficial in evaluating for interval change in order to continue to provide recommendations for care management.   In light of these findings, the following recommendations are provided.    RECOMMENDATIONS:  Recommendations for treatment team:   . When interacting with Ms. Blansett, directions and information should be provided in a simple, straight forward manner, and the treatment team should avoid giving multiple instructions simultaneously.  . Ms. Duley may also benefit from being provided with multiple trials to learn new skills given the noted memory inefficiencies.  . Since emotional factors are likely adversely impacting the patient's daily life, brief counseling for social support seems warranted during this hospitalization.  . To the extent possible, multitasking should be  avoided. . Ms. Gasper requires more time than typical to process information. The treatment team may benefit from waiting for a verbal response to information before presenting additional information.  . Performance will generally be best in a structured, routine, and familiar environment, as opposed to situations involving complex problems.   Recommendations for discharge planning:  . Complete a comprehensive neuropsychological evaluation as an outpatient in 6-12 months to assess for interval change. . Maintain engagement in mentally, physically and cognitively stimulating activities.  . Strive to maintain a healthy lifestyle (e.g., proper diet and exercise) in order to promote physical, cognitive and emotional health.  . Due to the nature and severity of the symptoms noted during this evaluation, it is recommended that Ms. Imel initially obtain constant care and supervision following this hospitalization.  . Establishing a power of attorney is warranted.  . Ms. Bowlds should refrain from driving at this time.      Marlane Hatcher, Psy.D.  Clinical Neuropsychologist

## 2015-01-23 NOTE — Progress Notes (Signed)
Physical Therapy Discharge Summary  Patient Details  Name: Becky Rogers MRN: [id number] Date of Birth: Jan 27, 1951  Today's Date: 01/24/2015 PT Individual Time: 1400-1506 PT Individual Time Calculation (min): 66 min    Patient has met 13 of 13 long term goals due to improved activity tolerance, improved balance, improved postural control, increased strength, improved attention and improved awareness.  Patient to discharge at an ambulatory level Supervision due to cognitive impairments/safety concerns.   Patient's care partner is independent to provide the necessary cognitive assistance at discharge following training completed with Dr. Beverly Gust, neuropsychologist.  Reasons goals not met: All goals met  Recommendation:  No physical therapy services recommended at this time.   Equipment: No equipment provided  Reasons for discharge: treatment goals met and discharge from hospital  Patient/family agrees with progress made and goals achieved: Yes  PT Discharge Pt participated in skilled PT today with focus on continued safety and training for D/C home tomorrow.  Pt performing toileting MOD I upon therapist arrival.  Pt performed gait in controlled and home environment mod I with supervision needed for community mobility in unfamiliar environments or more distracting environments.  Pt performed simulated car transfer with supervision with verbal cues needed to enter car on correct side and one cue for seat belt.  Performed stair negotiation up/down 12 stairs x 2 with one - no rail with alternating sequence independently with no cues needed for safety.  Reviewed and had pt return demonstrate floor > furniture transfer; pt performed with supervision without UE support for balance.  In ADL apartment pt demonstrated independence with bed mobility and furniture transfers.  Performed re-assessment of all balance outcome measures with pt improving to 56/56 on the BERG; 0.09 seconds for 5 times sit <>  stand, normal TUG 0.08 seconds and Cognitive TUG 0.13 seconds indicating low falls risk.  Educated pt on and provided pt with handout of OTAGO HEP.  Demonstrated each exercise to pt and had pt return demonstrate.  Returned to room and pt made MOD I within room/controlled environment.    Pain Pain Assessment Pain Assessment: No/denies pain Cognition Overall Cognitive Status: Impaired/Different from baseline Arousal/Alertness: Awake/alert Orientation Level: Oriented X4 Attention: Sustained;Selective Sustained Attention: Appears intact Selective Attention: Impaired Selective Attention Impairment: Functional basic Memory: Impaired Memory Impairment: Storage deficit;Decreased short term memory;Decreased recall of new information Decreased Short Term Memory: Verbal basic;Functional basic Awareness: Impaired Awareness Impairment: Emergent impairment Problem Solving: Impaired Problem Solving Impairment: Functional basic Safety/Judgment: Impaired Comments: patient with baseline memory deficits, however, suspect they are now exacerbated  Sensation Sensation Light Touch: Appears Intact Stereognosis: Not tested Hot/Cold: Not tested Proprioception: Appears Intact Coordination Gross Motor Movements are Fluid and Coordinated: Yes Motor  Motor Motor: Within Functional Limits  Mobility Bed Mobility Supine to Sit: 7: Independent Sit to Supine: 7: Independent Transfers Transfers: Yes Sit to Stand: 7: Independent Stand to Sit: 7: Independent Stand Pivot Transfers: 6: Modified independent (Device/Increase time) Locomotion  Ambulation Ambulation/Gait Assistance: 7: Independent Ambulation Distance (Feet): 300 Feet Assistive device: None Stairs / Additional Locomotion Stairs: Yes Stairs Assistance: 7: Independent Stair Management Technique: No rails;Alternating pattern;Forwards Number of Stairs: 24 Wheelchair Mobility Wheelchair Mobility: No  Trunk/Postural Assessment  Cervical  Assessment Cervical Assessment: Within Functional Limits Thoracic Assessment Thoracic Assessment: Within Functional Limits Lumbar Assessment Lumbar Assessment: Within Functional Limits Postural Control Postural Control: Within Functional Limits  Balance Standardized Balance Assessment Standardized Balance Assessment: Berg Balance Test;Timed Up and Go Test Berg Balance Test Sit to Stand: Able to stand  without using hands and stabilize independently Standing Unsupported: Able to stand safely 2 minutes Sitting with Back Unsupported but Feet Supported on Floor or Stool: Able to sit safely and securely 2 minutes Stand to Sit: Sits safely with minimal use of hands Transfers: Able to transfer safely, minor use of hands Standing Unsupported with Eyes Closed: Able to stand 10 seconds safely Standing Ubsupported with Feet Together: Able to place feet together independently and stand 1 minute safely From Standing, Reach Forward with Outstretched Arm: Can reach confidently >25 cm (10") From Standing Position, Pick up Object from Floor: Able to pick up shoe safely and easily From Standing Position, Turn to Look Behind Over each Shoulder: Looks behind from both sides and weight shifts well Turn 360 Degrees: Able to turn 360 degrees safely in 4 seconds or less Standing Unsupported, Alternately Place Feet on Step/Stool: Able to stand independently and safely and complete 8 steps in 20 seconds Standing Unsupported, One Foot in Front: Able to place foot tandem independently and hold 30 seconds Standing on One Leg: Able to lift leg independently and hold > 10 seconds Total Score: 56 Timed Up and Go Test Normal TUG (seconds): 8 Cognitive TUG (seconds): 13 Dynamic Sitting Balance Dynamic Sitting - Balance Support: No upper extremity supported Dynamic Sitting - Level of Assistance: 7: Independent Static Standing Balance Static Standing - Level of Assistance: 7: Independent Dynamic Standing  Balance Dynamic Standing - Level of Assistance: 7: Independent  Pt also performed 5 times sit <> stand in .09 seconds improved from 13 seconds.  Extremity Assessment  RUE Assessment RUE Assessment: Within Functional Limits LUE Assessment LUE Assessment: Within Functional Limits RLE Assessment RLE Assessment: Within Functional Limits LLE Assessment LLE Assessment: Within Functional Limits  See FIM for current functional status  Raylene Everts [facility] 01/24/2015, 4:35 PM

## 2015-01-23 NOTE — Progress Notes (Signed)
[facility]     PROGRESS NOTE    Subjective/Complaints: In bed. Had a good night. No new issues. Daughter in room visiting today.   ROS: Pt denies fever, rash/itching, headache, blurred or double vision, nausea, vomiting, abdominal pain, diarrhea, chest pain, shortness of breath, palpitations, dysuria, dizziness, neck or back pain, bleeding, anxiety, or depression   Objective: Vital Signs: Blood pressure 135/56, pulse 85, temperature 98.8 F (37.1 C), temperature source Oral, resp. rate 20, height 5\' 2"  (1.575 m), weight 54.704 kg (120 lb 9.6 oz), SpO2 97 %. No results found.  Recent Labs  01/22/15 0610  WBC 10.0  HGB 9.8*  HCT 29.2*  PLT 331    Recent Labs  01/22/15 0610 01/23/15 0450  NA 141 144  K 2.8* 3.7  CL 108 112*  GLUCOSE 91 83  BUN 22* 18  CREATININE 0.83 0.63  CALCIUM 7.3* 7.6*   CBG (last 3)  No results for input(s): GLUCAP in the last 72 hours.  Wt Readings from Last 3 Encounters:  01/23/15 54.704 kg (120 lb 9.6 oz)  01/16/15 59.2 kg (130 lb 8.2 oz)  08/22/14 51.256 kg (113 lb)    Physical Exam:  Constitutional: She is oriented to person, place, and time with cueing. She appears well-developed and well-nourished.  HENT:  Head: Normocephalic and atraumatic.  Right Ear: External ear normal.  Left Ear: External ear normal.  Mouth/Throat: Oropharynx is clear and moist.  Eyes: Conjunctivae are normal. Pupils are equal, round, and reactive to light.  Neck: Normal range of motion. Neck supple. No JVD present. No tracheal deviation present. No thyromegaly present.  Cardiovascular: Normal rate and regular rhythm. Exam reveals no friction rub.  No murmur heard. Respiratory: Effort normal. No respiratory distress. She has decreased breath sounds in the right lower field and the left lower field. She has no wheezes. She exhibits no tenderness.  GI: Soft. Bowel sounds are normal. She exhibits no distension. There is no  tenderness. There is no rebound.  Genitourinary:  Left nephrostomy draining  Musculoskeletal: She exhibits edema, tr to 1+ in legs. Minimal in arms   Neurological: She is alert and oriented to person, place, and time. No cranial nerve deficit. Coordination normal.  Oriented to situation, place, date with cues. Attention and awareness improving.  Able to follow basic one step commands without difficulty. UE's 3+ prox to 4/5 distally. LE's 2+hf, 3/5 ke and 3+ ankles. Senses pain and LT in both legs. No rigidity or tremor. Masked facies.   No sensory deficits  Skin: Skin is warm and dry.  Toes/finger blistering, evolving. No infection, mild drainage. minimally tender Psychiatric: She has a normal mood and affect. No agitation or anxiety   Assessment/Plan: 1. Functional deficits secondary to debility/encephalopathy after septic shock/prolonged hospital course which require 3+ hours per day of interdisciplinary therapy in a comprehensive inpatient rehab setting. Physiatrist is providing close team supervision and 24 hour management of active medical problems listed below. Physiatrist and rehab team continue to assess barriers to discharge/monitor patient progress toward functional and medical goals.  FIM: FIM - Bathing Bathing Steps Patient Completed: Chest, Right Arm, Left Arm, Abdomen, Front perineal area, Buttocks, Right upper leg, Left upper leg, Right lower leg (including foot), Left lower leg (including foot) Bathing: 5: Supervision: Safety issues/verbal cues  FIM - Upper Body Dressing/Undressing Upper body dressing/undressing steps patient completed: Thread/unthread right bra strap, Thread/unthread left bra strap, Thread/unthread right sleeve of pullover shirt/dresss, Thread/unthread left sleeve of  pullover shirt/dress, Put head through opening of pull over shirt/dress, Pull shirt over trunk, Hook/unhook bra Upper body dressing/undressing: 5: Set-up assist to: Obtain clothing/put away FIM  - Lower Body Dressing/Undressing Lower body dressing/undressing steps patient completed: Thread/unthread right underwear leg, Thread/unthread left underwear leg, Pull underwear up/down, Thread/unthread left pants leg, Pull pants up/down, Thread/unthread right pants leg, Don/Doff left shoe, Don/Doff right shoe Lower body dressing/undressing: 4: Min-Patient completed 75 plus % of tasks (dressing on feet )  FIM - Toileting Toileting steps completed by patient: Adjust clothing prior to toileting, Performs perineal hygiene, Adjust clothing after toileting Toileting Assistive Devices: Grab bar or rail for support Toileting: 4: Steadying assist  FIM - Radio producer Devices: Grab bars Toilet Transfers: 4-To toilet/BSC: Min A (steadying Pt. > 75%), 4-From toilet/BSC: Min A (steadying Pt. > 75%)  FIM - Bed/Chair Transfer Bed/Chair Transfer: 5: Bed > Chair or W/C: Supervision (verbal cues/safety issues), 5: Chair or W/C > Bed: Supervision (verbal cues/safety issues)  FIM - Locomotion: Wheelchair Locomotion: Wheelchair: 0: Activity did not occur FIM - Locomotion: Ambulation Locomotion: Ambulation Assistive Devices: Other (comment) (no AD) Ambulation/Gait Assistance: 5: Supervision Locomotion: Ambulation: 5: Travels 150 ft or more with supervision/safety issues  Comprehension Comprehension Mode: Auditory Comprehension: 4-Understands basic 75 - 89% of the time/requires cueing 10 - 24% of the time  Expression Expression Mode: Verbal Expression: 5-Expresses complex 90% of the time/cues < 10% of the time  Social Interaction Social Interaction: 5-Interacts appropriately 90% of the time - Needs monitoring or encouragement for participation or interaction.  Problem Solving Problem Solving: 4-Solves basic 75 - 89% of the time/requires cueing 10 - 24% of the time  Memory Memory: 3-Recognizes or recalls 50 - 74% of the time/requires cueing 25 - 49% of the time  Medical  Problem List and Plan: 1. Functional deficits secondary to Encephalopathy and debility after sepsis  -will need supervision at home after discharge, but i don't suspect it will be long term.  -spoke with daughter this morning about expectations 2. DVT Prophylaxis/Anticoagulation: Mechanical: Sequential compression devices, below knee Bilateral lower extremities  - elevate legs 3. Pain Management: Tylenol prn for pain 4. Mood: LCSW to follow with patient and husband for support. 5. Neuropsych: This patient is not capable of making decisions on her own behalf.  -gradual albeit not consistent improvement  -no infectious or metabolic cause for change. dont think CT is needed at this point (one was just done a few days ago)-  -follow for clinical changes 6. Skin/Wound Care: fingers and toes continue to evolve---remain clean  -padding, local care between toes and fingers, keep clean  -appreciate WOC RN eval---consvt mgt, keep areas dry 7. Fluids/Electrolytes/Nutrition: encourage po. Back off potassium supp---labs reviewed and normal today 8. Left ureteral stone: Left nephrostomy tube in place with good UOP. 9. AKI: Resolving post L-nephrostomy tube. BUN/CR have normalized 10 Urosepsis due to E coli bacteremia/UTI: Continue antibiotics D # 13/ 14 days.  11. Hypokalemia: needs further replacement today 12. Leucocytosis: .  -steroid effect  -wbc's now within normal limits  -hgb 9.8 13. Parkinson's disease: On Sinemet 50/200 tid and artane bid  14. Acute respiratory failure: Resolved. Lasix still on hold due to AKI. Encourage IS.  15. Insomnia: klonopin.  16. Thrombocytopenia: platelets recovering---331k  LOS (Days) 7 A FACE TO FACE EVALUATION WAS PERFORMED  Reade Trefz T 01/23/2015 8:30 AM

## 2015-01-23 NOTE — Progress Notes (Signed)
Occupational Therapy Session Note  Patient Details  Name: Becky Rogers MRN: [id number] Date of Birth: [birth year]  Today's Date: 01/23/2015 OT Individual Time: 0900-1000 OT Individual Time Calculation (min): 60 min    Short Term Goals: Week 1:  OT Short Term Goal 1 (Week 1): STGs equal to LTGs based on ELOS.  Skilled Therapeutic Interventions/Progress Updates:    Pt engaged in BADL retraining including bathing at shower level and dressing .  Pt amb without AD in room to gather clothing and supplies prior to walking into bathroom for shower.  Pt required assistance with management of tubing and covering port and IV access.  Pt completed bathing tasks while standing approx 50% of time.  Pt completed dressing tasks while standing and using UE support on wall when threading underpants and pants.  Pt completed grooming tasks while standing at sink.  Pt transitioned to home mgmt tasks with focus on locating items within room.  Pt required max verbal cues to recall 4 items but was able to locate items without assistance.  Pt appears to be visually distracted when performing tasks.  Focus on BADL retraining, task initiation, sequencing, cognitive deficits, standing balance, and safety awareness.  Therapy Documentation Precautions:  Precautions Precautions: Fall Restrictions Weight Bearing Restrictions: No Pain: Pt denied pain.  See FIM for current functional status  Therapy/Group: Individual Therapy  Leroy Libman 01/23/2015, 1:38 PM

## 2015-01-23 NOTE — Progress Notes (Signed)
Physical Therapy Session Note  Patient Details  Name: NATOYA VISCOMI MRN: [id number] Date of Birth: 1951/06/16  Today's Date: 01/23/2015 PT Individual Time: 1300-1400 PT Individual Time Calculation (min): 60 min   Short Term Goals: Week 1:  PT Short Term Goal 1 (Week 1): = LTGs due to anticipated LOS  Skilled Therapeutic Interventions/Progress Updates:  Session focused on activity tolerance and cognition. Discussed with PA recommendation to wear pt regular socks with shoes due to bilat LE swelling and dressing changes. Pt ambulated throughout rehab unit and stood for entire session with only one seated rest break. Pt required max cuing for setup of familiar game. Activity modified to a grid system involving rows and columns to challenge working memory, problem solving, and sequencing. Pt able to perform first 3 trials successfully with no assistance however trial 4 unsuccessful despite max multimodal cuing. Activity graded by labeling rows and columns. Pt completed remaining trials in more than reasonable amount of time and inconsistent min-max cuing but was unable to utilize provided labels. Pt performed functional task with focus on working memory, problem solving, and path finding to locate 2 designated rooms on the rehab floor. Pt completed task with more than reasonable amount of time and required mod-max multimodal cues to identify helpful signs, interpret signs, and recall of task. Pt returned to room and handed off to OT.  Therapy Documentation Precautions:  Precautions Precautions: Fall Restrictions Weight Bearing Restrictions: No  Pain Assessment Pain Assessment: No/denies pain  See FIM for current functional status  Therapy/Group: Individual Therapy  Elsie Ra 01/23/2015, 4:37 PM

## 2015-01-23 NOTE — Progress Notes (Signed)
Pt refused to wear SCDs last night stated it hurts her legs. Wore prevelon boots half the night found prevelon boots on floor this morning. Encouraged her to wear but pt declined. Lynett Fish, RN

## 2015-01-24 ENCOUNTER — Inpatient Hospital Stay (HOSPITAL_COMMUNITY): Payer: BLUE CROSS/BLUE SHIELD | Admitting: Speech Pathology

## 2015-01-24 ENCOUNTER — Inpatient Hospital Stay (HOSPITAL_COMMUNITY): Payer: BLUE CROSS/BLUE SHIELD

## 2015-01-24 ENCOUNTER — Inpatient Hospital Stay (HOSPITAL_COMMUNITY): Payer: BLUE CROSS/BLUE SHIELD | Admitting: Physical Therapy

## 2015-01-24 ENCOUNTER — Inpatient Hospital Stay (HOSPITAL_COMMUNITY): Payer: BLUE CROSS/BLUE SHIELD | Admitting: Occupational Therapy

## 2015-01-24 DIAGNOSIS — E876 Hypokalemia: Secondary | ICD-10-CM | POA: Diagnosis present

## 2015-01-24 DIAGNOSIS — R7881 Bacteremia: Secondary | ICD-10-CM

## 2015-01-24 DIAGNOSIS — D72829 Elevated white blood cell count, unspecified: Secondary | ICD-10-CM

## 2015-01-24 DIAGNOSIS — B9620 Unspecified Escherichia coli [E. coli] as the cause of diseases classified elsewhere: Secondary | ICD-10-CM | POA: Diagnosis present

## 2015-01-24 DIAGNOSIS — R609 Edema, unspecified: Secondary | ICD-10-CM

## 2015-01-24 MED ORDER — POTASSIUM CHLORIDE CRYS ER 20 MEQ PO TBCR
20.0000 meq | EXTENDED_RELEASE_TABLET | Freq: Two times a day (BID) | ORAL | Status: DC
  Administered 2015-01-24 – 2015-01-25 (×2): 20 meq via ORAL
  Filled 2015-01-24 (×6): qty 1

## 2015-01-24 MED ORDER — FUROSEMIDE 20 MG PO TABS
20.0000 mg | ORAL_TABLET | Freq: Every day | ORAL | Status: DC
  Administered 2015-01-24 – 2015-01-25 (×2): 20 mg via ORAL
  Filled 2015-01-24 (×4): qty 1

## 2015-01-24 MED ORDER — FUROSEMIDE 20 MG PO TABS: 20.0000 mg | ORAL_TABLET | Freq: Every day | ORAL | Status: DC

## 2015-01-24 MED ORDER — POTASSIUM CHLORIDE CRYS ER 20 MEQ PO TBCR
20.0000 meq | EXTENDED_RELEASE_TABLET | Freq: Two times a day (BID) | ORAL | Status: DC
Start: 2015-01-24 — End: 2015-01-24

## 2015-01-24 MED ORDER — POTASSIUM CHLORIDE CRYS ER 20 MEQ PO TBCR: 20.0000 meq | EXTENDED_RELEASE_TABLET | Freq: Two times a day (BID) | ORAL | Status: DC

## 2015-01-24 NOTE — Progress Notes (Signed)
Occupational Therapy Discharge Summary  Patient Details  Name: Becky Rogers MRN: [id number] Date of Birth: [birth year]   Patient has met 6 of 11 long term goals due to improved activity tolerance, improved balance, postural control, ability to compensate for deficits, improved attention, improved awareness and improved coordination.  Patient to discharge at overall Supervision level.  Patient's care partner is independent to provide the necessary physical and cognitive assistance at discharge.    Reasons goals not met: N/A. All LTGs met.   Recommendation:  No skilled occupational therapy recommended at this time.   Equipment: No equipment provided  Reasons for discharge: treatment goals met and discharge from hospital  Patient/family agrees with progress made and goals achieved: Yes  OT Discharge   Vision/Perception  Vision- History Baseline Vision/History: Wears glasses Wears Glasses: Reading only;Distance only Patient Visual Report: No change from baseline Vision- Assessment Vision Assessment?: Yes;No apparent visual deficits  Cognition Overall Cognitive Status: Impaired/Different from baseline Arousal/Alertness: Awake/alert Orientation Level: Oriented X4 Attention: Sustained;Selective Sustained Attention: Appears intact Selective Attention: Impaired Selective Attention Impairment: Functional basic Memory: Impaired Memory Impairment: Storage deficit;Decreased short term memory;Decreased recall of new information Decreased Short Term Memory: Verbal basic;Functional basic Awareness: Impaired Awareness Impairment: Emergent impairment Problem Solving: Impaired Problem Solving Impairment: Functional basic Safety/Judgment: Impaired Comments: patient with baseline memory deficits, however, suspect they are now exacerbated  Sensation Sensation Light Touch: Appears Intact Stereognosis: Appears Intact Hot/Cold: Appears Intact Proprioception: Appears Intact Motor   Motor Motor: Within Functional Limits Trunk/Postural Assessment  Cervical Assessment Cervical Assessment: Within Functional Limits Thoracic Assessment Thoracic Assessment: Within Functional Limits Lumbar Assessment Lumbar Assessment: Within Functional Limits Postural Control Postural Control: Within Functional Limits  Balance Dynamic Sitting Balance Dynamic Sitting - Balance Support: No upper extremity supported Dynamic Sitting - Level of Assistance: 7: Independent Extremity/Trunk Assessment RUE Assessment RUE Assessment: Within Functional Limits LUE Assessment LUE Assessment: Within Functional Limits  See FIM for current functional status  Leroy Libman 01/24/2015, 1:44 PM

## 2015-01-24 NOTE — Progress Notes (Signed)
Pt removed prevelon boots this AM.

## 2015-01-24 NOTE — Progress Notes (Signed)
Occupational Therapy Session Note  Patient Details  Name: ZONA PEDRO MRN: [id number] Date of Birth: Jul 16, 1950  Today's Date: 01/24/2015 OT Individual Time: 1000-1030 OT Individual Time Calculation (min): 30 min    Skilled Therapeutic Interventions/Progress Updates:    1:1 Cognitive rehabilitation with focus on work memory, sequencing, task organization, selective attention in moderate distracting environment, problem solving, perceptual and spatial relations etc. Pt participated in pipe tree activity of minimal difficulty. Pt required total A to complete with multiple multimodal cuing to complete selected image with each step.Marland Kitchen Pt with intellectual awareness of the difficulty with the task however requires cuing to request assistance. Pt required max A to locate room with signage and questioning cuing. Pt does demonstrate functional ambulation with supervision to mod I.  Therapy Documentation Precautions:  Precautions Precautions: Fall Restrictions Weight Bearing Restrictions: No Pain: Pain Assessment Pain Assessment: No/denies pain  See FIM for current functional status  Therapy/Group: Individual Therapy  Willeen Cass [facility] 01/24/2015, 11:21 AM

## 2015-01-24 NOTE — Discharge Instructions (Signed)
Inpatient Rehab Discharge Instructions  KIMELA MALSTROM Discharge date and time:  01/25/15  Activities/Precautions/ Functional Status: Activity: activity as tolerated with supervision Diet: regular diet Low salt Wound Care:  Wash area around drain with soap and water. Pat dry. Empty daily and record drainage.                         --To ruptured blisters on finger and toes: Cleanse with soap and water. Pat dry and apply bacitracin ointment and cover with dry dressing. Keep areas between toes/figers clean and dry.   Functional status:  ___ No restrictions     ___ Walk up steps independently _X__ 24/7 supervision/assistance   ___ Walk up steps with assistance ___ Intermittent supervision/assistance  ___ Bathe/dress independently ___ Walk with walker     ___ Bathe/dress with assistance ___ Walk Independently    ___ Shower independently ___ Walk with assistance    ___ Shower with assistance _X__ No alcohol     ___ Return to work/school ________     COMMUNITY REFERRALS UPON DISCHARGE:    Home Health:        [facility]    RN                          Agency:  [place]  Phone: [phone]     GENERAL COMMUNITY RESOURCES FOR PATIENT/FAMILY:  Support Groups: Parkinson's Support group    Caregiver Support: support group handout      Special Instructions: 1. Follow up with your doctor for monitoring of healing of fingers and toes. Contact MD if you develop drainage, increase in pain, redness, fevers or chills.  2.  Keep legs elevated when seated and when in bed. Wrap legs with ace wrap (from mid foot to mid thigh) during the day to help with swelling.  3. Drink plenty of water daily especially for the next week while on lasix.    My questions have been answered and I understand these instructions. I will adhere to these goals and the provided educational materials after my discharge from the hospital.  Patient/Caregiver Signature _______________________________ Date  __________  Clinician Signature _______________________________________ Date __________  Please bring this form and your medication list with you to all your follow-up doctor's appointments.

## 2015-01-24 NOTE — Discharge Summary (Signed)
Physician Discharge Summary  Patient ID: Becky Rogers MRN: [id number] DOB/AGE: Dec 08, 1950 64 y.o.  Admit date: 01/16/2015 Discharge date: 01/24/2015  Discharge Diagnoses:  Principal Problem:   Debility Active Problems:   Pyelonephritis   Acute renal failure syndrome   Metabolic encephalopathy   Parkinson's disease   Hypokalemia   Edema--BLE   Leucocytosis   Bacteremia due to Escherichia coli   Discharged Condition: Stable.   Significant Diagnostic Studies:  Ct Head Wo Contrast  01/12/2015   CLINICAL DATA:  Altered mental status, sepsis.  EXAM: CT HEAD WITHOUT CONTRAST  TECHNIQUE: Contiguous axial images were obtained from the base of the skull through the vertex without intravenous contrast.  COMPARISON:  None.  FINDINGS: No acute intracranial abnormality. Specifically, no hemorrhage, hydrocephalus, mass lesion, acute infarction, or significant intracranial injury. No acute calvarial abnormality. Visualized paranasal sinuses and mastoids clear. Orbital soft tissues unremarkable.  IMPRESSION: No acute intracranial abnormality.   Electronically Signed   By: Rolm Baptise M.D.   On: 01/12/2015 13:09    US Abdomen Port  01/11/2015   CLINICAL DATA:  Evaluate gallbladder.  EXAM: ULTRASOUND PORTABLE ABDOMEN  COMPARISON:  CT of 01/09/2015  FINDINGS: Gallbladder: Gallbladder distension at 8.4 cm. Sludge within. Wall thickness upper normal. No pericholecystic fluid. Sonographic Murphy's sign was not elicited.  Common bile duct: Diameter: Upper normal for age, 7 mm. No obstruction on prior CT.  Liver: Mild intrahepatic ductal dilatation within the left lobe. No focal liver lesion.  IVC: No abnormality visualized.  Pancreas: Visualized portion unremarkable.  Spleen: Size and appearance within normal limits.  Right Kidney: Length: 11.3 cm. No hydronephrosis. Mild increased echogenicity.  Left Kidney: Length: 11.7 cm. Poorly evaluated. No gross hydronephrosis.  Abdominal aorta: No aneurysm visualized.   Other findings: Bilateral pleural effusions.  IMPRESSION: 1. Gallbladder sludge and gallbladder distention, without specific evidence of acute cholecystitis. 2. Upper normal common duct size with mild intrahepatic duct dilatation in the left hepatic lobe. No obstructive mass or stone seen on recent CT. If bilirubin is elevated, MRCP may be informative to exclude otherwise occult obstruction. 3. Small bilateral pleural effusions. 4. Suboptimal evaluation of the left kidney. 5. Increased echogenicity within the right kidney may represent medical renal disease.   Electronically Signed   By: Abigail Miyamoto M.D.   On: 01/11/2015 19:34   Dg Chest Port 1 View  01/15/2015   CLINICAL DATA:  Shortness of Breath  EXAM: PORTABLE CHEST - 1 VIEW  COMPARISON:  January 13, 2015  FINDINGS: There has been considerable clearing of interstitial edema compared to 2 days prior. There remains left lower lobe consolidation with left effusion. There is a smaller right effusion. There is atelectatic change in the right mid lung. Heart is upper normal in size with pulmonary vascularity within normal limits. No adenopathy.  IMPRESSION: Considerable clearing of interstitial edema compared to recent prior study. Persistent left lower lobe consolidation with small left effusion. A smaller right effusion is also present. Right midlung atelectatic change present.   Electronically Signed   By: Lowella Grip III M.D.   On: 01/15/2015 65:68    Basic Metabolic Panel:  Recent Labs Lab 01/22/15 0610 01/23/15 0450  NA 141 144  K 2.8* 3.7  CL 108 112*  CO2 26 26  GLUCOSE 91 83  BUN 22* 18  CREATININE 0.83 0.63  CALCIUM 7.3* 7.6*    CBC:  Recent Labs Lab 01/18/15 0502 01/19/15 0512 01/22/15 0610  WBC 30.1* 20.8* 10.0  HGB 10.7*  10.4* 9.8*  HCT 31.8* 30.8* 29.2*  MCV 86.9 88.0 87.4  PLT 148* 184 331    CBG: No results for input(s): GLUCAP in the last 168 hours.   Brief HPI:   Becky Rogers is a 64 y.o. female with  history of Parkinson's disease with mild dementia, breast cancer, HTN who was admitted 01/09/15 with abdominal pain, acute renal failure and hypotension due to urosepsis. She was started on pressors, solucortef and fluids as well as broad spectrum antibiotics for septic shock. CT revealed left ureteral stone with concerns for obstructed pyonephrosis and underwent left percutaneous nephrostomy by interventional radiology. Dr. Alinda Money recommended 14 days of culture specific antibiotics prior to any definitive stone procedure and follow up with Dr. Tresa Moore on outpatient basis. Antibiotics changed to ceftriaxone for pansensitive E coli bacteremia. Mentation is improving. Acute renal failure slowly improving and thrombocytopenia/coagulopathy resolving. Ischemic injury to bilateral finger and toes/feet being monitored for demarcation/gangrenous changes. Swallow evaluation revealed mild cognitively based dysphagia and was placed on esophageal/aspiration precautions. Therapy ongoing and CIR recommended by MD and Rehab team.    Hospital Course: Becky Rogers was admitted to rehab 01/16/2015 for inpatient therapies to consist of PT, ST and OT at least three hours five days a week. Past admission physiatrist, therapy team and rehab RN have worked together to provide customized collaborative inpatient rehab.    Rehab course: During patient's stay in rehab weekly team conferences were held to monitor patient's progress, set goals and discuss barriers to discharge.  Patient has had improvement in activity tolerance, balance, postural control, as well as ability to compensate for deficits.       Disposition: Home   Diet: Regular. Low salt.   Special Instructions: 1. Keep legs elevated when seated. Ace wrap BLE daily in am. 2. Wash areas on bilateral hands and feet with soap and water. Pat dry. Apply bacitracin ointment and dry dressing.      Medication List    STOP taking these medications         amLODipine 5 MG tablet  Commonly known as:  NORVASC     aspirin 81 MG tablet     cefTRIAXone 40 MG/ML IVPB  Commonly known as:  ROCEPHIN     fish oil-omega-3 fatty acids 1000 MG capsule     HYDROcodone-acetaminophen 5-325 MG per tablet  Commonly known as:  NORCO     hydrocortisone sodium succinate 100 MG Solr injection  Commonly known as:  SOLU-CORTEF      TAKE these medications        atorvastatin 40 MG tablet  Commonly known as:  LIPITOR  Take 40 mg by mouth daily.     CALCIUM-VITAMIN D PO  Take 2 tablets by mouth daily.     carbidopa-levodopa 25-100 MG per tablet  Commonly known as:  SINEMET IR  Take 2 tablets by mouth 3 (three) times daily.     clonazePAM 0.5 MG tablet  Commonly known as:  KLONOPIN  Take 0.5 mg by mouth at bedtime.     furosemide 20 MG tablet  Commonly known as:  LASIX  Take 1 tablet (20 mg total) by mouth daily. For 5-7 days till leg swelling goes down     multivitamins ther. w/minerals Tabs tablet  Take 1 tablet by mouth daily.     potassium chloride SA 20 MEQ tablet  Commonly known as:  K-DUR,KLOR-CON  Take 1 tablet (20 mEq total) by mouth 2 (two) times daily. While taking diuretic(Lasix)  TRIHEXYPHENIDYL HCL PO  Take 1 tablet by mouth 2 (two) times daily.       Follow-up Information    Follow up with [name], MD On 03/24/2015.   Specialty:  Physical Medicine and Rehabilitation   Why:  Be there at 10:30  for 11 am  appointment    Contact information:   [address], [place] [address] [phone]       Follow up with [name], MD. Call today.   Specialty:  Urology   Why:  for follow up appointment   Contact information:   [place] [address] [phone]       Call Mosetta Anis, MD.   Specialty:  Neurology   Why:  for follow up appointment   Contact information:   [place] [address] [phone]       Follow up with Bing Matter,  PA-C On 02/04/2015.   Specialty:  Family Medicine   Why:  BE there at 11:30 for 11:50 am appointment/Needs to have Labs checked (BMET)   Contact information:   [address] [place] [address] [phone]       Signed: Bary Leriche 01/24/2015, 8:35 AM

## 2015-01-24 NOTE — Progress Notes (Signed)
[facility] PHYSICAL [facility]ICINE & REHABILITATION     PROGRESS NOTE    Subjective/Complaints: Up with OT walking in room. Finger/toes continue to evolve---denies pain in them.   ROS: Pt denies fever, rash/itching, headache, blurred or double vision, nausea, vomiting, abdominal pain, diarrhea, chest pain, shortness of breath, palpitations, dysuria, dizziness, neck or back pain, bleeding, anxiety, or depression   Objective: Vital Signs: Blood pressure 125/48, pulse 87, temperature 98.7 F (37.1 C), temperature source Oral, resp. rate 16, height 5\' 2"  (1.575 m), weight 49.6 kg (109 lb 5.6 oz), SpO2 92 %. No results found.  Recent Labs  01/22/15 0610  WBC 10.0  HGB 9.8*  HCT 29.2*  PLT 331    Recent Labs  01/22/15 0610 01/23/15 0450  NA 141 144  K 2.8* 3.7  CL 108 112*  GLUCOSE 91 83  BUN 22* 18  CREATININE 0.83 0.63  CALCIUM 7.3* 7.6*   CBG (last 3)  No results for input(s): GLUCAP in the last 72 hours.  Wt Readings from Last 3 Encounters:  01/24/15 49.6 kg (109 lb 5.6 oz)  01/16/15 59.2 kg (130 lb 8.2 oz)  08/22/14 51.256 kg (113 lb)    Physical Exam:  Constitutional: She is oriented to person, place, and time with cueing. She appears well-developed and well-nourished.  HENT:  Head: Normocephalic and atraumatic.  Right Ear: External ear normal.  Left Ear: External ear normal.  Mouth/Throat: Oropharynx is clear and moist.  Eyes: Conjunctivae are normal. Pupils are equal, round, and reactive to light.  Neck: Normal range of motion. Neck supple. No JVD present. No tracheal deviation present. No thyromegaly present.  Cardiovascular: Normal rate and regular rhythm. Exam reveals no friction rub.  No murmur heard. Respiratory: Effort normal. No respiratory distress. She has decreased breath sounds in the right lower field and the left lower field. She has no wheezes. She exhibits no tenderness.  GI: Soft. Bowel sounds are normal. She exhibits no distension. There  is no tenderness. There is no rebound.  Genitourinary:  Left nephrostomy draining  Musculoskeletal: She exhibits edema 1+ in legs.     Neurological: She is alert and oriented to person, place, and time. No cranial nerve deficit. Coordination normal.  Oriented to situation, place, date with cues. Attention and awareness improving.  Able to follow basic one step commands without difficulty. UE's 3+ prox to 4/5 distally. LE's 2+hf, 3/5 ke and 3+ ankles. Senses pain and LT in both legs. No rigidity or tremor. Masked facies.   No sensory deficits  Skin: Skin is warm and dry.  Toes/finger blistering, evolving. No infection, mild drainage. minimally tender Psychiatric: She has a normal mood and affect. No agitation or anxiety   Assessment/Plan: 1. Functional deficits secondary to debility/encephalopathy after septic shock/prolonged hospital course which require 3+ hours per day of interdisciplinary therapy in a comprehensive inpatient rehab setting. Physiatrist is providing close team supervision and 24 hour management of active medical problems listed below. Physiatrist and rehab team continue to assess barriers to discharge/monitor patient progress toward functional and medical goals.  FIM: FIM - Bathing Bathing Steps Patient Completed: Chest, Right Arm, Left Arm, Abdomen, Front perineal area, Buttocks, Right upper leg, Left upper leg, Right lower leg (including foot), Left lower leg (including foot) Bathing: 5: Supervision: Safety issues/verbal cues  FIM - Upper Body Dressing/Undressing Upper body dressing/undressing steps patient completed: Thread/unthread right bra strap, Thread/unthread left bra strap, Thread/unthread right sleeve of pullover shirt/dresss, Thread/unthread left sleeve of pullover shirt/dress, Put head  through opening of pull over shirt/dress, Pull shirt over trunk, Hook/unhook bra Upper body dressing/undressing: 5: Set-up assist to: Obtain clothing/put away FIM - Lower Body  Dressing/Undressing Lower body dressing/undressing steps patient completed: Thread/unthread right underwear leg, Thread/unthread left underwear leg, Pull underwear up/down, Thread/unthread left pants leg, Pull pants up/down, Thread/unthread right pants leg, Don/Doff left shoe, Don/Doff right shoe, Don/Doff right sock, Don/Doff left sock Lower body dressing/undressing: 5: Supervision: Safety issues/verbal cues  FIM - Toileting Toileting steps completed by patient: Adjust clothing prior to toileting, Performs perineal hygiene, Adjust clothing after toileting Toileting Assistive Devices: Grab bar or rail for support Toileting: 5: Supervision: Safety issues/verbal cues  FIM - Radio producer Devices: Product manager Transfers: 5-To toilet/BSC: Supervision (verbal cues/safety issues), 5-From toilet/BSC: Supervision (verbal cues/safety issues)  FIM - IT sales professional Transfer: 7: Bed > Chair or W/C: No assist, 7: Chair or W/C > Bed: No assist  FIM - Locomotion: Wheelchair Locomotion: Wheelchair: 0: Activity did not occur FIM - Locomotion: Ambulation Locomotion: Ambulation Assistive Devices: Other (comment) (no AD) Ambulation/Gait Assistance: 7: Independent Locomotion: Ambulation: 7: Travels 150 ft or more independently  Comprehension Comprehension Mode: Auditory Comprehension: 4-Understands basic 75 - 89% of the time/requires cueing 10 - 24% of the time  Expression Expression Mode: Verbal Expression: 5-Expresses basic 90% of the time/requires cueing < 10% of the time.  Social Interaction Social Interaction: 4-Interacts appropriately 75 - 89% of the time - Needs redirection for appropriate language or to initiate interaction.  Problem Solving Problem Solving: 3-Solves basic 50 - 74% of the time/requires cueing 25 - 49% of the time  Memory Memory: 3-Recognizes or recalls 50 - 74% of the time/requires cueing 25 - 49% of the time  Medical Problem  List and Plan: 1. Functional deficits secondary to Encephalopathy and debility after sepsis  -showing gradual cognitive improvement but will need supervision at home after discharge, but i don't suspect it will be long term.   2. DVT Prophylaxis/Anticoagulation: Mechanical: Sequential compression devices, below knee Bilateral lower extremities  - elevate legs  -prn lasix 3. Pain Management: Tylenol prn for pain 4. Mood: LCSW to follow with patient and husband for support. 5. Neuropsych: This patient is not capable of making decisions on her own behalf. 6. Skin/Wound Care: fingers and toes continue to evolve---remain clean  -padding, local care between toes and fingers, keep clean  -HHRN to follow--I'm hopeful that they won't need surgical intervention.   -i can help follow in office  -ACE, prn lasix for swelling 7. Fluids/Electrolytes/Nutrition: encourage po. Back off potassium supp---labs reviewed and normal today 8. Left ureteral stone: Left nephrostomy tube in place with good UOP. 9. AKI: Resolving post L-nephrostomy tube. BUN/CR have normalized 10 Urosepsis due to E coli bacteremia/UTI: abx complete.  11. Hypokalemia: needs further replacement today 12. Leucocytosis: .  -steroid effect  -wbc's now within normal limits  -hgb 9.8 13. Parkinson's disease: On Sinemet 50/200 tid and artane bid  14. Acute respiratory failure: Resolved.   Encourage IS.  -prn lasix  15. Insomnia: klonopin.  16. Thrombocytopenia: platelets recovering---331k  LOS (Days) 8 A FACE TO FACE EVALUATION WAS PERFORMED  SWARTZ,ZACHARY T 01/24/2015 8:42 AM

## 2015-01-24 NOTE — Progress Notes (Signed)
Speech Language Pathology Session Note & Discharge Summary  Patient Details  Name: Becky Rogers MRN: [id number] Date of Birth: November 05, 1950  Today's Date: 01/24/2015 SLP Individual Time: 0800-0900 SLP Individual Time Calculation (min): 60 min   Skilled Therapeutic Interventions:  Skilled treatment session focused on patient education. Patient was educated in regards to the her current cognitive function and strategies to utilize at home to maximize both physical and cognitive activity in a safe enviornemnt, recall of functional information, attention and organization with functional and familiar tasks. She was also educated on need for 24 hour supervision and assistance with medication and money management. She verbalized understanding of all information and asked appropriate questions. Handouts were also given to reinforce information. Patient navigated back to her room with use of signs with extra time and Mod I. Patient left in recliner with quick release belt in place and all needs within reach.    Patient has met 5 of 5 long term goals.  Patient to discharge at overall Mod;Min level.   Reasons goals not met: N/A   Clinical Impression/Discharge Summary: Patient has made functional gains and has met 5 of 5 LTG's this admission due to increased orientation, problem solving with functional and familiar tasks, recall with use of strategies and emergent awareness. Currently, patient's cognitive function is inconsistent at times but patient requires overall Min-Mod A to complete functional and familiar tasks safely in regards to selective attention, functional problem solving, organization, recall and awareness. Patient education complete and handouts were given to reinforce information. Patient will discharge home with 24 hour supervision from family. Patient would benefit from f/u SLP services to maximize cognitive function and overall functional independence to reduce caregiver burden.   Care  Partner:  Caregiver Able to Provide Assistance: Yes  Type of Caregiver Assistance: Physical;Cognitive  Recommendation:  Home Health SLP;24 hour supervision/assistance  Rationale for SLP Follow Up: Maximize cognitive function and independence;Reduce caregiver burden   Equipment: N/A   Reasons for discharge: Treatment goals met;Discharged from hospital   Patient/Family Agrees with Progress Made and Goals Achieved: Yes   See FIM for current functional status  Kam Rahimi 01/24/2015, 11:56 AM

## 2015-01-24 NOTE — Progress Notes (Signed)
Occupational Therapy Session Note  Patient Details  Name: Becky Rogers MRN: [id number] Date of Birth: [birth year]  Today's Date: 01/24/2015 OT Individual Time: 0700-0800 OT Individual Time Calculation (min): 60 min    Short Term Goals: Week 1:  OT Short Term Goal 1 (Week 1): STGs equal to LTGs based on ELOS.  Skilled Therapeutic Interventions/Progress Updates:    Pt seen for skilled OT session with focus on activity tolerance, dynamic standing balance, functional amb without AD, BADLs, sequencing, task initiation, and safety awareness.  Pt asleep in bed upon arrival but easily aroused.  Pt engaged in BADL retraining including bathing, dressing, and toileting.  Pt amb in room to gather clothing and supplies prior to performing tasks.  Pt required min verbal cues to gather all necessary supplies.  Pt remained standing to complete all tasks except donning of socks and shoes.  Pt used wall and sink as support when standing to bathe feet and don pants.  Pt requires more than a reasonable amount of time to initiate and complete tasks.  Pt stated she was happy to be going home tomorrow and felt ready with the assistance of her daughter.  Therapy Documentation Precautions:  Precautions Precautions: Fall Restrictions Weight Bearing Restrictions: No Pain: Pain Assessment Pain Assessment: No/denies pain  See FIM for current functional status  Therapy/Group: Individual Therapy  Leroy Libman 01/24/2015, 8:03 AM

## 2015-01-25 NOTE — Progress Notes (Signed)
Becky Rogers is a 64 y.o. female December 28, 1950 [id number]  Subjective: No new complaints. No new problems. Slept well. Feeling OK. Glad to go home as soon as ride arrives - denies concerns.  Objective: Vital signs in last 24 hours: Temp:  [98.6 F (37 C)-99.1 F (37.3 C)] 99.1 F (37.3 C) (07/16 0600) Pulse Rate:  [83-84] 84 (07/16 0600) Resp:  [17-20] 20 (07/16 0600) BP: (127-139)/(54-68) 127/54 mmHg (07/16 0600) SpO2:  [98 %-99 %] 98 % (07/16 0600) Weight:  [53.3 kg (117 lb 8.1 oz)] 53.3 kg (117 lb 8.1 oz) (07/16 0600) Weight change: 3.7 kg (8 lb 2.5 oz) Last BM Date: 01/23/15  Intake/Output from previous day: 07/15 0701 - 07/16 0700 In: 720 [P.O.:720] Out: 1450 [Urine:1450]  Physical Exam General: No apparent distress  In [facility], anxious for family to take her home  Lungs: Normal effort. Lungs clear to auscultation, no crackles or wheezes. Cardiovascular: Regular rate and rhythm, no BLE edema Musculoskeletal:  Neurovascularly intact Neurological: No new neurological deficits Wounds: extremities tips of digits - Clean, dry, intact. No signs of infection.  Lab Results: BMET    Component Value Date/Time   NA 144 01/23/2015 0450   K 3.7 01/23/2015 0450   CL 112* 01/23/2015 0450   CO2 26 01/23/2015 0450   GLUCOSE 83 01/23/2015 0450   BUN 18 01/23/2015 0450   CREATININE 0.63 01/23/2015 0450   CALCIUM 7.6* 01/23/2015 0450   GFRNONAA >60 01/23/2015 0450   GFRAA >60 01/23/2015 0450   CBC    Component Value Date/Time   WBC 10.0 01/22/2015 0610   RBC 3.34* 01/22/2015 0610   HGB 9.8* 01/22/2015 0610   HCT 29.2* 01/22/2015 0610   PLT 331 01/22/2015 0610   MCV 87.4 01/22/2015 0610   MCH 29.3 01/22/2015 0610   MCHC 33.6 01/22/2015 0610   RDW 14.0 01/22/2015 0610   LYMPHSABS 1.2 01/17/2015 0420   MONOABS 1.6* 01/17/2015 0420   EOSABS 0.0 01/17/2015 0420   BASOSABS 0.0 01/17/2015 0420   CBG's (last 3):  No results for input(s): GLUCAP in the last 72 hours. LFT's Lab  Results  Component Value Date   ALT 10* 01/17/2015   AST 32 01/17/2015   ALKPHOS 171* 01/17/2015   BILITOT 0.5 01/17/2015    Studies/Results: No results found.  Medications:  I have reviewed the patient's current medications. Scheduled Medications: . bacitracin   Topical BID  . carbidopa-levodopa  2 tablet Oral TID  . cefTRIAXone (ROCEPHIN)  IV  2 g Intravenous Q24H  . clonazePAM  0.5 mg Oral QHS  . feeding supplement (PRO-STAT SUGAR FREE 64)  30 mL Oral BID BM  . furosemide  20 mg Oral Daily  . potassium chloride  20 mEq Oral BID  . trihexyphenidyl  2 mg Oral BID WC   PRN Medications: acetaminophen, alum & mag hydroxide-simeth, bisacodyl, diphenhydrAMINE, guaiFENesin-dextromethorphan, ondansetron **OR** ondansetron (ZOFRAN) IV  Assessment/Plan: Principal Problem:   Debility Active Problems:   Pyelonephritis   Acute renal failure syndrome   Metabolic encephalopathy   Parkinson's disease   Hypokalemia   Edema--BLE   Leucocytosis   Bacteremia due to Escherichia coli   1. Functional deficits secondary to Encephalopathy and debility after sepsis -steady gradual cognitive improvement, requires supervision at home after discharge, but not expected to be long term. 2. DVT Prophylaxis/Anticoagulation: Mechanical: Sequential compression devices, below knee Bilateral lower extremities 3. Pain Management: Tylenol prn for pain 4. Mood: LCSW to follow with patient and husband for support. 5.  Neuropsych: This patient is not capable of making decisions on her own behalf. 6. Skin/Wound Care: fingers and toes continue to evolve---remain clean -padding, local care between toes and fingers, keep clean  -PM&R will help follow in office -ACE, prn lasix for swelling 7. Fluids/Electrolytes/Nutrition: encourage po.  8. Left ureteral stone: Left nephrostomy tube in place with good UOP. 9. AKI: Resolved post L-nephrostomy tube. BUN/CR have  normalized 10 sepsis due to E coli bacteremia/UTI: abx complete.  11. Parkinson's disease: On Sinemet 50/200 tid and artane bid  13. Acute respiratory failure: Resolved.  14. Insomnia: klonopin.  15. Thrombocytopenia: platelets recovering---331k  Length of stay, days: 9  Stable for DC home today as planned - OP follow up as reviewed DC Summary per PM&R team  Noreen Mackintosh A. Asa Lente, MD 01/25/2015, 9:25 AM

## 2015-01-25 NOTE — Progress Notes (Signed)
Received order for patient to be discharged home. Spouse, Becky Rogers present and had no questions. Patient taken out to vehicle in wheelchair.

## 2015-01-25 NOTE — Progress Notes (Signed)
Social Work  Discharge Note  The overall goal for the admission was met for:   Discharge location: Yes - home with husband and family providing 24/7 supervision  Length of Stay: Yes - 9 days  Discharge activity level: Yes - supervision  Home/community participation: Yes  Services provided included: MD, RD, PT, OT, SLP, RN, TR, Pharmacy, Neuropsych and SW  Financial Services: Private Insurance: [employer]  Follow-up services arranged: Home Health: Therapist, sports, ST via [facility], Other: provided verbal and written information to pt and husband on local Adult Day Programs and support groups and Patient/Family has no preference for HH/DME agencies  Comments (or additional information):  Patient/Family verbalized understanding of follow-up arrangements: Yes  Individual responsible for coordination of the follow-up plan: pt/ husband  Confirmed correct DME delivered: NA    Becky Rogers

## 2015-01-27 DIAGNOSIS — I998 Other disorder of circulatory system: Secondary | ICD-10-CM | POA: Diagnosis present

## 2015-01-27 NOTE — Discharge Summary (Signed)
Physician Discharge Summary  Patient ID: Becky Rogers MRN: [id number] DOB/AGE: [birth year] 64 y.o.  Admit date: 01/16/2015 Discharge date: 01/25/2015  Discharge Diagnoses:  Principal Problem:   Debility Active Problems:   Pyelonephritis   Acute renal failure syndrome   Metabolic encephalopathy   Parkinson's disease   Hypokalemia   Edema--BLE   Leucocytosis   Bacteremia due to Escherichia coli   Ischemia of toes bilaterally   Ischemia of fingers--bilaterally   Discharged Condition: Stable    Labs:  Basic Metabolic Panel:  Recent Labs Lab 01/22/15 0610 01/23/15 0450  NA 141 144  K 2.8* 3.7  CL 108 112*  CO2 26 26  GLUCOSE 91 83  BUN 22* 18  CREATININE 0.83 0.63  CALCIUM 7.3* 7.6*    CBC:  Recent Labs Lab 01/22/15 0610  WBC 10.0  HGB 9.8*  HCT 29.2*  MCV 87.4  PLT 331    CBG: No results for input(s): GLUCAP in the last 168 hours.  Brief HPI:   Becky Rogers is a 64 y.o. female with history of Parkinson's disease with mild dementia, breast cancer, HTN who was admitted 01/09/15 with abdominal pain, acute renal failure and hypotension due to urosepsis. She was started on pressors, solucortef and fluids as well as broad spectrum antibiotics for septic shock. CT revealed left ureteral stone with concerns for obstructed pyonephrosis and underwent left percutaneous nephrostomy by interventional radiology. Dr. Alinda Money recommended 14 days of culture specific antibiotics prior to any definitive stone procedure and follow up with Dr. Tresa Moore on outpatient basis. Antibiotics changed to ceftriaxone for pansensitive E coli bacteremia. Mentation is improving. Acute renal failure slowly improving and thrombocytopenia/coagulopathy resolving. Ischemic injury to bilateral finger and toes/feet being monitored for demarcation/gangrenous changes. Swallow evaluation revealed mild cognitively based dysphagia and was placed on esophageal/aspiration precautions. Therapy ongoing and CIR  recommended by MD and Rehab team.    Hospital Course: Evans Lance was admitted to rehab 01/16/2015 for inpatient therapies to consist of PT, ST and OT at least three hours five days a week. Past admission physiatrist, therapy team and rehab RN have worked together to provide customized collaborative inpatient rehab. Cognition and awareness has continued to improve during her rehab stay.  Ischemic areas on bilateral toes and hands started to evolve and developed blisters. As blisters ruptured, denuded areas wered  treated locally with bacitracin ointment and dry dressing. No signs of infection noted and HHRN has been arranged to follow up on wounds past discharge. Nutritional supplements were added to help with wound healing. She has completed 14 day course of antibiotics for urosepsis due to E coli bacteremia. She is voiding without difficulty and no dysuria reported. Reactive leucocytosis has resolved with resolution of thrombocytopenia.  Hypokalemia has resolved with supplementation and po intake has improved.  She continue to have BLE edema not managed by compression stockings and elevation. She was started on short course of lasix to help manage edema. She has made steady progress to supervision level. She will continue to receive follow up HHST and [place] [facility] after discharge.    Rehab course: During patient's stay in rehab weekly team conferences were held to monitor patient's progress, set goals and discuss barriers to discharge. At admission, patient required min assist with mobility and min assist with mod to max cues to complete ADL tasks. She has had improvement in activity tolerance, balance, postural control, as well as ability to compensate for deficits. She is able to complete ADL  tasks with supervision. She is modified independent for transfer and ambulation in supervised setting. she requires supervision for ambulating in community or unfamiliar environment.  Her cognition  continues to vary at times and she requires min to moderate assistance cognitively due to deficits in selective attention, functional problem solving, impaired recall and decreased awareness of deficits.     Disposition: 01-Home or Self Care  Diet: Regular   Special Instructions: 1. Follow up with PMD for monitoring of healing of fingers and toes. Contact MD if you develop drainage, increase in pain, redness, fevers or chills.  2.  Keep legs elevated when seated and use ace wraps for edema control.  3. Push po fluids while on lasix.   4. Needs to have CBC and BMET checked on post hospital follow up.      Medication List    STOP taking these medications        amLODipine 5 MG tablet  Commonly known as:  NORVASC     aspirin 81 MG tablet     cefTRIAXone 40 MG/ML IVPB  Commonly known as:  ROCEPHIN     fish oil-omega-3 fatty acids 1000 MG capsule     HYDROcodone-acetaminophen 5-325 MG per tablet  Commonly known as:  NORCO     hydrocortisone sodium succinate 100 MG Solr injection  Commonly known as:  SOLU-CORTEF      TAKE these medications        atorvastatin 40 MG tablet  Commonly known as:  LIPITOR  Take 40 mg by mouth daily.     CALCIUM-VITAMIN D PO  Take 2 tablets by mouth daily.     carbidopa-levodopa 25-100 MG per tablet  Commonly known as:  SINEMET IR  Take 2 tablets by mouth 3 (three) times daily.     clonazePAM 0.5 MG tablet  Commonly known as:  KLONOPIN  Take 0.5 mg by mouth at bedtime.     furosemide 20 MG tablet  Commonly known as:  LASIX  Take 1 tablet (20 mg total) by mouth daily. For 5-7 days till leg swelling goes down     multivitamins ther. w/minerals Tabs tablet  Take 1 tablet by mouth daily.     potassium chloride SA 20 MEQ tablet  Commonly known as:  K-DUR,KLOR-CON  Take 1 tablet (20 mEq total) by mouth 2 (two) times daily.     TRIHEXYPHENIDYL HCL PO  Take 1 tablet by mouth 2 (two) times daily.       Follow-up Information    Follow  up with [name], MD On 03/24/2015.   Specialty:  Physical Medicine and Rehabilitation   Why:  Be there at 10:30  for 11 am  appointment    Contact information:   [place]. [address] [place] [address] [phone]       Follow up with [name], MD On 02/03/2015.   Specialty:  Urology   Why:  call for time   Contact information:   [place] [address] [phone]       Call Mosetta Anis, MD.   Specialty:  Neurology   Why:  for follow up appointment   Contact information:   [place] [address] [phone]       Follow up with Bing Matter, PA-C On 02/04/2015.   Specialty:  Family Medicine   Why:  BE there at 11:30 for 11:50 am appointment/Needs to have Labs checked (BMET)   Contact information:  [place] [address] [phone]       Call Marlane Hatcher, PsyD.   Specialty:  Psychology   Why:  follow up in 6-12 months for neuropsychological testing   Contact information:   [address] [place] [address]       Signed: Bary Leriche 01/27/2015, 10:37 AM

## 2015-01-30 NOTE — Consult Note (Signed)
PSYCHOSOCIAL NOTE - CONFIDENTIAL  [facility]  MEDICAL NECESSITY:  Mrs. Skluzacek was seen to provide her with feedback regarding her recent neuropsychological screening in the setting of Parkinson's disease and recent infection. Her husband and daughter were present for this session.    IMPRESSIONS:  Ms. Insco overall neurocognitive profile included mostly impaired scores, at the level of a Major Neurocognitive Disorder.  In comparison to prior test results, her current scores represent a stark decline over the past year in almost every area.  The exact etiology of her deficits is less clear and could be multifactorial, including potential contribution from Parkinson's disease, recent infection, and kidney disease.  We also continue to be unable to fully rule out dementia with Lewy Bodies, though owing to her recent reported history and lack of fluctuating cognitive symptoms, this is less likely.  Although she endorsed a few symptoms of depression, which could be exacerbating cognitive problems, it is unlikely that mood disruption alone is the driving force behind her cognitive dysfunction.  Given that a significant portion of her cognitive disruption could be secondary to recent infection or other disease processes that are being treated, it is possible that her cognitive functioning could recover to some extent over time.  Serial re-evaluation will be beneficial in evaluating for interval change in order to continue to provide recommendations for care management.   In light of these findings, the following recommendations were provided.    RECOMMENDATIONS:  Recommendations for treatment team:   . When interacting with Ms. Mccaffery, directions and information should be provided in a simple, straight forward manner, and the treatment team should avoid giving multiple instructions simultaneously.  . Ms. Beaston may also benefit from being provided with multiple trials to learn new  skills given the noted memory inefficiencies.  . Since emotional factors are likely adversely impacting the patient's daily life, brief counseling for social support seems warranted during this hospitalization.  . To the extent possible, multitasking should be avoided. . Ms. Lauritsen requires more time than typical to process information. The treatment team may benefit from waiting for a verbal response to information before presenting additional information.  . Performance will generally be best in a structured, routine, and familiar environment, as opposed to situations involving complex problems.   Recommendations for discharge planning:  . Complete a comprehensive neuropsychological evaluation as an outpatient in 6-12 months to assess for interval change. . Maintain engagement in mentally, physically and cognitively stimulating activities.  . Strive to maintain a healthy lifestyle (e.g., proper diet and exercise) in order to promote physical, cognitive and emotional health.  . Due to the nature and severity of the symptoms noted during this evaluation, it is recommended that Ms. Shankland initially obtain constant care and supervision following this hospitalization.  . Establishing a power of attorney is warranted.  . Ms. Zamorano should refrain from driving at this time.    Becky Rogers appeared to understand the results as presented and agreed to all recommendations. Afterward, time for questions was provided and when it was indicated that all questions were answered, the feedback session was concluded. Greater than 50% of this visit was spent educating the patient about the possible diagnosis, prognosis, management plan, and in coordination of care.   REFERRING DIAGNOSES:  Parkinson's disease Metabolic encephalopathy  FINAL DIAGNOSES:  Parkinson's disease Major Neurocognitive Disorder, unspecified  Total professional time including medical record review and feedback equals 1 unit of  [id number].  Marlane Hatcher, Psy.D.  Clinical Neuropsychologist  [address] [place], [place]  [address] Phone: [phone] Fax: [phone]

## 2015-02-03 DIAGNOSIS — S92502A Displaced unspecified fracture of left lesser toe(s), initial encounter for closed fracture: Secondary | ICD-10-CM | POA: Insufficient documentation

## 2015-02-04 ENCOUNTER — Encounter (HOSPITAL_COMMUNITY): Payer: Self-pay | Admitting: *Deleted

## 2015-02-04 ENCOUNTER — Other Ambulatory Visit: Payer: Self-pay | Admitting: Urology

## 2015-02-04 DIAGNOSIS — L089 Local infection of the skin and subcutaneous tissue, unspecified: Secondary | ICD-10-CM | POA: Insufficient documentation

## 2015-02-04 DIAGNOSIS — R011 Cardiac murmur, unspecified: Secondary | ICD-10-CM | POA: Insufficient documentation

## 2015-02-04 DIAGNOSIS — S99922A Unspecified injury of left foot, initial encounter: Secondary | ICD-10-CM | POA: Insufficient documentation

## 2015-02-04 DIAGNOSIS — S60949A Unspecified superficial injury of unspecified finger, initial encounter: Secondary | ICD-10-CM

## 2015-02-04 DIAGNOSIS — F419 Anxiety disorder, unspecified: Secondary | ICD-10-CM | POA: Insufficient documentation

## 2015-02-04 DIAGNOSIS — R413 Other amnesia: Secondary | ICD-10-CM | POA: Insufficient documentation

## 2015-02-04 DIAGNOSIS — L821 Other seborrheic keratosis: Secondary | ICD-10-CM | POA: Insufficient documentation

## 2015-02-04 DIAGNOSIS — D485 Neoplasm of uncertain behavior of skin: Secondary | ICD-10-CM | POA: Insufficient documentation

## 2015-02-04 DIAGNOSIS — R251 Tremor, unspecified: Secondary | ICD-10-CM | POA: Insufficient documentation

## 2015-02-05 ENCOUNTER — Encounter (HOSPITAL_COMMUNITY): Payer: Self-pay | Admitting: *Deleted

## 2015-02-05 ENCOUNTER — Emergency Department (HOSPITAL_COMMUNITY)
Admission: EM | Admit: 2015-02-05 | Discharge: 2015-02-06 | Disposition: A | Discharge status: Home / Self Care | Payer: BLUE CROSS/BLUE SHIELD | Attending: Emergency Medicine | Admitting: Emergency Medicine

## 2015-02-05 DIAGNOSIS — I10 Essential (primary) hypertension: Secondary | ICD-10-CM | POA: Insufficient documentation

## 2015-02-05 DIAGNOSIS — E780 Pure hypercholesterolemia: Secondary | ICD-10-CM | POA: Insufficient documentation

## 2015-02-05 DIAGNOSIS — G3183 Dementia with Lewy bodies: Secondary | ICD-10-CM | POA: Diagnosis not present

## 2015-02-05 DIAGNOSIS — R197 Diarrhea, unspecified: Secondary | ICD-10-CM | POA: Diagnosis present

## 2015-02-05 DIAGNOSIS — Z79899 Other long term (current) drug therapy: Secondary | ICD-10-CM | POA: Diagnosis not present

## 2015-02-05 DIAGNOSIS — Z853 Personal history of malignant neoplasm of breast: Secondary | ICD-10-CM | POA: Diagnosis not present

## 2015-02-05 DIAGNOSIS — Z8742 Personal history of other diseases of the female genital tract: Secondary | ICD-10-CM | POA: Insufficient documentation

## 2015-02-05 DIAGNOSIS — Z792 Long term (current) use of antibiotics: Secondary | ICD-10-CM | POA: Diagnosis not present

## 2015-02-05 DIAGNOSIS — K529 Noninfective gastroenteritis and colitis, unspecified: Secondary | ICD-10-CM | POA: Insufficient documentation

## 2015-02-05 DIAGNOSIS — R011 Cardiac murmur, unspecified: Secondary | ICD-10-CM | POA: Insufficient documentation

## 2015-02-05 DIAGNOSIS — Z872 Personal history of diseases of the skin and subcutaneous tissue: Secondary | ICD-10-CM | POA: Insufficient documentation

## 2015-02-05 DIAGNOSIS — F419 Anxiety disorder, unspecified: Secondary | ICD-10-CM | POA: Diagnosis not present

## 2015-02-05 DIAGNOSIS — Z8619 Personal history of other infectious and parasitic diseases: Secondary | ICD-10-CM | POA: Insufficient documentation

## 2015-02-05 LAB — CBC
HEMATOCRIT: 36.7 % (ref 36.0–46.0)
HEMOGLOBIN: 12.1 g/dL (ref 12.0–15.0)
MCH: 29.5 pg (ref 26.0–34.0)
MCHC: 33 g/dL (ref 30.0–36.0)
MCV: 89.5 fL (ref 78.0–100.0)
Platelets: 265 10*3/uL (ref 150–400)
RBC: 4.1 MIL/uL (ref 3.87–5.11)
RDW: 13.6 % (ref 11.5–15.5)
WBC: 13.2 10*3/uL — ABNORMAL HIGH (ref 4.0–10.5)

## 2015-02-05 LAB — URINALYSIS, ROUTINE W REFLEX MICROSCOPIC
Bilirubin Urine: NEGATIVE
GLUCOSE, UA: NEGATIVE mg/dL
Hgb urine dipstick: NEGATIVE
KETONES UR: NEGATIVE mg/dL
NITRITE: NEGATIVE
PROTEIN: NEGATIVE mg/dL
SPECIFIC GRAVITY, URINE: 1.004 — AB (ref 1.005–1.030)
UROBILINOGEN UA: 0.2 mg/dL (ref 0.0–1.0)
pH: 5 (ref 5.0–8.0)

## 2015-02-05 LAB — COMPREHENSIVE METABOLIC PANEL
ALT: 5 U/L — ABNORMAL LOW (ref 14–54)
AST: 16 U/L (ref 15–41)
Albumin: 3.3 g/dL — ABNORMAL LOW (ref 3.5–5.0)
Alkaline Phosphatase: 78 U/L (ref 38–126)
Anion gap: 11 (ref 5–15)
BUN: 18 mg/dL (ref 6–20)
CO2: 22 mmol/L (ref 22–32)
Calcium: 9 mg/dL (ref 8.9–10.3)
Chloride: 103 mmol/L (ref 101–111)
Creatinine, Ser: 1.02 mg/dL — ABNORMAL HIGH (ref 0.44–1.00)
GFR calc non Af Amer: 57 mL/min — ABNORMAL LOW (ref >60)
GLUCOSE: 102 mg/dL — AB (ref 65–99)
Potassium: 4.2 mmol/L (ref 3.5–5.1)
SODIUM: 136 mmol/L (ref 135–145)
Total Bilirubin: 0.8 mg/dL (ref 0.3–1.2)
Total Protein: 6.3 g/dL — ABNORMAL LOW (ref 6.5–8.1)

## 2015-02-05 LAB — URINE MICROSCOPIC-ADD ON

## 2015-02-05 LAB — I-STAT CG4 LACTIC ACID, ED: Lactic Acid, Venous: 0.67 mmol/L (ref 0.5–2.0)

## 2015-02-05 LAB — LIPASE, BLOOD: Lipase: 73 U/L — ABNORMAL HIGH (ref 22–51)

## 2015-02-05 MED ORDER — SODIUM CHLORIDE 0.9 % IV BOLUS (SEPSIS)
1000.0000 mL | Freq: Once | INTRAVENOUS | Status: AC
  Administered 2015-02-05: 1000 mL via INTRAVENOUS

## 2015-02-05 MED ORDER — IOHEXOL 300 MG/ML  SOLN
25.0000 mL | INTRAMUSCULAR | Status: AC
  Administered 2015-02-05: 25 mL via ORAL

## 2015-02-05 MED ORDER — ONDANSETRON HCL 4 MG/2ML IJ SOLN
4.0000 mg | Freq: Once | INTRAMUSCULAR | Status: AC
  Administered 2015-02-05: 4 mg via INTRAVENOUS
  Filled 2015-02-05: qty 2

## 2015-02-05 NOTE — Progress Notes (Signed)
Spoke with husband and completed preop information to include history, medications and instructions.  Called [facility] in [place] to obtain LOV note.

## 2015-02-05 NOTE — ED Notes (Addendum)
Pt and family reports pt was admitted on 6/30 for severe sepsis due to multiple kidney stones. Pt scheduled to have stones removed this Friday but pt now having diarrhea x 2 days, worried about c diff due to antibiotic treatment. Having nausea and lack of appetite, denies vomiting.

## 2015-02-05 NOTE — ED Provider Notes (Signed)
CSN: [id number]     Arrival date & time 02/05/15  1741 History   First MD Initiated Contact with Patient 02/05/15 2007     Chief Complaint  Patient presents with  . Diarrhea  . Nausea     (Consider location/radiation/quality/duration/timing/severity/associated sxs/prior Treatment) HPI Comments: Patient is a 64 yo F PMHx significant for recent admission for urosepsis s/p left neph tube 01/10/15 for urgent decompression presenting to the emergency department for evaluation of 2 day history of diarrhea and nausea. Patient states she's had approximately 10 episodes of nonbloody diarrhea a day. She does endorse mild cramping abdominal pain with each episode of diarrhea. Denies any vomiting, fever. Patient is due to have surgery on Friday by Dr. Tresa Moore. She's no longer on antibiotics.  Patient is a 64 y.o. female presenting with diarrhea. The history is provided by the patient and the spouse.  Diarrhea Quality:  Watery Severity:  Severe Onset quality:  Sudden Number of episodes:  20 Duration:  2 days Timing:  Constant Progression:  Worsening Relieved by:  None tried Worsened by:  Nothing tried Ineffective treatments:  None tried Associated symptoms: no recent cough, no fever and no vomiting   Risk factors: recent antibiotic use     Past Medical History  Diagnosis Date  . Hypertension   . Hypercholesteremia   . Parkinson disease   . Breast cancer     right  . Anxiety   . Sepsis     hx of   . Pyelonephritis   . Keratosis seborrheica   . Metabolic encephalopathy admitted 01/10/2015 at [facility]   . Dementia     per note of 02/04/15   . Heart murmur     undiagnosed per note 02/04/15   . Erythema   . Skin ulcer     healing on left second toe per office visit note of 02/04/2015    Past Surgical History  Procedure Laterality Date  . Cesarean section      x2  . Ovarian cyst removal      age 81  . Appendectomy    . Colonoscopy    . Dental surgery      implant  . Breast lumpectomy with  radioactive seed localization Right 08/28/2014    Procedure: RIGHT BREAST LUMPECTOMY WITH RADIOACTIVE SEED LOCALIZATION;  Surgeon: [name], MD;  Location: [facility];  Service: General;  Laterality: Right;   History reviewed. No pertinent family history. History  Substance Use Topics  . Smoking status: Never Smoker   . Smokeless tobacco: Never Used  . Alcohol Use: Yes     Comment: occasion   OB History    No data available     Review of Systems  Constitutional: Negative for fever.  Gastrointestinal: Positive for diarrhea. Negative for vomiting.  All other systems reviewed and are negative.     Allergies  Review of patient's allergies indicates no known allergies.  Home Medications   Prior to Admission medications   Medication Sig Start Date End Date Taking? Authorizing Provider  atorvastatin (LIPITOR) 40 MG tablet Take 40 mg by mouth daily.   Yes Historical Provider, MD  CALCIUM-VITAMIN D PO Take 1 tablet by mouth daily.    Yes Historical Provider, MD  carbidopa-levodopa (SINEMET IR) 25-100 MG per tablet Take 2 tablets by mouth 3 (three) times daily.   Yes Historical Provider, MD  cephALEXin (KEFLEX) 500 MG capsule Take 500 mg by mouth 2 (two) times daily. To start 3 days prior to  surgery   Yes Historical Provider, MD  clonazePAM (KLONOPIN) 0.5 MG tablet Take 0.5 mg by mouth at bedtime.   Yes Historical Provider, MD  furosemide (LASIX) 20 MG tablet Take 1 tablet (20 mg total) by mouth daily. For 5-7 days till leg swelling goes down 01/24/15  Yes Ivan Anchors Love, PA-C  Multiple Vitamins-Minerals (MULTIVITAMINS THER. W/MINERALS) TABS Take 1 tablet by mouth daily.   Yes Historical Provider, MD  Omega-3 Fatty Acids (FISH OIL) 1000 MG CAPS Take 1,000 mg by mouth 2 (two) times daily.   Yes Historical Provider, MD  potassium chloride SA (K-DUR,KLOR-CON) 20 MEQ tablet Take 1 tablet (20 mEq total) by mouth 2 (two) times daily. 01/24/15  Yes Ivan Anchors Love, PA-C   Probiotic Product (PROBIOTIC DAILY) CAPS Take 1 capsule by mouth daily.   Yes Historical Provider, MD  trihexyphenidyl (ARTANE) 2 MG tablet Take 3 mg by mouth daily.  11/22/14  Yes Historical Provider, MD   BP 122/56 mmHg  Pulse 70  Temp(Src) 99.1 F (37.3 C) (Oral)  Resp 18  Ht 5' 1.5" (1.562 m)  Wt 101 lb 8 oz (46.04 kg)  BMI 18.87 kg/m2  SpO2 98% Physical Exam  Constitutional: She is oriented to person, place, and time. She appears well-developed and well-nourished. No distress.  HENT:  Head: Normocephalic and atraumatic.  Right Ear: External ear normal.  Left Ear: External ear normal.  Nose: Nose normal.  Mouth/Throat: Oropharynx is clear and moist.  Eyes: Conjunctivae are normal.  Neck: Normal range of motion. Neck supple.  No nuchal rigidity.   Cardiovascular: Normal rate, regular rhythm and normal heart sounds.   Pulmonary/Chest: Effort normal and breath sounds normal. No respiratory distress.  Abdominal: Soft. Bowel sounds are normal. There is no tenderness. There is no rigidity and no CVA tenderness.  Musculoskeletal: Normal range of motion.  Neurological: She is alert and oriented to person, place, and time.  Skin: She is not diaphoretic.  Psychiatric: She has a normal mood and affect.  Nursing note and vitals reviewed.   ED Course  Procedures (including critical care time) Medications  iohexol (OMNIPAQUE) 300 MG/ML solution 25 mL (25 mLs Oral Contrast Given 02/05/15 2120)  sodium chloride 0.9 % bolus 1,000 mL (0 mLs Intravenous Stopped 02/05/15 2216)  ondansetron (ZOFRAN) injection 4 mg (4 mg Intravenous Given 02/05/15 2120)  sodium chloride 0.9 % bolus 1,000 mL (0 mLs Intravenous Stopped 02/06/15 0240)  iohexol (OMNIPAQUE) 300 MG/ML solution 100 mL (100 mLs Intravenous Contrast Given 02/06/15 0029)   Labs Review Labs Reviewed  LIPASE, BLOOD - Abnormal; Notable for the following:    Lipase 73 (*)    All other components within normal limits  COMPREHENSIVE  METABOLIC PANEL - Abnormal; Notable for the following:    Glucose, Bld 102 (*)    Creatinine, Ser 1.02 (*)    Total Protein 6.3 (*)    Albumin 3.3 (*)    ALT 5 (*)    GFR calc non Af Amer 57 (*)    All other components within normal limits  CBC - Abnormal; Notable for the following:    WBC 13.2 (*)    All other components within normal limits  URINALYSIS, ROUTINE W REFLEX MICROSCOPIC (NOT AT [facility]) - Abnormal; Notable for the following:    APPearance CLOUDY (*)    Specific Gravity, Urine 1.004 (*)    Leukocytes, UA TRACE (*)    All other components within normal limits  URINE MICROSCOPIC-ADD ON - Abnormal; Notable for  the following:    Casts HYALINE CASTS (*)    All other components within normal limits  STOOL CULTURE  CLOSTRIDIUM DIFFICILE BY PCR (NOT AT [facility])  URINE CULTURE  I-STAT CG4 LACTIC ACID, ED    Imaging Review Ct Abdomen Pelvis W Contrast  02/06/2015   CLINICAL DATA:  Prior sepsis due to obstructive uropathy. Now with diarrhea, possibly related antibiotics.  EXAM: CT ABDOMEN AND PELVIS WITH CONTRAST  TECHNIQUE: Multidetector CT imaging of the abdomen and pelvis was performed using the standard protocol following bolus administration of intravenous contrast.  CONTRAST:  141mL OMNIPAQUE IOHEXOL 300 MG/ML  SOLN  COMPARISON:  01/09/2015  FINDINGS: There is abnormal mural thickening of the rectum, sigmoid and distal descending colon. This may represent colitis. The right hemicolon, transverse colon and sigmoid flexure are normal. There is no obstruction. There is no extraluminal air. There is no ascites. Stomach and small bowel are normal.  There is a left percutaneous nephrostomy with coiled end in the left renal pelvis. There is a 6 x 7 mm proximal left ureteral calculus just beyond the ureteropelvic junction. No other urinary calculi are evident, but there is mild limitation due the presence of intravenous contrast. There are unremarkable appearances of the liver, spleen, pancreas  and adrenals. Abdominal aorta is normal in caliber with mild atherosclerotic calcification. Uterus and ovaries appear unremarkable. There is no significant abnormality in the lower chest. No significant musculoskeletal abnormality is evident.  IMPRESSION: 1. Abnormal mural thickening of the rectum, sigmoid and descending colon. This may represent colitis. No bowel obstruction or perforation. 2. 6 x 7 mm proximal left ureteral calculus. Left percutaneous nephrostomy.   Electronically Signed   By: Andreas Newport M.D.   On: 02/06/2015 01:31     EKG Interpretation None      MDM   Final diagnoses:  Diarrhea  Colitis   Filed Vitals:   02/06/15 0230  BP: 122/56  Pulse: 70  Temp:   Resp:    Afebrile, NAD, non-toxic appearing, AAOx4.   I have reviewed nursing notes, vital signs, and all lab and all imaging results as noted above.   Patient presented to the emergency department for evaluation of 2 days of numerous episodes of nonbloody diarrhea. On initial examination patient is well-appearing. Her vital signs are stable. Abdomen is soft, nontender, nondistended. Left nephrostomy tube is in place without drainage or surrounding erythema. Clear yellow urine noted in back. Labs reviewed without gross abnormality. UA obtained with 3-6 WBCs on microscopic but nitrate negative. Culture sent. Stool culture sent. CT scan reviewed with evidence of colitis, nephrolithiasis again noted. On reevaluation patient is still well appearing with a benign abdominal exam. Will discharge home with strict return precautions. Discussed with patient we will call for any positive stool cultures. Patient and family is agreeable to plan and stable at time of discharge. Patient d/w with Dr. Tamera Punt, agrees with plan.    Baron Sane, PA-C 02/06/15 6837  Malvin Johns, MD 02/06/15 2120

## 2015-02-06 ENCOUNTER — Telehealth: Payer: Self-pay | Admitting: Emergency Medicine

## 2015-02-06 ENCOUNTER — Emergency Department (HOSPITAL_COMMUNITY): Payer: BLUE CROSS/BLUE SHIELD

## 2015-02-06 LAB — CLOSTRIDIUM DIFFICILE BY PCR: CDIFFPCR: POSITIVE — AB

## 2015-02-06 MED ORDER — IOHEXOL 300 MG/ML  SOLN
100.0000 mL | Freq: Once | INTRAMUSCULAR | Status: AC | PRN
  Administered 2015-02-06: 100 mL via INTRAVENOUS

## 2015-02-06 NOTE — Progress Notes (Addendum)
Faxed copy of labs Cmet and CBC w diff  WBC 13.8 from [facility] to Dr. Zettie Pho office and called left message about the mentioned lab results.

## 2015-02-06 NOTE — Telephone Encounter (Signed)
Lab called positive c-diff Chart sent to EDP for review

## 2015-02-06 NOTE — Discharge Instructions (Signed)
Please follow up with your primary care physician in 1-2 days. If you do not have one please call the [facility] number listed above. Please read all discharge instructions and return precautions.   Colitis Colitis is inflammation of the colon. Colitis can be a short-term or long-standing (chronic) illness. Crohn's disease and ulcerative colitis are 2 types of colitis which are chronic. They usually require lifelong treatment. CAUSES  There are many different causes of colitis, including:  Viruses.  Germs (bacteria).  Medicine reactions. SYMPTOMS   Diarrhea.  Intestinal bleeding.  Pain.  Fever.  Throwing up (vomiting).  Tiredness (fatigue).  Weight loss.  Bowel blockage. DIAGNOSIS  The diagnosis of colitis is based on examination and stool or blood tests. X-rays, CT scan, and colonoscopy may also be needed. TREATMENT  Treatment may include:  Fluids given through the vein (intravenously).  Bowel rest (nothing to eat or drink for a period of time).  Medicine for pain and diarrhea.  Medicines (antibiotics) that kill germs.  Cortisone medicines.  Surgery. HOME CARE INSTRUCTIONS   Get plenty of rest.  Drink enough water and fluids to keep your urine clear or pale yellow.  Eat a well-balanced diet.  Call your caregiver for follow-up as recommended. SEEK IMMEDIATE MEDICAL CARE IF:   You develop chills.  You have an oral temperature above 102 F (38.9 C), not controlled by medicine.  You have extreme weakness, fainting, or dehydration.  You have repeated vomiting.  You develop severe belly (abdominal) pain or are passing bloody or tarry stools. MAKE SURE YOU:   Understand these instructions.  Will watch your condition.  Will get help right away if you are not doing well or get worse. Document Released: 08/05/2004 Document Revised: 09/20/2011 Document Reviewed: 10/31/2009 [facility] Patient Information 2015 [place], [place]. This  information is not intended to replace advice given to you by your health care provider. Make sure you discuss any questions you have with your health care provider.

## 2015-02-07 ENCOUNTER — Ambulatory Visit (HOSPITAL_COMMUNITY): Payer: BLUE CROSS/BLUE SHIELD | Admitting: Certified Registered Nurse Anesthetist

## 2015-02-07 ENCOUNTER — Ambulatory Visit (HOSPITAL_COMMUNITY)
Admission: RE | Admit: 2015-02-07 | Discharge: 2015-02-07 | Disposition: A | Discharge status: Home / Self Care | Payer: BLUE CROSS/BLUE SHIELD | Source: Ambulatory Visit | Attending: Urology | Admitting: Urology

## 2015-02-07 ENCOUNTER — Encounter (HOSPITAL_COMMUNITY): Payer: Self-pay | Admitting: *Deleted

## 2015-02-07 ENCOUNTER — Encounter (HOSPITAL_COMMUNITY)
Admission: RE | Disposition: A | Discharge status: Home / Self Care | Payer: Self-pay | Source: Ambulatory Visit | Attending: Urology

## 2015-02-07 DIAGNOSIS — F0390 Unspecified dementia without behavioral disturbance: Secondary | ICD-10-CM | POA: Insufficient documentation

## 2015-02-07 DIAGNOSIS — E780 Pure hypercholesterolemia: Secondary | ICD-10-CM | POA: Insufficient documentation

## 2015-02-07 DIAGNOSIS — G20 Parkinson's disease: Secondary | ICD-10-CM | POA: Insufficient documentation

## 2015-02-07 DIAGNOSIS — I10 Essential (primary) hypertension: Secondary | ICD-10-CM | POA: Diagnosis not present

## 2015-02-07 DIAGNOSIS — L97529 Non-pressure chronic ulcer of other part of left foot with unspecified severity: Secondary | ICD-10-CM | POA: Insufficient documentation

## 2015-02-07 DIAGNOSIS — N179 Acute kidney failure, unspecified: Secondary | ICD-10-CM | POA: Insufficient documentation

## 2015-02-07 DIAGNOSIS — Z853 Personal history of malignant neoplasm of breast: Secondary | ICD-10-CM | POA: Insufficient documentation

## 2015-02-07 DIAGNOSIS — N202 Calculus of kidney with calculus of ureter: Secondary | ICD-10-CM | POA: Diagnosis not present

## 2015-02-07 DIAGNOSIS — F419 Anxiety disorder, unspecified: Secondary | ICD-10-CM | POA: Insufficient documentation

## 2015-02-07 DIAGNOSIS — N200 Calculus of kidney: Secondary | ICD-10-CM | POA: Diagnosis present

## 2015-02-07 HISTORY — PX: CYSTOSCOPY WITH RETROGRADE PYELOGRAM, URETEROSCOPY AND STENT PLACEMENT: SHX5789

## 2015-02-07 HISTORY — DX: Other seborrheic keratosis: L82.1

## 2015-02-07 HISTORY — DX: Erythematous condition, unspecified: L53.9

## 2015-02-07 HISTORY — DX: Anxiety disorder, unspecified: F41.9

## 2015-02-07 LAB — URINE CULTURE: Culture: NO GROWTH

## 2015-02-07 SURGERY — CYSTOURETEROSCOPY, WITH RETROGRADE PYELOGRAM AND STENT INSERTION
Anesthesia: General | Site: Ureter | Laterality: Left

## 2015-02-07 MED ORDER — PHENYLEPHRINE HCL 10 MG/ML IJ SOLN
INTRAMUSCULAR | Status: DC | PRN
  Administered 2015-02-07 (×2): 40 ug via INTRAVENOUS

## 2015-02-07 MED ORDER — FENTANYL CITRATE (PF) 100 MCG/2ML IJ SOLN
INTRAMUSCULAR | Status: AC
  Filled 2015-02-07: qty 2

## 2015-02-07 MED ORDER — ONDANSETRON HCL 4 MG/2ML IJ SOLN: 4.0000 mg | Freq: Once | INTRAMUSCULAR | Status: DC | PRN

## 2015-02-07 MED ORDER — PROPOFOL 10 MG/ML IV BOLUS
INTRAVENOUS | Status: AC
  Filled 2015-02-07: qty 20

## 2015-02-07 MED ORDER — FENTANYL CITRATE (PF) 100 MCG/2ML IJ SOLN: 25.0000 ug | INTRAMUSCULAR | Status: DC | PRN

## 2015-02-07 MED ORDER — GENTAMICIN SULFATE 40 MG/ML IJ SOLN
5.0000 mg/kg | INTRAVENOUS | Status: AC
  Administered 2015-02-07: 230 mg via INTRAVENOUS
  Filled 2015-02-07 (×2): qty 5.75

## 2015-02-07 MED ORDER — MIDAZOLAM HCL 2 MG/2ML IJ SOLN
INTRAMUSCULAR | Status: AC
  Filled 2015-02-07: qty 2

## 2015-02-07 MED ORDER — LACTATED RINGERS IV SOLN
INTRAVENOUS | Status: DC
  Administered 2015-02-07: 17:00:00 via INTRAVENOUS

## 2015-02-07 MED ORDER — LACTATED RINGERS IV SOLN
INTRAVENOUS | Status: DC | PRN
  Administered 2015-02-07: 15:00:00 via INTRAVENOUS

## 2015-02-07 MED ORDER — ONDANSETRON HCL 4 MG/2ML IJ SOLN
INTRAMUSCULAR | Status: AC
  Filled 2015-02-07: qty 2

## 2015-02-07 MED ORDER — SENNOSIDES-DOCUSATE SODIUM 8.6-50 MG PO TABS: 1.0000 | ORAL_TABLET | Freq: Two times a day (BID) | ORAL | Status: DC

## 2015-02-07 MED ORDER — SUCCINYLCHOLINE CHLORIDE 20 MG/ML IJ SOLN
INTRAMUSCULAR | Status: DC | PRN
  Administered 2015-02-07: 80 mg via INTRAVENOUS

## 2015-02-07 MED ORDER — FENTANYL CITRATE (PF) 100 MCG/2ML IJ SOLN
INTRAMUSCULAR | Status: DC | PRN
  Administered 2015-02-07: 50 ug via INTRAVENOUS

## 2015-02-07 MED ORDER — SODIUM CHLORIDE 0.9 % IR SOLN
Status: DC | PRN
  Administered 2015-02-07: 4000 mL

## 2015-02-07 MED ORDER — OXYCODONE-ACETAMINOPHEN 5-325 MG PO TABS: 1.0000 | ORAL_TABLET | Freq: Four times a day (QID) | ORAL | Status: DC | PRN

## 2015-02-07 MED ORDER — LIDOCAINE HCL (CARDIAC) 20 MG/ML IV SOLN
INTRAVENOUS | Status: DC | PRN
  Administered 2015-02-07: 50 mg via INTRAVENOUS

## 2015-02-07 MED ORDER — DEXAMETHASONE SODIUM PHOSPHATE 10 MG/ML IJ SOLN
INTRAMUSCULAR | Status: DC | PRN
  Administered 2015-02-07: 10 mg via INTRAVENOUS

## 2015-02-07 MED ORDER — IOHEXOL 300 MG/ML  SOLN
INTRAMUSCULAR | Status: DC | PRN
  Administered 2015-02-07: 16 mL via URETHRAL

## 2015-02-07 MED ORDER — MIDAZOLAM HCL 5 MG/5ML IJ SOLN
INTRAMUSCULAR | Status: DC | PRN
  Administered 2015-02-07: 1 mg via INTRAVENOUS

## 2015-02-07 MED ORDER — PROPOFOL 10 MG/ML IV BOLUS
INTRAVENOUS | Status: DC | PRN
  Administered 2015-02-07: 100 mg via INTRAVENOUS

## 2015-02-07 MED ORDER — LIDOCAINE HCL (CARDIAC) 20 MG/ML IV SOLN
INTRAVENOUS | Status: AC
  Filled 2015-02-07: qty 5

## 2015-02-07 MED ORDER — ONDANSETRON HCL 4 MG/2ML IJ SOLN
INTRAMUSCULAR | Status: DC | PRN
  Administered 2015-02-07: 4 mg via INTRAVENOUS

## 2015-02-07 MED ORDER — CEPHALEXIN 500 MG PO CAPS: 500.0000 mg | ORAL_CAPSULE | Freq: Two times a day (BID) | ORAL | Status: DC

## 2015-02-07 SURGICAL SUPPLY — 25 items
BASKET LASER NITINOL 1.9FR (BASKET) IMPLANT
BASKET STNLS GEMINI 4WIRE 3FR (BASKET) IMPLANT
BASKET ZERO TIP NITINOL 2.4FR (BASKET) IMPLANT
CATH INTERMIT  6FR 70CM (CATHETERS) ×4 IMPLANT
CLOTH BEACON ORANGE TIMEOUT ST (SAFETY) ×4 IMPLANT
DRSG TEGADERM 4X4.75 (GAUZE/BANDAGES/DRESSINGS) ×4 IMPLANT
ELECT REM PT RETURN 9FT ADLT (ELECTROSURGICAL)
ELECTRODE REM PT RTRN 9FT ADLT (ELECTROSURGICAL) IMPLANT
FIBER LASER FLEXIVA 1000 (UROLOGICAL SUPPLIES) IMPLANT
FIBER LASER FLEXIVA 200 (UROLOGICAL SUPPLIES) IMPLANT
FIBER LASER FLEXIVA 365 (UROLOGICAL SUPPLIES) IMPLANT
FIBER LASER FLEXIVA 550 (UROLOGICAL SUPPLIES) IMPLANT
FIBER LASER TRAC TIP (UROLOGICAL SUPPLIES) IMPLANT
GAUZE SPONGE 4X4 12PLY STRL (GAUZE/BANDAGES/DRESSINGS) ×4 IMPLANT
GLOVE BIOGEL M STRL SZ7.5 (GLOVE) ×4 IMPLANT
GOWN STRL REUS W/TWL XL LVL3 (GOWN DISPOSABLE) ×8 IMPLANT
GUIDEWIRE ANG ZIPWIRE 038X150 (WIRE) ×4 IMPLANT
GUIDEWIRE STR DUAL SENSOR (WIRE) ×4 IMPLANT
IV NS IRRIG 3000ML ARTHROMATIC (IV SOLUTION) ×4 IMPLANT
PACK CYSTO (CUSTOM PROCEDURE TRAY) ×4 IMPLANT
SHEATH ACCESS URETERAL 24CM (SHEATH) ×4 IMPLANT
STENT URET 6FRX24 CONTOUR (STENTS) ×4 IMPLANT
SYRINGE 10CC LL (SYRINGE) ×4 IMPLANT
SYRINGE IRR TOOMEY STRL 70CC (SYRINGE) IMPLANT
TUBE FEEDING 8FR 16IN STR KANG (MISCELLANEOUS) ×4 IMPLANT

## 2015-02-07 NOTE — H&P (Signed)
Becky Rogers is an 64 y.o. female.    Chief Complaint: Pre-op Left Ureteroscopic Stone Manipulation  HPI:   1- Left Ureteral Stone - s/p left neph tube 01/10/15 for urgent decompression from 7.2m left prox ureteral stone with mild-mod hydro and ipsilateral approx 326mlower pole stone by ER CT 01/09/15 on eval left abdominal pain and septic shock. No prior nephrolithiasis. No contralateral stones.  2 - Urosepsis - fevers, tachycardia, hypotension on ER presentation c/w sepsis 01/2015. [place], [place]sensitive e. Coli, now  with resolution of fevers.   3 -  Acute Renal Failure - Cr 2.9 on ER presentation up from baseline <1. Admits to some poor PO intake and now recent hypotention, left hydro. No right hydro or obvious renal atrophy.  Cr peak high 3's. R/u [place] neph tube placement with resolved left hydro and confirms no right sided. Cr 01/25/15 0.63, back at baseline with neph tube in place.   PMH sig for dementia (mil), cesarean x2. [facility]  Today Becky Rogers seen to proceed with left ureteroscopic stone manipulation. No interval fevers or leukocytosis by serum labs. She has been on CX-specific ABX according to most recent cultures.   Past Medical History  Diagnosis Date  . Hypertension   . Hypercholesteremia   . Parkinson disease   . Breast cancer     right  . Anxiety   . Sepsis     hx of   . Pyelonephritis   . Keratosis seborrheica   . [facility] . Dementia     per note of 02/04/15   . Heart murmur     undiagnosed per note 02/04/15   . Erythema   . Skin ulcer     healing on left second toe per office visit note of 02/04/2015     Past Surgical History  Procedure Laterality Date  . Cesarean section      x2  . Ovarian cyst removal      age 64. Appendectomy    . Colonoscopy    . Dental surgery      implant  . Breast lumpectomy with radioactive seed localization Right 08/28/2014   Procedure: RIGHT BREAST LUMPECTOMY WITH RADIOACTIVE SEED LOCALIZATION;  Surgeon: HaFanny SkatesMD;  [place] [facility];  Laterality: Right;    History reviewed. No pertinent family history. Social History:  reports that she has never smoked. She has never used smokeless tobacco. She reports that she drinks alcohol. She reports that she does not use illicit drugs.  Allergies: No Known Allergies  No prescriptions prior to admission    Results for orders placed or performed during the hospital encounter of 02/05/15 (from the past 48 hour(s))  Lipase, blood     Status: Abnormal   Collection Time: 02/05/15  6:25 PM  Result Value Ref Range   Lipase 73 (H) 22 - 51 U/L  Comprehensive metabolic panel     Status: Abnormal   Collection Time: 02/05/15  6:25 PM  Result Value Ref Range   Sodium 136 135 - 145 mmol/L   Potassium 4.2 3.5 - 5.1 mmol/L   Chloride 103 101 - 111 mmol/L   CO2 22 22 - 32 mmol/L   Glucose, Bld 102 (H) 65 - 99 mg/dL   BUN 18 6 - 20 mg/dL   Creatinine, Ser 1.02 (H) 0.44 - 1.00 mg/dL  Calcium 9.0 8.9 - 10.3 mg/dL   Total Protein 6.3 (L) 6.5 - 8.1 g/dL   Albumin 3.3 (L) 3.5 - 5.0 g/dL   AST 16 15 - 41 U/L   ALT 5 (L) 14 - 54 U/L   Alkaline Phosphatase 78 38 - 126 U/L   Total Bilirubin 0.8 0.3 - 1.2 mg/dL   GFR calc non Af Amer 57 (L) >60 mL/min   GFR calc Af Amer >60 >60 mL/min    Comment: (NOTE) The eGFR has been calculated using the CKD EPI equation. This calculation has not been validated in all clinical situations. eGFR's persistently <60 mL/min signify possible Chronic Kidney Disease.    Anion gap 11 5 - 15  CBC     Status: Abnormal   Collection Time: 02/05/15  6:25 PM  Result Value Ref Range   WBC 13.2 (H) 4.0 - 10.5 K/uL   RBC 4.10 3.87 - 5.11 MIL/uL   Hemoglobin 12.1 12.0 - 15.0 g/dL   HCT 36.7 36.0 - 46.0 %   MCV 89.5 78.0 - 100.0 fL   MCH 29.5 26.0 - 34.0 pg   MCHC 33.0 30.0 - 36.0 g/dL   RDW 13.6 11.5 - 15.5 %    Platelets 265 150 - 400 K/uL  Clostridium Difficile by PCR (not at [facility])     Status: Abnormal   Collection Time: 02/05/15  8:26 PM  Result Value Ref Range   C difficile by pcr POSITIVE (A) NEGATIVE    Comment: CRITICAL RESULT CALLED TO, READ BACK BY AND VERIFIED WITH: S GAMMONS,RN AT 1607 02/06/15 BY S YARBROUGH   I-Stat CG4 Lactic Acid, ED     Status: None   Collection Time: 02/05/15  9:14 PM  Result Value Ref Range   Lactic Acid, Venous 0.67 0.5 - 2.0 mmol/L  Urinalysis, Routine w reflex microscopic (not at [facility])     Status: Abnormal   Collection Time: 02/05/15 10:45 PM  Result Value Ref Range   Color, Urine YELLOW YELLOW   APPearance CLOUDY (A) CLEAR   Specific Gravity, Urine 1.004 (L) 1.005 - 1.030   pH 5.0 5.0 - 8.0   Glucose, UA NEGATIVE NEGATIVE mg/dL   Hgb urine dipstick NEGATIVE NEGATIVE   Bilirubin Urine NEGATIVE NEGATIVE   Ketones, ur NEGATIVE NEGATIVE mg/dL   Protein, ur NEGATIVE NEGATIVE mg/dL   Urobilinogen, UA 0.2 0.0 - 1.0 mg/dL   Nitrite NEGATIVE NEGATIVE   Leukocytes, UA TRACE (A) NEGATIVE  Urine microscopic-add on     Status: Abnormal   Collection Time: 02/05/15 10:45 PM  Result Value Ref Range   WBC, UA 3-6 <3 WBC/hpf   RBC / HPF 0-2 <3 RBC/hpf   Bacteria, UA RARE RARE   Casts HYALINE CASTS (A) NEGATIVE   Ct Abdomen Pelvis W Contrast  02/06/2015   CLINICAL DATA:  Prior sepsis due to obstructive uropathy. Now with diarrhea, possibly related antibiotics.  EXAM: CT ABDOMEN AND PELVIS WITH CONTRAST  TECHNIQUE: Multidetector CT imaging of the abdomen and pelvis was performed using the standard protocol following bolus administration of intravenous contrast.  CONTRAST:  187m OMNIPAQUE IOHEXOL 300 MG/ML  SOLN  COMPARISON:  01/09/2015  FINDINGS: There is abnormal mural thickening of the rectum, sigmoid and distal descending colon. This may represent colitis. The right hemicolon, transverse colon and sigmoid flexure are normal. There is no obstruction. There is no  extraluminal air. There is no ascites. Stomach and small bowel are normal.  There is a left percutaneous nephrostomy with  coiled end in the left renal pelvis. There is a 6 x 7 mm proximal left ureteral calculus just beyond the ureteropelvic junction. No other urinary calculi are evident, but there is mild limitation due the presence of intravenous contrast. There are unremarkable appearances of the liver, spleen, pancreas and adrenals. Abdominal aorta is normal in caliber with mild atherosclerotic calcification. Uterus and ovaries appear unremarkable. There is no significant abnormality in the lower chest. No significant musculoskeletal abnormality is evident.  IMPRESSION: 1. Abnormal mural thickening of the rectum, sigmoid and descending colon. This may represent colitis. No bowel obstruction or perforation. 2. 6 x 7 mm proximal left ureteral calculus. Left percutaneous nephrostomy.   Electronically Signed   By: Andreas Newport M.D.   On: 02/06/2015 01:31    Review of Systems  Constitutional: Negative.  Negative for fever and chills.  HENT: Negative.   Eyes: Negative.   Respiratory: Negative.   Cardiovascular: Negative.   Gastrointestinal: Negative.   Genitourinary: Negative.   Musculoskeletal: Negative.   Skin: Negative.   Neurological: Negative.   Endo/Heme/Allergies: Negative.   Psychiatric/Behavioral: Negative.     There were no vitals taken for this visit. Physical Exam  Constitutional: She appears well-developed.  HENT:  Head: Normocephalic.  Eyes: Pupils are equal, round, and reactive to light.  Neck: Normal range of motion.  Cardiovascular: Normal rate.   Respiratory: Effort normal.  GI: Soft.  Genitourinary:  Left nephrostomy c/d/i with clear urine  Musculoskeletal:  Improving fingertip and toe tip ischemic changes. No drianage.   Neurological: She is alert.  Psychiatric: She has a normal mood and affect.     Assessment/Plan         1- Left Ureteral Stone - now s/p  decompression with neph tube. Rec left ureteroscopy / laser in elective setting next avail. Will likely place JJ stent and remove neph tube at surgery to hopefully make more comfortable as long as anatomy favorable.  We rediscussed ureteroscopic stone manipulation with basketing and laser-lithotripsy in detail.  We rediscussed risks including bleeding, infection, damage to kidney / ureter  bladder, rarely loss of kidney. We rediscussed anesthetic risks and rare but serious surgical complications including DVT, PE, MI, and mortality. We specifically readdressed that in 5-10% of cases a staged approach is required with stenting followed by re-attempt ureteroscopy if anatomy unfavorable.   The patient voiced understanding and wises to proceed today as planned.    2 - Urosepsis - resolved clinically. Most recent UCX with scant non-clonal growth and has been on CX-specific Keflex prior to procedure today.   3 -  Acute Renal Failure - likely multifactorial with left renal obstruction but also recent profound hypoperfusion state, now resolved by serum labs  [facility], [place] 02/07/2015, 6:35 AM

## 2015-02-07 NOTE — Brief Op Note (Signed)
02/07/2015  4:17 PM  PATIENT:  Evans Lance  64 y.o. female  PRE-OPERATIVE DIAGNOSIS:  LEFT URETERAL AND RENAL STONES  POST-OPERATIVE DIAGNOSIS:  LEFT URETERAL AND RENAL STONES  PROCEDURE:  Procedure(s): CYSTOSCOPY WITH LEFT RETROGRADE PYELOGRAM, URETEROSCOPY AND STENT PLACEMENT. REMOVAL OF UROSTOMY TUBE (Left)  SURGEON:  Surgeon(s) and Role:    * [name], MD - Primary  PHYSICIAN ASSISTANT:   ASSISTANTS: none   ANESTHESIA:   general  EBL:  Total I/O In: 800 [I.V.:800] Out: -   BLOOD ADMINISTERED:none  DRAINS: none   LOCAL MEDICATIONS USED:  NONE  SPECIMEN:  No Specimen  DISPOSITION OF SPECIMEN:  N/A  COUNTS:  YES  TOURNIQUET:  * No tourniquets in log *  DICTATION: .Other Dictation: Dictation Number [id number]  PLAN OF CARE: Discharge to home after PACU  PATIENT DISPOSITION:  PACU - hemodynamically stable.   Delay start of Pharmacological VTE agent (>24hrs) due to surgical blood loss or risk of bleeding: not applicable

## 2015-02-07 NOTE — Anesthesia Postprocedure Evaluation (Signed)
  Anesthesia Post-op Note  Patient: Becky Rogers  Procedure(s) Performed: Procedure(s) (LRB): CYSTOSCOPY WITH LEFT RETROGRADE PYELOGRAM, URETEROSCOPY AND STENT PLACEMENT. REMOVAL OF UROSTOMY TUBE (Left)  Patient Location: PACU  Anesthesia Type: General  Level of Consciousness: awake and alert   Airway and Oxygen Therapy: Patient Spontanous Breathing  Post-op Pain: mild  Post-op Assessment: Post-op Vital signs reviewed, Patient's Cardiovascular Status Stable, Respiratory Function Stable, Patent Airway and No signs of Nausea or vomiting  Last Vitals:  Filed Vitals:   02/07/15 1742  BP: 146/66  Pulse: 68  Temp: 36.3 C  Resp: 16    Post-op Vital Signs: stable   Complications: No apparent anesthesia complications

## 2015-02-07 NOTE — Transfer of Care (Signed)
Immediate Anesthesia Transfer of Care Note  Patient: ARALY KAAS  Procedure(s) Performed: Procedure(s): CYSTOSCOPY WITH LEFT RETROGRADE PYELOGRAM, URETEROSCOPY AND STENT PLACEMENT. REMOVAL OF UROSTOMY TUBE (Left)  Patient Location: PACU  Anesthesia Type:General  Level of Consciousness:  sedated, patient cooperative and responds to stimulation  Airway & Oxygen Therapy:Patient Spontanous Breathing and Patient connected to face mask oxgen  Post-op Assessment:  Report given to PACU RN and Post -op Vital signs reviewed and stable  Post vital signs:  Reviewed and stable  Last Vitals:  Filed Vitals:   02/07/15 1312  BP: 125/60  Pulse: 76  Temp: 36.6 C  Resp: 16    Complications: No apparent anesthesia complications

## 2015-02-07 NOTE — Anesthesia Preprocedure Evaluation (Signed)
Anesthesia Evaluation  Patient identified by MRN, date of birth, ID band Patient awake    Reviewed: Allergy & Precautions, NPO status , Patient's Chart, lab work & pertinent test results  History of Anesthesia Complications Negative for: history of anesthetic complications  Airway Mallampati: II  TM Distance: >3 FB Neck ROM: Full    Dental no notable dental hx. (+) Dental Advisory Given   Pulmonary neg pulmonary ROS,  breath sounds clear to auscultation  Pulmonary exam normal       Cardiovascular hypertension, Pt. on medications Normal cardiovascular exam+ Valvular Problems/Murmurs Rhythm:Regular Rate:Normal     Neuro/Psych PSYCHIATRIC DISORDERS Anxiety Parkinsons    GI/Hepatic negative GI ROS, Neg liver ROS,   Endo/Other  negative endocrine ROS  Renal/GU Renal disease  negative genitourinary   Musculoskeletal negative musculoskeletal ROS (+)   Abdominal   Peds negative pediatric ROS (+)  Hematology negative hematology ROS (+)   Anesthesia Other Findings   Reproductive/Obstetrics negative OB ROS                             Anesthesia Physical Anesthesia Plan  ASA: II  Anesthesia Plan: General   Post-op Pain Management:    Induction: Intravenous  Airway Management Planned: Oral ETT  Additional Equipment:   Intra-op Plan:   Post-operative Plan: Extubation in OR  Informed Consent: I have reviewed the patients History and Physical, chart, labs and discussed the procedure including the risks, benefits and alternatives for the proposed anesthesia with the patient or authorized representative who has indicated his/her understanding and acceptance.   Dental advisory given  Plan Discussed with: CRNA  Anesthesia Plan Comments:         Anesthesia Quick Evaluation

## 2015-02-07 NOTE — Anesthesia Procedure Notes (Signed)
Procedure Name: Intubation Date/Time: 02/07/2015 3:47 PM Performed by: Montel Clock Pre-anesthesia Checklist: Patient identified, Emergency Drugs available, Suction available, Patient being monitored and Timeout performed Patient Re-evaluated:Patient Re-evaluated prior to inductionOxygen Delivery Method: Circle system utilized Preoxygenation: Pre-oxygenation with 100% oxygen Intubation Type: IV induction Ventilation: Mask ventilation without difficulty Laryngoscope Size: Mac and 3 Grade View: Grade I Tube type: Oral Tube size: 7.0 mm Number of attempts: 1 Airway Equipment and Method: Stylet Placement Confirmation: ETT inserted through vocal cords under direct vision,  positive ETCO2 and breath sounds checked- equal and bilateral Secured at: 20 cm Tube secured with: Tape Dental Injury: Teeth and Oropharynx as per pre-operative assessment

## 2015-02-07 NOTE — Progress Notes (Signed)
Dr. Tresa Moore notified of positive  Cdiff result on 02/05/15. Stacey Drain

## 2015-02-07 NOTE — Discharge Instructions (Signed)
1 - You may have urinary urgency (bladder spasms) and bloody urine on / off with stent in place. This is normal.  2 - Call MD or go to ER for fever >102, severe pain / nausea / vomiting not relieved by medications, or acute change in medical status  Cystoscopy, Care After Refer to this sheet in the next few weeks. These instructions provide you with information on caring for yourself after your procedure. Your caregiver may also give you more specific instructions. Your treatment has been planned according to current medical practices, but problems sometimes occur. Call your caregiver if you have any problems or questions after your procedure. HOME CARE INSTRUCTIONS  Things you can do to ease any discomfort after your procedure include:  Drinking enough water and fluids to keep your urine clear or pale yellow.  Taking a warm bath to relieve any burning feelings. SEEK IMMEDIATE MEDICAL CARE IF:   You have an increase in blood in your urine.  You notice blood clots in your urine.  You have difficulty passing urine.  You have the chills.  You have abdominal pain.  You have a fever or persistent symptoms for more than 2-3 days.  You have a fever and your symptoms suddenly get worse. MAKE SURE YOU:   Understand these instructions.  Will watch your condition.  Will get help right away if you are not doing well or get worse. Document Released: 01/15/2005 Document Revised: 02/28/2013 Document Reviewed: 12/20/2011 [facility] Patient Information 2015 [place], [place]. This information is not intended to replace advice given to you by your health care provider. Make sure you discuss any questions you have with your health care provider.   General Anesthesia, Care After Refer to this sheet in the next few weeks. These instructions provide you with information on caring for yourself after your procedure. Your health care provider may also give you more specific instructions. Your treatment has  been planned according to current medical practices, but problems sometimes occur. Call your health care provider if you have any problems or questions after your procedure. WHAT TO EXPECT AFTER THE PROCEDURE After the procedure, it is typical to experience:  Sleepiness.  Nausea and vomiting. HOME CARE INSTRUCTIONS  For the first 24 hours after general anesthesia:  Have a responsible person with you.  Do not drive a car. If you are alone, do not take public transportation.  Do not drink alcohol.  Do not take medicine that has not been prescribed by your health care provider.  Do not sign important papers or make important decisions.  You may resume a normal diet and activities as directed by your health care provider.  Change bandages (dressings) as directed.  If you have questions or problems that seem related to general anesthesia, call the hospital and ask for the anesthetist or anesthesiologist on call. SEEK MEDICAL CARE IF:  You have nausea and vomiting that continue the day after anesthesia.  You develop a rash. SEEK IMMEDIATE MEDICAL CARE IF:   You have difficulty breathing.  You have chest pain.  You have any allergic problems. Document Released: 10/04/2000 Document Revised: 07/03/2013 Document Reviewed: 01/11/2013 [facility] Patient Information 2015 [place], [place]. This information is not intended to replace advice given to you by your health care provider. Make sure you discuss any questions you have with your health care provider.     Clostridium Difficile Infection Clostridium difficile (C. difficile) is a germ found in the intestines. C. difficile infection can occur after taking  some medicines. C. difficile infection can cause watery poop (diarrhea) or severe disease. HOME CARE  Drink enough fluids to keep your pee (urine) clear or pale yellow. Avoid milk, caffeine, and alcohol.  Ask your doctor how to replace body fluid losses (rehydrate).  Eat small  meals more often rather than large meals.  Take your medicine (antibiotics) as told. Finish it even if you start to feel better.  Do not  use medicines to slow the watery poop.  Wash your hands well after using the bathroom and before preparing food.  Make sure people who live with you wash their hands often.  Clean all surfaces. Use a product that contains chlorine bleach. GET HELP RIGHT AWAY IF:   The watery poop does not stop, or it comes back after you finish your medicine.  You feel very dry or thirsty (dehydrated).  You have a fever.  You have more belly (abdominal) pain or tenderness.  There is blood in your poop (stool), or your poop is black and tar-like.  You cannot eat food or drink liquids without throwing up (vomiting). MAKE SURE YOU:  Understand these instructions.  Will watch your condition.  Will get help right away if you are not doing well or get worse. Document Released: 04/25/2009 Document Revised: 11/12/2013 Document Reviewed: 12/04/2010 [facility] Patient Information 2015 [place], [place]. This information is not intended to replace advice given to you by your health care provider. Make sure you discuss any questions you have with your health care provider.

## 2015-02-08 ENCOUNTER — Telehealth: Payer: Self-pay | Admitting: Emergency Medicine

## 2015-02-08 NOTE — Telephone Encounter (Signed)
Post ED Visit - Positive Culture Follow-up: Successful Patient Follow-Up   Positive C-diff  [x]  Patient discharged without antimicrobial prescription and treatment is now indicated []  Organism is resistant to prescribed ED discharge antimicrobial []  Patient with positive blood cultures  Changes discussed with ED provider: Blanchie Dessert, MD New antibiotic prescription: Flagyl 500 mg PO BID x 10 days Called to [facility] [phone]  Contacted patient, date 02/08/15, time 1500 ID verified, patient notified of positive c-diff and need for treatment. Infection control instructions provided. RX Flagyl called to [facility].    Ernesta Amble 02/08/2015, 3:11 PM

## 2015-02-08 NOTE — Op Note (Signed)
Becky Rogers NO.:  [id number]  MEDICAL RECORD NO.:  [id number]  LOCATION:  [facility]                         FACILITY:  [facility]  PHYSICIAN:  [name], MD     DATE OF BIRTH:  03/02/1951  DATE OF PROCEDURE:  02/07/2015                              OPERATIVE REPORT  DIAGNOSES:  Left ureteral stone, history of urosepsis.  PROCEDURE: 1. Cystoscopy. 2. Left retrograde pyelogram with interpretation. 3. Left diagnostic ureteroscopy. 4. . Removal of left nephrostomy tube. 5. Placement of left ureteral stent 6 x 24 Contour, no tether.  ESTIMATED BLOOD LOSS:  Nil.  COMPLICATIONS:  None.  SPECIMEN:  None.  FINDINGS: 1. Significant relative proximal narrowing of the left proximal ureter     at the area of the UPJ, this did not allow advancement of     ureteroscope to the level of the stone. 2. Unremarkable left antegrade nephrostogram. 3. Excellent placement of left ureteral stent, proximal in the renal     pelvis and distally in the urinary bladder after nephrostomy tube     removal.  INDICATION:  Becky Rogers is an unfortunate 64 year old lady with history of Parkinson, had episode of urosepsis several months ago with left ureteral stone and E. coli bacteremia.  She was acutely ill and has been temporized with left nephrostomy tube for renal drainage and now cleared her infectious parameters and is back home, nearly back to baseline.  She was seen in followup in the office.  Options were discussed for definitive stone management including left ureteroscopic stone manipulation versus lithotripsy and given the multifocality of the stone including ureteral and renal component, she wished to proceed with ureteroscopy.  Informed consent was obtained and placed in medical record.  PROCEDURE IN DETAIL:  The patient being Becky Rogers, procedure being  ureteroscopic stone manipulation was confirmed.  Procedure was carried out.  Time-out was performed.   Intravenous antibiotics were administered.  General endotracheal anesthesia was induced.  The patient was placed into a low lithotomy position.  Sterile field was created by prepping and draping the patient's vagina, introitus, and proximal thighs using iodine x3.  Next, cystourethroscopy performed using a 23- French rigid cystoscope 30-degree offset lens.  Inspection of bladder revealed no diverticula, calcifications, papular lesions.  The left ureteral orifice was cannulated with a 6-French end-hole catheter and left retrograde pyelogram was obtained.  Left retrograde pyelogram demonstrated a single left ureter, single system left kidney.  There was some relative narrowing in the area of the proximal ureter and filling defect consistent with known stone.  A nephrostomy tube was seen in situ.  This was capped prior to procedure. It was coiled in the renal pelvis.  A 0.038 ZIPwire was advanced into lower pole and set aside as a safety wire.  Next, semi-rigid ureteroscopy was performed in the distal fourth-fifth of the left ureter alongside as a separate Sensor working wire.  No mucosal abnormalities were found.  The semi-rigid ureteroscope was then exchanged for a 12/14, 24 cm ureteral access sheath using continuous fluoroscopic guidance to the level of the proximal ureter.  Next, flexible digital ureteroscopy was performed using a single channel ureteroscope which allowed  inspection of the proximal ureter.  At the area of UPJ, there was relative ureteral narrowing, just it did not allow safe passage of the ureteroscope to the area of the stone or kidney.  A dual-wire technique was used.  Again using a working wire and train- tracking between Wires and  with this technique and the smallest caliber flexible ureteroscope, the proximal ureter UPJ area was not able to be successfully traversed.  Given this constellation of findings it was felt the safest means of management would be to place  ureteral stent allowed for passive dilation and re-attempt retrograde ureteroscopy in several weeks.  The patient also has nephrostomy tube in situ and with the stent in place this was felt to be redunctant and in effort to increase her quality of life decision was made to remove the nephrostomy tube with safety wire in place.  The nephrostomy tube was removed under continuous fluoroscopic vision, set aside for discard. Finally, a new 6 x 24 Contour-type stent was placed using fluoroscopic guidance over the remaining safety wire.  Good proximal and distal deployments were noted, proximally in the renal pelvis and distally in the urinary bladder.  Procedure was then terminated.  The patient tolerated the procedure well.  There were no immediate periprocedural complications.  The patient was taken to the postanesthesia care unit in stable condition.          ______________________________ [name], MD     TM/MEDQ  D:  02/07/2015  T:  02/08/2015  Job:  [id number]

## 2015-02-09 LAB — STOOL CULTURE

## 2015-02-10 ENCOUNTER — Encounter (HOSPITAL_COMMUNITY): Payer: Self-pay | Admitting: Urology

## 2015-02-12 ENCOUNTER — Other Ambulatory Visit: Payer: Self-pay | Admitting: Urology

## 2015-02-20 ENCOUNTER — Encounter (HOSPITAL_BASED_OUTPATIENT_CLINIC_OR_DEPARTMENT_OTHER): Payer: Self-pay | Admitting: *Deleted

## 2015-02-21 ENCOUNTER — Encounter (HOSPITAL_BASED_OUTPATIENT_CLINIC_OR_DEPARTMENT_OTHER): Payer: Self-pay | Admitting: *Deleted

## 2015-02-26 ENCOUNTER — Encounter (HOSPITAL_BASED_OUTPATIENT_CLINIC_OR_DEPARTMENT_OTHER): Payer: Self-pay | Admitting: *Deleted

## 2015-02-26 NOTE — Progress Notes (Signed)
SPOKE W/ HUSBAND, PT HAS MILD DEMENTIA/ PARKINSON'S.  NPO AFTER MN WITH EXCEPTION CLEAR LIQUIDS UNTIL 0900 (NO CREAM/MILK PRODUCTS).  ARRIVE AT 1330.  NEEDS ISTAT.  CURRENT EKG IN CHART AND EPIC.  WILL TAKE AM MEDS W/ SIPS OF WATER DOS.  WILL NEED HUSBAND IN PRE-OP.

## 2015-02-28 ENCOUNTER — Ambulatory Visit (HOSPITAL_BASED_OUTPATIENT_CLINIC_OR_DEPARTMENT_OTHER): Payer: BLUE CROSS/BLUE SHIELD | Admitting: Anesthesiology

## 2015-02-28 ENCOUNTER — Ambulatory Visit (HOSPITAL_BASED_OUTPATIENT_CLINIC_OR_DEPARTMENT_OTHER)
Admission: RE | Admit: 2015-02-28 | Discharge: 2015-02-28 | Disposition: A | Discharge status: Home / Self Care | Payer: BLUE CROSS/BLUE SHIELD | Source: Ambulatory Visit | Attending: Urology | Admitting: Urology

## 2015-02-28 ENCOUNTER — Encounter (HOSPITAL_BASED_OUTPATIENT_CLINIC_OR_DEPARTMENT_OTHER)
Admission: RE | Disposition: A | Discharge status: Home / Self Care | Payer: Self-pay | Source: Ambulatory Visit | Attending: Urology

## 2015-02-28 ENCOUNTER — Encounter (HOSPITAL_BASED_OUTPATIENT_CLINIC_OR_DEPARTMENT_OTHER): Payer: Self-pay | Admitting: Anesthesiology

## 2015-02-28 DIAGNOSIS — L821 Other seborrheic keratosis: Secondary | ICD-10-CM | POA: Diagnosis not present

## 2015-02-28 DIAGNOSIS — F419 Anxiety disorder, unspecified: Secondary | ICD-10-CM | POA: Diagnosis not present

## 2015-02-28 DIAGNOSIS — I10 Essential (primary) hypertension: Secondary | ICD-10-CM | POA: Insufficient documentation

## 2015-02-28 DIAGNOSIS — F0390 Unspecified dementia without behavioral disturbance: Secondary | ICD-10-CM | POA: Diagnosis not present

## 2015-02-28 DIAGNOSIS — N179 Acute kidney failure, unspecified: Secondary | ICD-10-CM | POA: Insufficient documentation

## 2015-02-28 DIAGNOSIS — G20 Parkinson's disease: Secondary | ICD-10-CM | POA: Diagnosis not present

## 2015-02-28 DIAGNOSIS — E785 Hyperlipidemia, unspecified: Secondary | ICD-10-CM | POA: Diagnosis not present

## 2015-02-28 DIAGNOSIS — Z87442 Personal history of urinary calculi: Secondary | ICD-10-CM | POA: Insufficient documentation

## 2015-02-28 DIAGNOSIS — Z79899 Other long term (current) drug therapy: Secondary | ICD-10-CM | POA: Insufficient documentation

## 2015-02-28 DIAGNOSIS — N200 Calculus of kidney: Secondary | ICD-10-CM | POA: Insufficient documentation

## 2015-02-28 HISTORY — DX: Non-pressure chronic ulcer of other part of left foot with unspecified severity: L97.529

## 2015-02-28 HISTORY — DX: Other disorder of circulatory system: I99.8

## 2015-02-28 HISTORY — DX: Calculus of ureter: N20.1

## 2015-02-28 HISTORY — PX: HOLMIUM LASER APPLICATION: SHX5852

## 2015-02-28 HISTORY — DX: Unspecified dementia without behavioral disturbance: F03.90

## 2015-02-28 HISTORY — DX: Personal history of other infectious and parasitic diseases: Z86.19

## 2015-02-28 HISTORY — PX: CYSTOSCOPY W/ URETERAL STENT PLACEMENT: SHX1429

## 2015-02-28 HISTORY — DX: Reserved for inherently not codable concepts without codable children: IMO0001

## 2015-02-28 HISTORY — PX: CYSTOSCOPY/RETROGRADE/URETEROSCOPY/STONE EXTRACTION WITH BASKET: SHX5317

## 2015-02-28 HISTORY — DX: Hyperlipidemia, unspecified: E78.5

## 2015-02-28 HISTORY — DX: Non-pressure chronic ulcer of other part of right foot with unspecified severity: L97.519

## 2015-02-28 HISTORY — DX: Unspecified dementia, mild, without behavioral disturbance, psychotic disturbance, mood disturbance, and anxiety: F03.A0

## 2015-02-28 SURGERY — HOLMIUM LASER APPLICATION
Anesthesia: General | Site: Ureter | Laterality: Left

## 2015-02-28 MED ORDER — FENTANYL CITRATE (PF) 100 MCG/2ML IJ SOLN
INTRAMUSCULAR | Status: DC | PRN
  Administered 2015-02-28: 25 ug via INTRAVENOUS

## 2015-02-28 MED ORDER — KETOROLAC TROMETHAMINE 30 MG/ML IJ SOLN
INTRAMUSCULAR | Status: DC | PRN
Start: 2015-02-28 — End: 2015-02-28
  Administered 2015-02-28: 30 mg via INTRAVENOUS

## 2015-02-28 MED ORDER — ONDANSETRON HCL 4 MG/2ML IJ SOLN
4.0000 mg | Freq: Once | INTRAMUSCULAR | Status: DC | PRN
  Filled 2015-02-28: qty 2

## 2015-02-28 MED ORDER — FENTANYL CITRATE (PF) 100 MCG/2ML IJ SOLN
25.0000 ug | INTRAMUSCULAR | Status: DC | PRN
  Filled 2015-02-28: qty 1

## 2015-02-28 MED ORDER — LACTATED RINGERS IV SOLN
INTRAVENOUS | Status: DC
  Administered 2015-02-28: 14:00:00 via INTRAVENOUS
  Filled 2015-02-28: qty 1000

## 2015-02-28 MED ORDER — 0.9 % SODIUM CHLORIDE (POUR BTL) OPTIME
TOPICAL | Status: DC | PRN
  Administered 2015-02-28: 1000 mL

## 2015-02-28 MED ORDER — LIDOCAINE HCL (CARDIAC) 20 MG/ML IV SOLN
INTRAVENOUS | Status: DC | PRN
  Administered 2015-02-28: 40 mg via INTRAVENOUS

## 2015-02-28 MED ORDER — ONDANSETRON HCL 4 MG/2ML IJ SOLN
INTRAMUSCULAR | Status: DC | PRN
  Administered 2015-02-28: 4 mg via INTRAVENOUS

## 2015-02-28 MED ORDER — MIDAZOLAM HCL 2 MG/2ML IJ SOLN
INTRAMUSCULAR | Status: AC
  Filled 2015-02-28: qty 2

## 2015-02-28 MED ORDER — IOHEXOL 300 MG/ML  SOLN: INTRAMUSCULAR | Status: DC | PRN

## 2015-02-28 MED ORDER — FENTANYL CITRATE (PF) 100 MCG/2ML IJ SOLN
INTRAMUSCULAR | Status: AC
  Filled 2015-02-28: qty 4

## 2015-02-28 MED ORDER — GENTAMICIN IN SALINE 1.6-0.9 MG/ML-% IV SOLN
80.0000 mg | INTRAVENOUS | Status: AC
  Administered 2015-02-28: 80 mg via INTRAVENOUS
  Filled 2015-02-28 (×2): qty 50

## 2015-02-28 MED ORDER — ACETAMINOPHEN 10 MG/ML IV SOLN
INTRAVENOUS | Status: DC | PRN
  Administered 2015-02-28: 1000 mg via INTRAVENOUS

## 2015-02-28 MED ORDER — DEXAMETHASONE SODIUM PHOSPHATE 4 MG/ML IJ SOLN
INTRAMUSCULAR | Status: DC | PRN
  Administered 2015-02-28: 4 mg via INTRAVENOUS

## 2015-02-28 MED ORDER — IOHEXOL 350 MG/ML SOLN
INTRAVENOUS | Status: DC | PRN
  Administered 2015-02-28: 12 mL via URETHRAL

## 2015-02-28 MED ORDER — PROPOFOL 10 MG/ML IV BOLUS
INTRAVENOUS | Status: DC | PRN
  Administered 2015-02-28: 80 mg via INTRAVENOUS

## 2015-02-28 MED ORDER — SODIUM CHLORIDE 0.9 % IR SOLN
Status: DC | PRN
  Administered 2015-02-28: 4000 mL

## 2015-02-28 MED ORDER — SULFAMETHOXAZOLE-TRIMETHOPRIM 800-160 MG PO TABS: 1.0000 | ORAL_TABLET | Freq: Two times a day (BID) | ORAL | Status: DC

## 2015-02-28 SURGICAL SUPPLY — 39 items
BAG DRAIN URO-CYSTO SKYTR STRL (DRAIN) ×4 IMPLANT
BASKET LASER NITINOL 1.9FR (BASKET) ×4 IMPLANT
BASKET STNLS GEMINI 4WIRE 3FR (BASKET) IMPLANT
BASKET STONE 1.7 NGAGE (UROLOGICAL SUPPLIES) IMPLANT
BASKET ZERO TIP NITINOL 2.4FR (BASKET) IMPLANT
CANISTER SUCT LVC 12 LTR MEDI- (MISCELLANEOUS) IMPLANT
CATH INTERMIT  6FR 70CM (CATHETERS) ×4 IMPLANT
CATH URET 5FR 28IN CONE TIP (BALLOONS)
CATH URET 5FR 28IN OPEN ENDED (CATHETERS) IMPLANT
CATH URET 5FR 70CM CONE TIP (BALLOONS) IMPLANT
CLOTH BEACON ORANGE TIMEOUT ST (SAFETY) ×4 IMPLANT
ELECT REM PT RETURN 9FT ADLT (ELECTROSURGICAL)
ELECTRODE REM PT RTRN 9FT ADLT (ELECTROSURGICAL) IMPLANT
FIBER LASER FLEXIVA 200 (UROLOGICAL SUPPLIES) ×4 IMPLANT
FIBER LASER FLEXIVA 365 (UROLOGICAL SUPPLIES) IMPLANT
FIBER LASER TRAC TIP (UROLOGICAL SUPPLIES) IMPLANT
GLOVE BIO SURGEON STRL SZ7.5 (GLOVE) ×8 IMPLANT
GOWN STRL REUS W/ TWL LRG LVL3 (GOWN DISPOSABLE) IMPLANT
GOWN STRL REUS W/ TWL XL LVL3 (GOWN DISPOSABLE) ×2 IMPLANT
GOWN STRL REUS W/TWL LRG LVL3 (GOWN DISPOSABLE) ×4 IMPLANT
GOWN STRL REUS W/TWL XL LVL3 (GOWN DISPOSABLE) ×2
GUIDEWIRE 0.038 PTFE COATED (WIRE) IMPLANT
GUIDEWIRE ANG ZIPWIRE 038X150 (WIRE) ×4 IMPLANT
GUIDEWIRE STR DUAL SENSOR (WIRE) ×4 IMPLANT
IV NS 1000ML (IV SOLUTION) ×2
IV NS 1000ML BAXH (IV SOLUTION) ×2 IMPLANT
IV NS IRRIG 3000ML ARTHROMATIC (IV SOLUTION) ×4 IMPLANT
KIT BALLIN UROMAX 15FX10 (LABEL) IMPLANT
KIT BALLN UROMAX 15FX4 (MISCELLANEOUS) IMPLANT
KIT BALLN UROMAX 26 75X4 (MISCELLANEOUS)
MANIFOLD NEPTUNE II (INSTRUMENTS) IMPLANT
NS IRRIG 500ML POUR BTL (IV SOLUTION) ×4 IMPLANT
PACK CYSTO (CUSTOM PROCEDURE TRAY) ×4 IMPLANT
SET HIGH PRES BAL DIL (LABEL)
SHEATH ACCESS URETERAL 24CM (SHEATH) ×4 IMPLANT
STENT POLARIS 5FRX24 (STENTS) ×4 IMPLANT
SYRINGE 10CC LL (SYRINGE) ×4 IMPLANT
SYRINGE IRR TOOMEY STRL 70CC (SYRINGE) IMPLANT
TUBE FEEDING 8FR 16IN STR KANG (MISCELLANEOUS) ×4 IMPLANT

## 2015-02-28 NOTE — H&P (Signed)
Becky Rogers is an 64 y.o. female.    Chief Complaint: Pre-Op Left Ureteroscopic Stone Manipulation  HPI:   1- Left Ureteral Stone - s/p left neph tube 01/10/15 for urgent decompression from 7.32mm left prox ureteral stone with mild-mod hydro and ipsilateral approx 65mm lower pole stone by ER CT 01/09/15 on eval left abdominal pain and septic shock. No prior nephrolithiasis. No contralateral stones.  2 - Urosepsis - fevers, tachycardia, hypotension on ER presentation c/w sepsis 01/2015. UCX, [place]  with pan-sensitive e. Coli, now  with resolution of fevers. Most recent CX from our office w/o clonal growth.    3 -  Acute Renal Failure - Cr 2.9 on ER presentation up from baseline <1. Admits to some poor PO intake and now recent hypotention, left hydro. No right hydro or obvious renal atrophy.  Cr peak high 3's. R/u [place] after neph tube placement with resolved left hydro and confirms no right sided. Cr 01/25/15 0.63, back at baseline with neph tube in place.   PMH sig for dementia (midl), cesarean x2. Her PCP is Becky Rogers with [facility].   Today Becky Rogers is seen to proceed with left ureteroscopic stone manipulation. No interval fevers. She has been on keflex proph to decrase likely mild tube colonization based on most recent CX's.   Past Medical History  Diagnosis Date  . Parkinson disease   . Anxiety   . Keratosis seborrheica   . Erythema   . 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 Ischemic finger     erythemic of fingers secondary to septic shock 12-17-2014--  HEALING  . History of septic shock   . Hyperlipidemia   . Mild dementia   . Left ureteral stone   . Hypertension     CURRENTLY NOT TAKING MED SECONDARY TO HYPOTENTION FROM SEPSIS--  MONITORED BY PCP  . History of Clostridium difficile     dx 02-07-2015--  resolved    Past Surgical History  Procedure Laterality Date  . Cesarean section  x2  . Ovarian cyst removal  age  47  . Appendectomy    . Colonoscopy    . Breast lumpectomy with radioactive seed localization Right 08/28/2014    Procedure: RIGHT BREAST LUMPECTOMY WITH RADIOACTIVE SEED LOCALIZATION;  Surgeon: [name], MD;  Location: [place];  Service: General;  Laterality: Right;  . Cystoscopy with retrograde pyelogram, ureteroscopy and stent placement Left 02/07/2015    Procedure: CYSTOSCOPY WITH LEFT RETROGRADE PYELOGRAM, URETEROSCOPY AND STENT PLACEMENT. REMOVAL OF UROSTOMY TUBE;  Surgeon: [name], MD;  Location: [facility];  Service: Urology;  Laterality: Left;  . Negative sleep study  2013    History reviewed. No pertinent family history. Social History:  reports that she has never smoked. She has never used smokeless tobacco. She reports that she drinks alcohol. She reports that she does not use illicit drugs.  Allergies: No Known Allergies  No prescriptions prior to admission    No results found for this or any previous visit (from the past 48 hour(s)). No results found.  Review of Systems  Constitutional: Negative.  Negative for fever and chills.  HENT: Negative.   Eyes: Negative.   Respiratory: Negative.   Cardiovascular: Negative.   Gastrointestinal: Negative.   Genitourinary: Negative.   Musculoskeletal: Negative.        Stable finger / toe neuropathy  Skin: Negative.   Neurological: Negative.  Endo/Heme/Allergies: Negative.   Psychiatric/Behavioral: Negative.     Height 5' 1.5" (1.562 m), weight 44.453 kg (98 lb). Physical Exam  Constitutional: She appears well-developed.  HENT:  Head: Normocephalic.  Eyes: Pupils are equal, round, and reactive to light.  Neck: Normal range of motion.  Cardiovascular: Normal rate.   Respiratory: Effort normal.  GI: Soft.  Genitourinary:  No CVAT. Left neph tube c/d/i with clear yellow urine  Musculoskeletal: Normal range of motion.  Slowly improving finger and toe tip ischemic changes. No necrosis.    Neurological: She is alert.  Stigmata of mild, stable dementia, still AOx3  Skin: Skin is warm.  Psychiatric: She has a normal mood and affect. Judgment and thought content normal.     Assessment/Plan  1- Left Ureteral Stone - now s/p decompression with neph tube. Rec left ureteroscopy / laser as planned todayl. Will likely place JJ stent and remove neph tube at surgery to hopefully make more comfortable as long as anatomy favorable.  We rediscussed ureteroscopic stone manipulation with basketing and laser-lithotripsy in detail.  We rediscussed risks including bleeding, infection, damage to kidney / ureter  bladder, rarely loss of kidney. We rediscussed anesthetic risks and rare but serious surgical complications including DVT, PE, MI, and mortality. We specifically readdressed that in 5-10% of cases a staged approach is required with stenting followed by re-attempt ureteroscopy if anatomy unfavorable.   The patient voiced understanding and wishes to proceed.    2 - Urosepsis - resolved clinically and most recent CX's w/o clonal growth.   3 -  Acute Renal Failure - likely multifactorial with left renal obstruction but also recent profound hypoperfusion state, now resolved by serum labs [facility] [place] 02/28/2015, 6:33 AM

## 2015-02-28 NOTE — Transfer of Care (Signed)
Immediate Anesthesia Transfer of Care Note  Patient: Becky Rogers  Procedure(s) Performed: Procedure(s): WITH HOLMIUM LASER  (Left) CYSTOSCOPY/RETROGRADE/URETEROSCOPY/STONE EXTRACTION WITH BASKET (Left) CYSTOSCOPY WITH STENT REPLACEMENT (Left)  Patient Location: PACU  Anesthesia Type:General  Level of Consciousness: sedated and responds to stimulation  Airway & Oxygen Therapy: Patient Spontanous Breathing and Patient connected to nasal cannula oxygen  Post-op Assessment: Report given to RN  Post vital signs: Reviewed and stable  Last Vitals:  Filed Vitals:   02/28/15 1332  BP: 122/55  Pulse: 79  Temp: 37.2 C  Resp: 14    Complications: No apparent anesthesia complications

## 2015-02-28 NOTE — Anesthesia Preprocedure Evaluation (Addendum)
Anesthesia Evaluation  Patient identified by MRN, date of birth, ID band Patient awake    Reviewed: Allergy & Precautions, NPO status , Patient's Chart, lab work & pertinent test results  History of Anesthesia Complications Negative for: history of anesthetic complications  Airway Mallampati: II  TM Distance: >3 FB Neck ROM: Full    Dental no notable dental hx. (+) Dental Advisory Given   Pulmonary neg pulmonary ROS,  breath sounds clear to auscultation  Pulmonary exam normal       Cardiovascular hypertension, Pt. on medications Normal cardiovascular exam+ Valvular Problems/Murmurs Rhythm:Regular Rate:Normal     Neuro/Psych PSYCHIATRIC DISORDERS Anxiety Pt and Husband state has significant cognitive changes since sepsis.  Can carry a converstation well but short term memory loss and inability to do simple math, or alphabetize. Parkinsons    GI/Hepatic negative GI ROS, Neg liver ROS,   Endo/Other  negative endocrine ROS  Renal/GU Renal disease   Nephrolithiasis s/p ureteral stent    Musculoskeletal negative musculoskeletal ROS (+)   Abdominal   Peds negative pediatric ROS (+)  Hematology negative hematology ROS (+)   Anesthesia Other Findings   Reproductive/Obstetrics negative OB ROS                          Anesthesia Physical  Anesthesia Plan  ASA: II  Anesthesia Plan: General   Post-op Pain Management:    Induction: Intravenous  Airway Management Planned: LMA  Additional Equipment:   Intra-op Plan:   Post-operative Plan: Extubation in OR  Informed Consent: I have reviewed the patients History and Physical, chart, labs and discussed the procedure including the risks, benefits and alternatives for the proposed anesthesia with the patient or authorized representative who has indicated his/her understanding and acceptance.   Dental advisory given  Plan Discussed with:  CRNA  Anesthesia Plan Comments:        Anesthesia Quick Evaluation

## 2015-02-28 NOTE — Anesthesia Procedure Notes (Signed)
Procedure Name: LMA Insertion Date/Time: 02/28/2015 3:05 PM Performed by: Bethena Roys T Pre-anesthesia Checklist: Patient identified, Emergency Drugs available, Suction available and Patient being monitored Patient Re-evaluated:Patient Re-evaluated prior to inductionOxygen Delivery Method: Circle System Utilized Preoxygenation: Pre-oxygenation with 100% oxygen Intubation Type: IV induction Ventilation: Mask ventilation without difficulty LMA: LMA inserted LMA Size: 4.0 and 3.0 Number of attempts: 1 Airway Equipment and Method: Bite block Placement Confirmation: positive ETCO2 Tube secured with: Tape Dental Injury: Teeth and Oropharynx as per pre-operative assessment

## 2015-02-28 NOTE — Brief Op Note (Signed)
02/28/2015  4:01 PM  PATIENT:  Becky Rogers  64 y.o. female  PRE-OPERATIVE DIAGNOSIS:  LEFT URETERAL STONE  POST-OPERATIVE DIAGNOSIS:  LEFT URETERAL STONE  PROCEDURE:  Procedure(s): WITH HOLMIUM LASER  (Left) CYSTOSCOPY/RETROGRADE/URETEROSCOPY/STONE EXTRACTION WITH BASKET (Left) CYSTOSCOPY WITH STENT REPLACEMENT (Left)  SURGEON:  Surgeon(s) and Role:    * [name], MD - Primary  PHYSICIAN ASSISTANT:   ASSISTANTS: none   ANESTHESIA:   general  EBL:  Total I/O In: 450 [I.V.:450] Out: -   BLOOD ADMINISTERED:none  DRAINS: none   LOCAL MEDICATIONS USED:  NONE  SPECIMEN:  Source of Specimen:  left renal stone fragments  DISPOSITION OF SPECIMEN:  [facility] for compositional analysis  COUNTS:  YES  TOURNIQUET:  * No tourniquets in log *  DICTATION: .Other Dictation: Dictation Number [id number]  PLAN OF CARE: Discharge to home after PACU  PATIENT DISPOSITION:  PACU - hemodynamically stable.   Delay start of Pharmacological VTE agent (>24hrs) due to surgical blood loss or risk of bleeding: yes

## 2015-02-28 NOTE — Anesthesia Postprocedure Evaluation (Signed)
  Anesthesia Post-op Note  Patient: Becky Rogers  Procedure(s) Performed: Procedure(s) (LRB): WITH HOLMIUM LASER  (Left) CYSTOSCOPY/RETROGRADE/URETEROSCOPY/STONE EXTRACTION WITH BASKET (Left) CYSTOSCOPY WITH STENT REPLACEMENT (Left)  Patient Location: PACU  Anesthesia Type: General  Level of Consciousness: awake and alert   Airway and Oxygen Therapy: Patient Spontanous Breathing  Post-op Pain: mild  Post-op Assessment: Post-op Vital signs reviewed, Patient's Cardiovascular Status Stable, Respiratory Function Stable, Patent Airway and No signs of Nausea or vomiting  Last Vitals:  Filed Vitals:   02/28/15 1615  BP: 118/83  Pulse: 52  Temp:   Resp: 14    Post-op Vital Signs: stable   Complications: No apparent anesthesia complications

## 2015-02-28 NOTE — Discharge Instructions (Signed)
1 - You may have urinary urgency (bladder spasms) and bloody urine on / off with stent in place. This is normal.  2 - Call MD or go to ER for fever >102, severe pain / nausea / vomiting not relieved by medications, or acute change in medical status  3 - Remove tethered stent on Monday morning at home by pulling on string, then blue-white plastic tubing, and discarding. Dr. Tresa Moore is in the office Monday if any issues arise.     [facility] [phone] Post Ureteroscopy With or Without Stent Instructions  Definitions:  Ureter: The duct that transports urine from the kidney to the bladder. Stent:   A plastic hollow tube that is placed into the ureter, from the kidney to the                 bladder to prevent the ureter from swelling shut.  GENERAL INSTRUCTIONS:  Despite the fact that no skin incisions were used, the area around the ureter and bladder is raw and irritated. The stent is a foreign body which will further irritate the bladder wall. This irritation is manifested by increased frequency of urination, both day and night, and by an increase in the urge to urinate. In some, the urge to urinate is present almost always. Sometimes the urge is strong enough that you may not be able to stop yourself from urinating. The only real cure is to remove the stent and then give time for the bladder wall to heal which can't be done until the danger of the ureter swelling shut has passed, which varies.  You may see some blood in your urine while the stent is in place and a few days afterwards. Do not be alarmed, even if the urine was clear for a while. Get off your feet and drink lots of fluids until clearing occurs. If you start to pass clots or don't improve, call us.  DIET: You may return to your normal diet immediately. Because of the raw surface of your bladder, alcohol, spicy foods, acid type foods and drinks with caffeine may cause irritation or frequency and should be used in  moderation. To keep your urine flowing freely and to avoid constipation, drink plenty of fluids during the day ( 8-10 glasses ). Tip: Avoid cranberry juice because it is very acidic.  ACTIVITY: Your physical activity doesn't need to be restricted. However, if you are very active, you may see some blood in your urine. We suggest that you reduce your activity under these circumstances until the bleeding has stopped.  BOWELS: It is important to keep your bowels regular during the postoperative period. Straining with bowel movements can cause bleeding. A bowel movement every other day is reasonable. Use a mild laxative if needed, such as Milk of Magnesia 2-3 tablespoons, or 2 Dulcolax tablets. Call if you continue to have problems. If you have been taking narcotics for pain, before, during or after your surgery, you may be constipated. Take a laxative if necessary.   MEDICATION: You should resume your pre-surgery medications unless told not to. In addition you will often be given an antibiotic to prevent infection. These should be taken as prescribed until the bottles are finished unless you are having an unusual reaction to one of the drugs.  PROBLEMS YOU SHOULD REPORT TO [place]:  Fevers over 100.5 Fahrenheit.  Heavy bleeding, or clots ( See above notes about blood in urine ).  Inability to urinate.  Drug reactions ( hives, rash,  nausea, vomiting, diarrhea ).  Severe burning or pain with urination that is not improving.  FOLLOW-UP: You will need a follow-up appointment to monitor your progress. Call for this appointment at the number listed above. Usually the first appointment will be about three to fourteen days after your surgery.     Post Anesthesia Home Care Instructions  Activity: Get plenty of rest for the remainder of the day. A responsible adult should stay with you for 24 hours following the procedure.  For the next 24 hours, DO NOT: -Drive a car -Paediatric nurse -Drink  alcoholic beverages -Take any medication unless instructed by your physician -Make any legal decisions or sign important papers.  Meals: Start with liquid foods such as gelatin or soup. Progress to regular foods as tolerated. Avoid greasy, spicy, heavy foods. If nausea and/or vomiting occur, drink only clear liquids until the nausea and/or vomiting subsides. Call your physician if vomiting continues.  Special Instructions/Symptoms: Your throat may feel dry or sore from the anesthesia or the breathing tube placed in your throat during surgery. If this causes discomfort, gargle with warm salt water. The discomfort should disappear within 24 hours.  If you had a scopolamine patch placed behind your ear for the management of post- operative nausea and/or vomiting:  1. The medication in the patch is effective for 72 hours, after which it should be removed.  Wrap patch in a tissue and discard in the trash. Wash hands thoroughly with soap and water. 2. You may remove the patch earlier than 72 hours if you experience unpleasant side effects which may include dry mouth, dizziness or visual disturbances. 3. Avoid touching the patch. Wash your hands with soap and water after contact with the patch.

## 2015-02-28 NOTE — Brief Op Note (Signed)
02/28/2015  3:47 PM  PATIENT:  Becky Rogers  64 y.o. female  PRE-OPERATIVE DIAGNOSIS:  LEFT URETERAL STONE  POST-OPERATIVE DIAGNOSIS:  LEFT URETERAL STONE  PROCEDURE:  Procedure(s): WITH HOLMIUM LASER  (Left) CYSTOSCOPY/RETROGRADE/URETEROSCOPY/STONE EXTRACTION WITH BASKET (Left)  SURGEON:  Surgeon(s) and Role:    * [name], MD - Primary  PHYSICIAN ASSISTANT:   ASSISTANTS: none   ANESTHESIA:   general  EBL:  Total I/O In: 100 [I.V.:100] Out: -   BLOOD ADMINISTERED:none  DRAINS: none   LOCAL MEDICATIONS USED:  NONE  SPECIMEN:  Source of Specimen:  left renal stone fragments  DISPOSITION OF SPECIMEN:  [facility] for compositional analysis  COUNTS:  YES  TOURNIQUET:  * No tourniquets in log *  DICTATION: .Other Dictation: Dictation Number see second note     PLAN OF CARE: Discharge to home after PACU  PATIENT DISPOSITION:  PACU - hemodynamically stable.   Delay start of Pharmacological VTE agent (>24hrs) due to surgical blood loss or risk of bleeding: not applicable

## 2015-03-03 LAB — POCT I-STAT, CHEM 8
BUN: 19 mg/dL (ref 6–20)
CHLORIDE: 105 mmol/L (ref 101–111)
CREATININE: 0.9 mg/dL (ref 0.44–1.00)
Calcium, Ion: 1.21 mmol/L (ref 1.13–1.30)
GLUCOSE: 75 mg/dL (ref 65–99)
HEMATOCRIT: 36 % (ref 36.0–46.0)
Hemoglobin: 12.2 g/dL (ref 12.0–15.0)
POTASSIUM: 3.6 mmol/L (ref 3.5–5.1)
Sodium: 143 mmol/L (ref 135–145)
TCO2: 24 mmol/L (ref 0–100)

## 2015-03-03 NOTE — Op Note (Signed)
[name], Rogers NO.:  [id number]  MEDICAL RECORD NO.:  [id number]  LOCATION:                               FACILITY:  [facility]  PHYSICIAN:  [name], MD     DATE OF BIRTH:  09-Mar-1951  DATE OF PROCEDURE:  02/28/2015                               OPERATIVE REPORT   PREOPERATIVE DIAGNOSES: 1. Left renal stone. 2. Very petite body habitus. 3. History of urosepsis.  PROCEDURE: 1. Cystoscopy with left retrograde pyelogram interpretation. 2. Exchange of left ureteral stent 5 x 24 Polaris with tether. 3. Left ureteroscopy with laser lithotripsy.  ESTIMATED BLOOD LOSS:  Nil.  COMPLICATIONS:  None.  SPECIMENS:  Left renal stone fragments for compositional analysis.  FINDINGS: 1. More favorable angulation of left lower pole stone following     interval stenting for passive dilation. 2. Complete resolution of all stone fragments larger than 1/3rd mm     from all accessible calices following holmium laser lithotripsy and     basket extraction. 3. Successful placement of left ureteral stent, proximal in renal     pelvis and distal in urinary bladder. 4. Some residual but improved proximal ureteral edema and angulation     upon impaction.  INDICATIONS:  Becky Rogers is a very pleasant, but somewhat unfortunate 64- year-old lady with a history of progressive dementia and Parkinsonism, who had an episode of septic kidney stone several months ago, which required ICU care and left nephrostomy tube and intravenous antibiotics. She subsequently was cleared of infectious parameters.  Followup cultures have been negative, and we discussed more definitive management for stone, clean left ureteroscopy, and she underwent first attempt of such procedures several weeks ago; however, due to her very petite body habitus and the extreme angulation of where her stone had been displaced in the lower pole, the stone was inaccessible at that time.  She therefore underwent  placement of ureteral stent to allow passive dilation and presents for re-attempt of ureteroscopic  stimulation today, no interval fevers.  Informed consent was obtained and placed in the medical record.  PROCEDURE IN DETAIL:  The patient being Becky Rogers was verified. Procedure being left ureteroscopy was confirmed.  Procedure was carried out.  Time-out was performed.  Intravenous antibiotics were administered.  General LMA anesthesia was introduced.  The patient was placed into a low lithotomy position.  Sterile field was created by prepping and draping the patient's vagina, introitus, and proximal thighs using iodine x3.  Next,  cystourethroscopy was performed using a 23-French rigid scope 30-degree offset lens.  Distal end of the left ureteral stent was seen in situ.  This was grasped and brought to the level of the urethral meatus.  A 0.038 ZIPwire was advanced into lower pole.  The stent was exchanged for a 6-French open-end catheter and left retrograde pyelogram was obtained.  Spot fluoroscopic images revealed a calcification in lower pole calyx and likely previously displaced proximal stone.  There was no hydroureteronephrosis or filling defects.  The ZIPwire was once again advanced at the level of upper pole and set aside as a safety wire.  An 8-French feeding tube was placed in urinary bladder  for pressure release.  Next, semi-rigid ureteroscopy was performed in the distal fourth phase of the left ureter alongside a separate Sensor working wire.  No mucosal abnormalities were found.  The semi-rigid scope was exchanged for a 12/14, 24 cm ureteral access sheath using continuous fluoroscopic guidance at the level of proximal ureter.  Next, flexible digital ureteroscopy was performed using a single-channel ureteroscope. Inspection of the more proximal ureter revealed  site of likely prior stone impaction in the proximal ureter with some mild relative narrowing and edema but no  focal stricturing whatsoever.  Inspection of all calices revealed much more favorable angulation having been intervally stented with a lower pole stone now accessible.  Using a single-channel ureteroscope, this  was repositioned using escape basket to the upper pole but it appeared to be too large for simple basketing removal.  As such, holmium laser energy applied to the stone using settings of 0.2 joules and 30 Hz, fragmenting the stone into approximately 4 smaller pieces that were then sequentially grasped on the long axis, brought out their entirety, set aside for compositional analysis.  Following the removal, there was excellent hemostasis, no evidence renal perforation, and there was complete resolution of all stone fragments larger than 1/3rd mm.  The access sheath was removed under continuous ureteroscopic vision.  No significant mucosal  abnormalities were found.  Given the patient's history of prior infections and relative proximal ureteral narrowing, it was felt that interval stenting would be warranted following the procedure today. As such, a new 5 x 24 Polaris-type stent was placed using fluoroscopic guidance.  Good proximal and distal deployment were noted.  Tether was left in place and fashioned to the inner thigh.  Procedure was terminated, and the patient tolerated the procedure well.  There were no immediate periprocedural complications. The patient was taken to the postanesthesia care unit in stable condition.          ______________________________ [name], MD     TM/MEDQ  D:  02/28/2015  T:  03/01/2015  Job:  [id number]

## 2015-03-05 ENCOUNTER — Encounter (HOSPITAL_BASED_OUTPATIENT_CLINIC_OR_DEPARTMENT_OTHER): Payer: Self-pay | Admitting: Urology

## 2015-03-24 ENCOUNTER — Encounter: Payer: BLUE CROSS/BLUE SHIELD | Admitting: Physical Medicine & Rehabilitation

## 2015-03-27 ENCOUNTER — Telehealth: Payer: Self-pay | Admitting: *Deleted

## 2015-03-27 NOTE — Telephone Encounter (Signed)
Vinnie Level, Speech Pathologist from [facility] called asking for verbal orders to recert pt for home health speech therapy.  She is asking for 1x for 7 weeks.  Verbal order was given

## 2015-04-02 ENCOUNTER — Encounter
Payer: BLUE CROSS/BLUE SHIELD | Attending: Physical Medicine & Rehabilitation | Admitting: Physical Medicine & Rehabilitation

## 2015-04-02 ENCOUNTER — Encounter: Payer: Self-pay | Admitting: Physical Medicine & Rehabilitation

## 2015-04-02 VITALS — BP 139/77 | HR 63

## 2015-04-02 DIAGNOSIS — L821 Other seborrheic keratosis: Secondary | ICD-10-CM | POA: Insufficient documentation

## 2015-04-02 DIAGNOSIS — F0390 Unspecified dementia without behavioral disturbance: Secondary | ICD-10-CM | POA: Insufficient documentation

## 2015-04-02 DIAGNOSIS — I10 Essential (primary) hypertension: Secondary | ICD-10-CM | POA: Diagnosis not present

## 2015-04-02 DIAGNOSIS — G9341 Metabolic encephalopathy: Secondary | ICD-10-CM | POA: Insufficient documentation

## 2015-04-02 DIAGNOSIS — E785 Hyperlipidemia, unspecified: Secondary | ICD-10-CM | POA: Insufficient documentation

## 2015-04-02 DIAGNOSIS — L539 Erythematous condition, unspecified: Secondary | ICD-10-CM | POA: Insufficient documentation

## 2015-04-02 DIAGNOSIS — F419 Anxiety disorder, unspecified: Secondary | ICD-10-CM | POA: Insufficient documentation

## 2015-04-02 DIAGNOSIS — G20 Parkinson's disease: Secondary | ICD-10-CM | POA: Diagnosis present

## 2015-04-02 DIAGNOSIS — N201 Calculus of ureter: Secondary | ICD-10-CM | POA: Insufficient documentation

## 2015-04-02 NOTE — Patient Instructions (Addendum)
ARTANE: 1MG  TWICE DAILY FOR ONE WEEK THEN STOP.   ESTABLISH A DAILY ROUTINE!!!!!!!

## 2015-04-02 NOTE — Progress Notes (Signed)
Subjective:    Patient ID: Becky Rogers, female    DOB: [birth year], 63 y.o.   MRN: [id number]  HPI   Becky Rogers is here in follow up of her rehab stay and associated cognitive and functional deficits. She was admitted for kidney stones in august and her husband reports that her cognition worsened after the stent, lithotripsy perhaps due to anesthesia. She is home but going to adult day care during the day. He reports short term memory issues, problems with organization and initiation. She does have a SLP seeing her at home for cognitive issues.    Husband is wondering if she can come off of her artane as he worries it may be affecting her cognitively. She was placed on it for mild tremors by neurology. He states that the neurologist really didn't acknowledge any questions when asked about it previously.   Her skin has healed. She is wearing normal shoewear. Her gait and balance are good.   Becky Rogers would like to return to driving.    Pain Inventory Average Pain 0 Pain Right Now 0 My pain is no pain  In the last 24 hours, has pain interfered with the following? General activity 0 Relation with others 0 Enjoyment of life 0 What TIME of day is your pain at its worst? no pain Sleep (in general) NA  Pain is worse with: no pain Pain improves with: no pain Relief from Meds: no pain  Mobility walk without assistance  Function disabled: date disabled . I need assistance with the following:  meal prep, household duties and shopping  Neuro/Psych confusion  Cognitive issues  Prior Studies Any changes since last visit?  no  Physicians involved in your care Any changes since last visit?  no   History reviewed. No pertinent family history. Social History   Social History  . Marital Status: Married    Spouse Name: N/A  . Number of Children: N/A  . Years of Education: N/A   Social History Main Topics  . Smoking status: Never Smoker   . Smokeless tobacco: Never Used    . Alcohol Use: Yes     Comment: RARE  . Drug Use: No  . Sexual Activity: Not Asked   Other Topics Concern  . None   Social History Narrative   Past Surgical History  Procedure Laterality Date  . Cesarean section  x2  . Ovarian cyst removal  age 44  . Appendectomy    . Colonoscopy    . Breast lumpectomy with radioactive seed localization Right 08/28/2014    Procedure: RIGHT BREAST LUMPECTOMY WITH RADIOACTIVE SEED LOCALIZATION;  Surgeon: [name], MD;  Location: [facility];  Service: General;  Laterality: Right;  . Cystoscopy with retrograde pyelogram, ureteroscopy and stent placement Left 02/07/2015    Procedure: CYSTOSCOPY WITH LEFT RETROGRADE PYELOGRAM, URETEROSCOPY AND STENT PLACEMENT. REMOVAL OF UROSTOMY TUBE;  Surgeon: [name], MD;  Location: [facility];  Service: Urology;  Laterality: Left;  . Negative sleep study  2013  . Holmium laser application Left 7/82/9562    Procedure: WITH HOLMIUM LASER ;  Surgeon: [name], MD;  Location: [facility];  Service: Urology;  Laterality: Left;  . Cystoscopy/retrograde/ureteroscopy/stone extraction with basket Left 02/28/2015    Procedure: CYSTOSCOPY/RETROGRADE/URETEROSCOPY/STONE EXTRACTION WITH BASKET;  Surgeon: [name], MD;  Location: [facility];  Service: Urology;  Laterality: Left;  . Cystoscopy w/ ureteral stent placement Left 02/28/2015    Procedure: CYSTOSCOPY WITH STENT REPLACEMENT;  Surgeon: [name], MD;  [facility];  Service: Urology;  Laterality: Left;   Past Medical History  Diagnosis Date  . Parkinson disease   . Anxiety   . Keratosis seborrheica   . Erythema   . 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 Ischemic finger     erythemic of fingers secondary to septic shock 12-17-2014--  HEALING  . History of septic shock   . Hyperlipidemia   . Mild dementia   . Left ureteral  stone   . Hypertension     CURRENTLY NOT TAKING MED SECONDARY TO HYPOTENTION FROM SEPSIS--  MONITORED BY PCP  . History of Clostridium difficile     dx 02-07-2015--  resolved   BP 139/77 mmHg  Pulse 63  SpO2 100%  Opioid Risk Score:   Fall Risk Score:  `1  Depression screen PHQ 2/9  No flowsheet data found.   Review of Systems  Psychiatric/Behavioral:       Cognitive issues since surgery  All other systems reviewed and are negative.      Objective:   Physical Exam  Constitutional: She is oriented to person, place, and time with cueing. She appears well-developed and well-nourished.  HENT:  Head: Normocephalic and atraumatic.  Right Ear: External ear normal.  Left Ear: External ear normal.  Mouth/Throat: Oropharynx is clear and moist.  Eyes: Conjunctivae are normal. Pupils are equal, round, and reactive to light.  Neck: Normal range of motion. Neck supple. No JVD present. No tracheal deviation present. No thyromegaly present.  Cardiovascular: Normal rate and regular rhythm. Exam reveals no friction rub.  No murmur heard. Respiratory: Effort normal. GI: Soft. Bowel sounds are normal. She exhibits no distension. There is no tenderness. There is no rebound.  Musculoskeletal: no edema  Neurological: She is alert and oriented to person, place, and time with cueing. She remember 0/3 words after 5 minutes. Could organize simple numbers with cueing. spellled the word "world" forward nad bacwards without problems. Recalled current affairs. Was able to interpret abstract meaning. Attention was fair. No cranial nerve deficit. Coordination normal. Skin: Skin is warm and dry. Wounds healing. Nails improved.  Psychiatric: She has a normal mood and affect. No agitation or anxiety    Assessment/Plan: 1. Functional deficits secondary to Encephalopathy and debility after sepsis. -ongoing memory issues, ?orsening memory after stent/lithotripsy  -she appears to be  improving in my opinion although her STM is an issue, ?mild attention problems also  -discussed the idea of using a routine either through a hand held device,calendar or dry erase board to keep her centered every day and to use as a base to recall daily needs/activities, etc.   -continued cognitive stimulation at [facility]  -i would expect some improvement over the next several weeks  -consider medication for memory also 2. Skin/Wound Care: fingers and toes healed 3. Left ureteral stone: mgt per uorlogy 4.. Parkinson's disease: On Sinemet 50/200 tid and artane bid  -wean artane to off over 1 week's time.   - recommend continued follow up with neurolgy as well  Thirty minutes of face to face patient care time were spent during this visit. All questions were encouraged and answered. follow up  With me in 3 months.

## 2015-05-09 ENCOUNTER — Telehealth: Payer: Self-pay | Admitting: Physical Medicine & Rehabilitation

## 2015-05-09 DIAGNOSIS — I998 Other disorder of circulatory system: Secondary | ICD-10-CM

## 2015-05-09 DIAGNOSIS — S81802S Unspecified open wound, left lower leg, sequela: Secondary | ICD-10-CM | POA: Diagnosis not present

## 2015-05-09 DIAGNOSIS — S60423A Blister (nonthermal) of left middle finger, initial encounter: Secondary | ICD-10-CM | POA: Diagnosis not present

## 2015-05-09 DIAGNOSIS — G20 Parkinson's disease: Secondary | ICD-10-CM

## 2015-05-09 DIAGNOSIS — S81801S Unspecified open wound, right lower leg, sequela: Secondary | ICD-10-CM | POA: Diagnosis not present

## 2015-05-09 DIAGNOSIS — S60425A Blister (nonthermal) of left ring finger, initial encounter: Secondary | ICD-10-CM | POA: Diagnosis not present

## 2015-05-09 NOTE — Telephone Encounter (Signed)
Becky Rogers at [phone] and gave verbal to continue speech therapy for 2 more weeks effective 05-11-15 per Dr. Naaman Plummer.

## 2015-07-02 ENCOUNTER — Encounter
Payer: BLUE CROSS/BLUE SHIELD | Attending: Physical Medicine & Rehabilitation | Admitting: Physical Medicine & Rehabilitation

## 2015-07-02 ENCOUNTER — Encounter: Payer: Self-pay | Admitting: Physical Medicine & Rehabilitation

## 2015-07-02 VITALS — BP 144/81 | HR 80

## 2015-07-02 DIAGNOSIS — F419 Anxiety disorder, unspecified: Secondary | ICD-10-CM | POA: Insufficient documentation

## 2015-07-02 DIAGNOSIS — E785 Hyperlipidemia, unspecified: Secondary | ICD-10-CM | POA: Diagnosis not present

## 2015-07-02 DIAGNOSIS — F0390 Unspecified dementia without behavioral disturbance: Secondary | ICD-10-CM | POA: Insufficient documentation

## 2015-07-02 DIAGNOSIS — L539 Erythematous condition, unspecified: Secondary | ICD-10-CM | POA: Diagnosis not present

## 2015-07-02 DIAGNOSIS — I10 Essential (primary) hypertension: Secondary | ICD-10-CM | POA: Insufficient documentation

## 2015-07-02 DIAGNOSIS — G9341 Metabolic encephalopathy: Secondary | ICD-10-CM | POA: Insufficient documentation

## 2015-07-02 DIAGNOSIS — N201 Calculus of ureter: Secondary | ICD-10-CM | POA: Insufficient documentation

## 2015-07-02 DIAGNOSIS — G20 Parkinson's disease: Secondary | ICD-10-CM | POA: Insufficient documentation

## 2015-07-02 DIAGNOSIS — L821 Other seborrheic keratosis: Secondary | ICD-10-CM | POA: Diagnosis not present

## 2015-07-02 NOTE — Progress Notes (Signed)
Subjective:    Patient ID: Becky Rogers, female    DOB: 04/16/1951, 64 y.o.   MRN: [id number]  HPI   [name] is here regarding her parkinson's disease and encephalopathy. She has continued to progress. She has done much better off the artane. Her cognition is close to baseline. Husband has taken her driving which she has done fairly well. She is cooking at home. She is able to keep her self organized using cues/calendar/organizer etc. She is still at the adult day care for afew hours each day. Husband asks if she's ready to be discharged.     Pain Inventory Average Pain 1 Pain Right Now 1 My pain is constant and Frost Bite Feeling  In the last 24 hours, has pain interfered with the following? General activity 0 Relation with others 0 Enjoyment of life 0 What TIME of day is your pain at its worst? Daytime and Night Sleep (in general) Good  Pain is worse with: NA Pain improves with: NA Relief from Meds: 0  Mobility walk without assistance how many minutes can you walk? 90 ability to climb steps?  yes do you drive?  no Do you have any goals in this area?  yes  Function not employed: date last employed 01/20/2014 Do you have any goals in this area?  no  Neuro/Psych No problems in this area  Prior Studies Any changes since last visit?  no  Physicians involved in your care Any changes since last visit?  no   History reviewed. No pertinent family history. Social History   Social History  . Marital Status: Married    Spouse Name: N/A  . Number of Children: N/A  . Years of Education: N/A   Social History Main Topics  . Smoking status: Never Smoker   . Smokeless tobacco: Never Used  . Alcohol Use: Yes     Comment: RARE  . Drug Use: No  . Sexual Activity: Not Asked   Other Topics Concern  . None   Social History Narrative   Past Surgical History  Procedure Laterality Date  . Cesarean section  x2  . Ovarian cyst removal  age 25  . Appendectomy    .  Colonoscopy    . Breast lumpectomy with radioactive seed localization Right 08/28/2014    Procedure: RIGHT BREAST LUMPECTOMY WITH RADIOACTIVE SEED LOCALIZATION;  Surgeon: [name], MD;  Location: [facility];  Service: General;  Laterality: Right;  . Cystoscopy with retrograde pyelogram, ureteroscopy and stent placement Left 02/07/2015    Procedure: CYSTOSCOPY WITH LEFT RETROGRADE PYELOGRAM, URETEROSCOPY AND STENT PLACEMENT. REMOVAL OF UROSTOMY TUBE;  Surgeon: [name], MD;  Location: [facility];  Service: Urology;  Laterality: Left;  . Negative sleep study  2013  . Holmium laser application Left [id number]    Procedure: WITH HOLMIUM LASER ;  Surgeon: [name], MD;  Location: [facility];  Service: Urology;  Laterality: Left;  . Cystoscopy/retrograde/ureteroscopy/stone extraction with basket Left 02/28/2015    Procedure: CYSTOSCOPY/RETROGRADE/URETEROSCOPY/STONE EXTRACTION WITH BASKET;  Surgeon: [name], MD;  Location: [facility];  Service: Urology;  Laterality: Left;  . Cystoscopy w/ ureteral stent placement Left 02/28/2015    Procedure: CYSTOSCOPY WITH STENT REPLACEMENT;  Surgeon: [name], MD;  Location: [facility];  Service: Urology;  Laterality: Left;   Past Medical History  Diagnosis Date  . Parkinson disease ([place])   . Anxiety   . Keratosis seborrheica   . Erythema   .  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 ([place])     secondary to septic shock 12-17-2014--  healing  . Ischemic finger     erythemic of fingers secondary to septic shock 12-17-2014--  HEALING  . History of septic shock   . Hyperlipidemia   . Mild dementia   . Left ureteral stone   . Hypertension     CURRENTLY NOT TAKING MED SECONDARY TO HYPOTENTION FROM SEPSIS--  MONITORED BY PCP  . History of Clostridium difficile     dx 02-07-2015--  resolved   BP 144/81 mmHg  Pulse 80  SpO2 100%  Opioid Risk Score:   Fall Risk Score:   `1  Depression screen PHQ 2/9  Depression screen PHQ 2/9 04/02/2015  Decreased Interest 0  Down, Depressed, Hopeless 0  PHQ - 2 Score 0  Altered sleeping 1  Tired, decreased energy 3  Change in appetite 1  Feeling bad or failure about yourself  1  Trouble concentrating 3  Moving slowly or fidgety/restless 1  Suicidal thoughts 0  PHQ-9 Score 10  Difficult doing work/chores Very difficult     Review of Systems  All other systems reviewed and are negative.      Objective:   Physical Exam  Constitutional: She is oriented to person, place, and time with cueing. She appears well-developed and well-nourished.  HENT:  Head: Normocephalic and atraumatic.  Right Ear: External ear normal.  Left Ear: External ear normal.  Mouth/Throat: Oropharynx is clear and moist.  Eyes: Conjunctivae are normal. Pupils are equal, round, and reactive to light.  Neck: Normal range of motion. Neck supple. No JVD present. No tracheal deviation present. No thyromegaly present.  Cardiovascular: Normal rate and regular rhythm. Exam reveals no friction rub.  No murmur heard.  Respiratory: Effort normal.  GI: Soft. Bowel sounds are normal. She exhibits no distension. There is no tenderness. There is no rebound.  Musculoskeletal: no edema. Finger tips cool.  Neurological: She is alert and oriented to person, place, and time with cueing. Improved stm and ltm. Insight and awareness much improved. Still with small essential tremor. Better with engagement of the hands.  Skin: Skin is warm and dry. Wounds healing. Nails improved.  Psychiatric: She has a normal mood and affect. No agitation or anxiety    Assessment/Plan:  1. Functional deficits secondary to Encephalopathy and debility after sepsis.  -improved cognition -encouraged ongoing routine and strategies for oganization and memory -she is ready to be alone at home.  2. Skin/Wound Care: fingers and toes healed   -pt with cool fingers--likely due to her  sepsis/necrosis while in hospital.   -will need to keep fingers warm with gloves, etc. Minimal pain assoc 3.Hx of Left ureteral stone: mgt per uorlogy  4.. Parkinson's disease: On Sinemet 50/200 tid   -off artane with cognitive improvement -recommend continued follow up with neurolgy as well    15 minutes of face to face patient care time were spent during this visit. All questions were encouraged and answered. follow up With me PRN

## 2015-09-02 ENCOUNTER — Other Ambulatory Visit: Payer: Self-pay | Admitting: Family Medicine

## 2015-09-02 DIAGNOSIS — C50911 Malignant neoplasm of unspecified site of right female breast: Secondary | ICD-10-CM

## 2015-09-08 ENCOUNTER — Ambulatory Visit
Admission: RE | Admit: 2015-09-08 | Discharge: 2015-09-08 | Disposition: A | Discharge status: Home / Self Care | Payer: Medicare Other | Source: Ambulatory Visit | Attending: Family Medicine | Admitting: Family Medicine

## 2015-09-08 DIAGNOSIS — C50911 Malignant neoplasm of unspecified site of right female breast: Secondary | ICD-10-CM

## 2015-09-12 DIAGNOSIS — E785 Hyperlipidemia, unspecified: Secondary | ICD-10-CM | POA: Insufficient documentation

## 2015-09-12 DIAGNOSIS — A419 Sepsis, unspecified organism: Secondary | ICD-10-CM | POA: Insufficient documentation

## 2015-09-12 DIAGNOSIS — R197 Diarrhea, unspecified: Secondary | ICD-10-CM | POA: Insufficient documentation

## 2016-05-03 IMAGING — CT CT HEAD W/O CM
1 of 3 series · 16 of 30 positions shown, 20 images · non-contrast
Comparison: None.

CLINICAL DATA: Altered mental status, sepsis.

EXAM:
CT HEAD WITHOUT CONTRAST
TECHNIQUE: Contiguous axial images were obtained from the base of the skull
through the vertex without intravenous contrast.

[Series 3: head 2.0 h70h · axial · 0.42mm/px · z∈[+398,+534]mm · 16 of 78 slices shown, 20 images]
[im 5/78  brain]
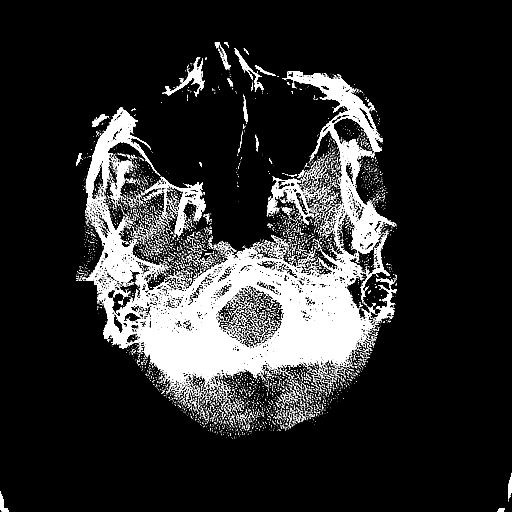
[im 5/78  bone]
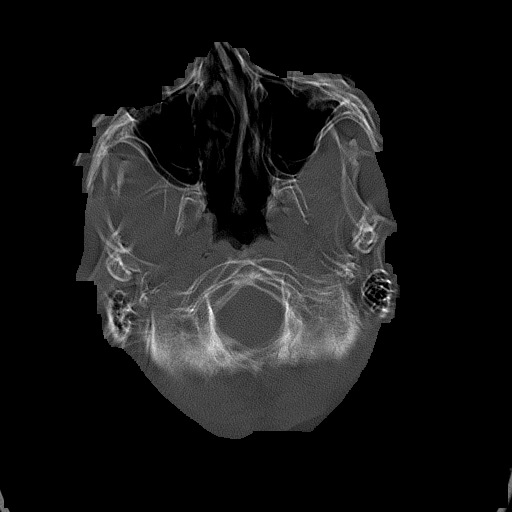
[im 9/78  brain]
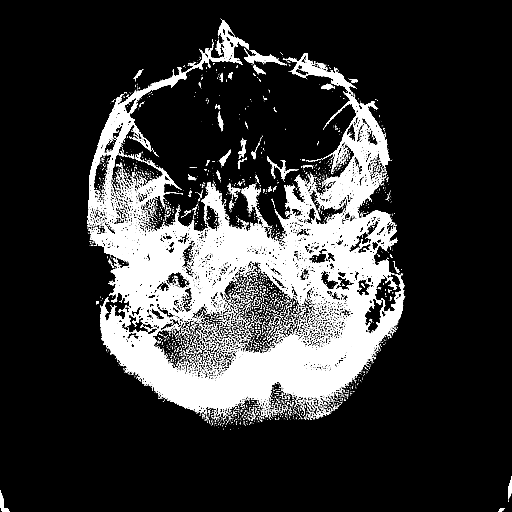
[im 13/78  brain]
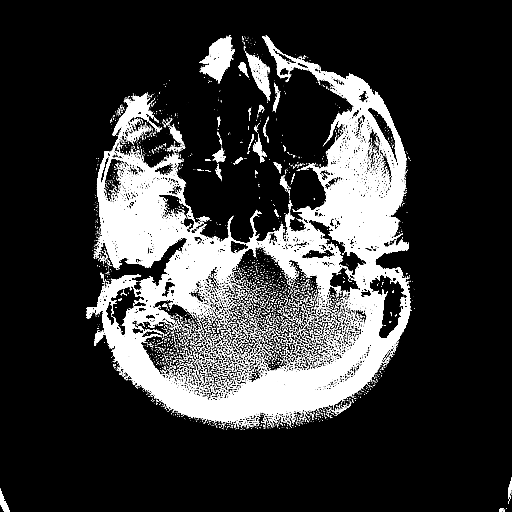
[im 17/78  brain]
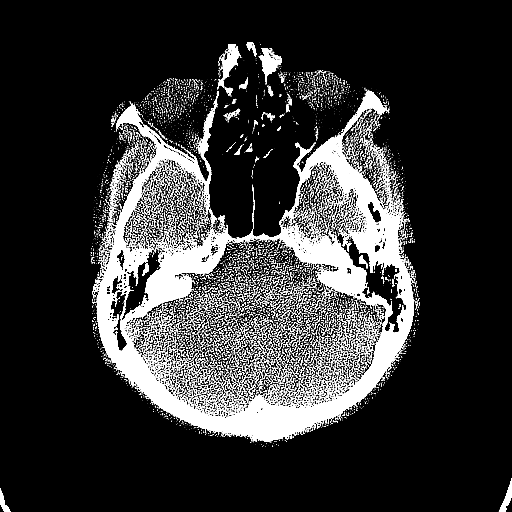
[im 25/78  brain]
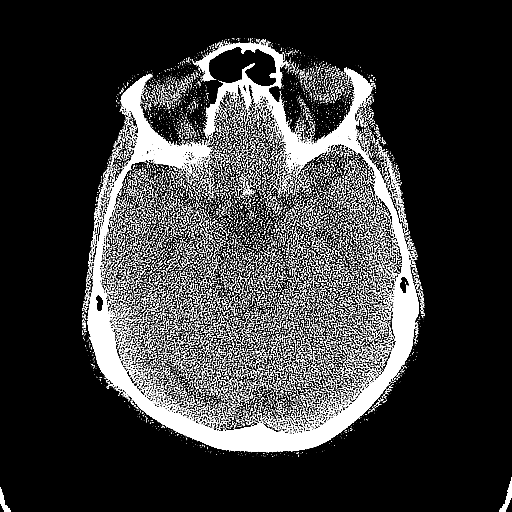
[im 25/78  bone]
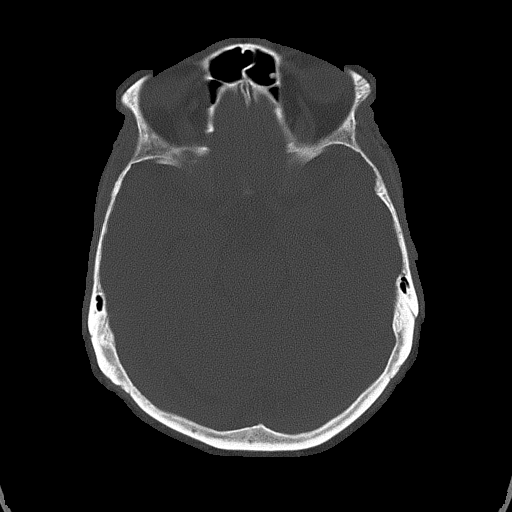
[im 29/78  brain]
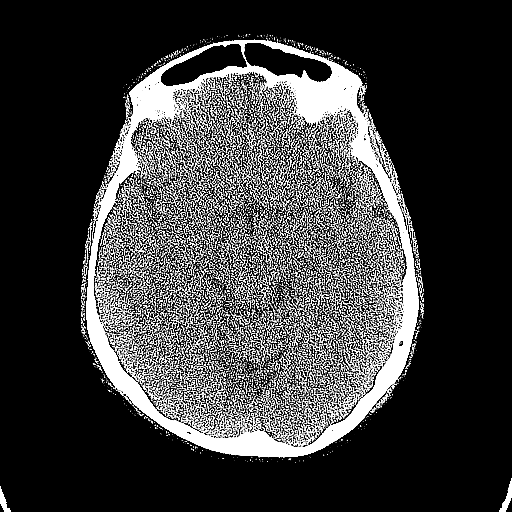
[im 33/78  brain]
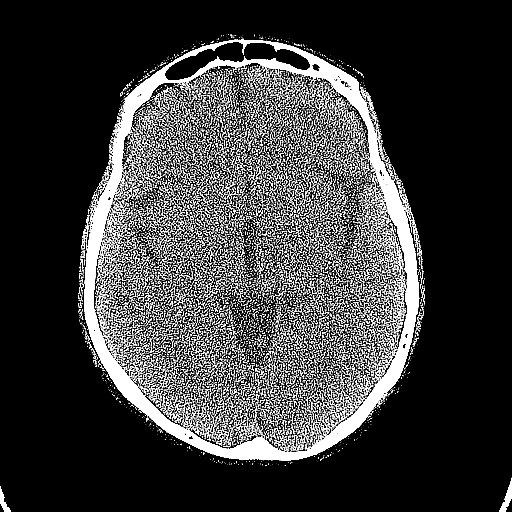
[im 37/78  brain]
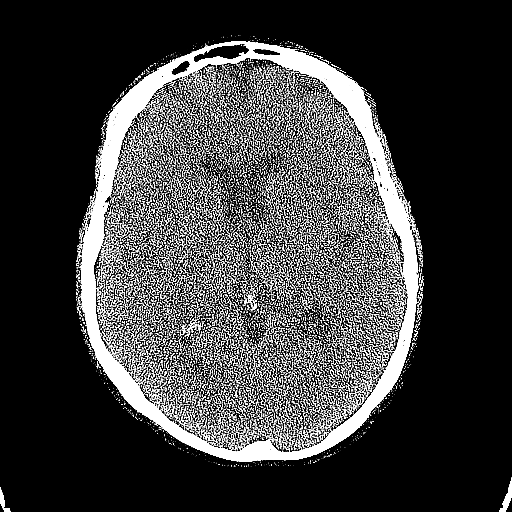
[im 41/78  brain]
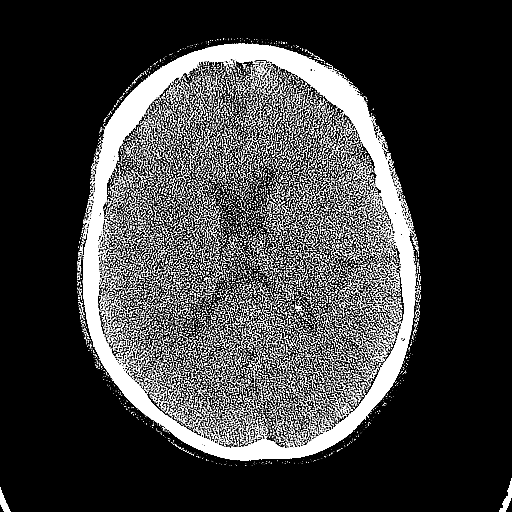
[im 41/78  bone]
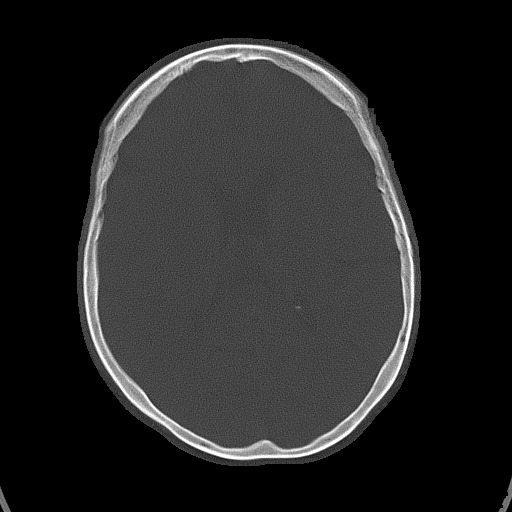
[im 45/78  brain]
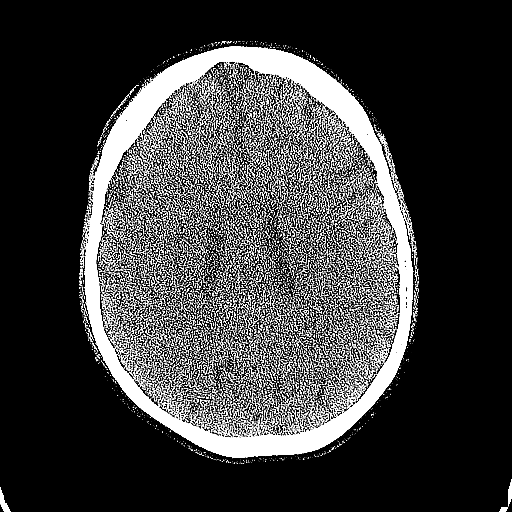
[im 49/78  brain]
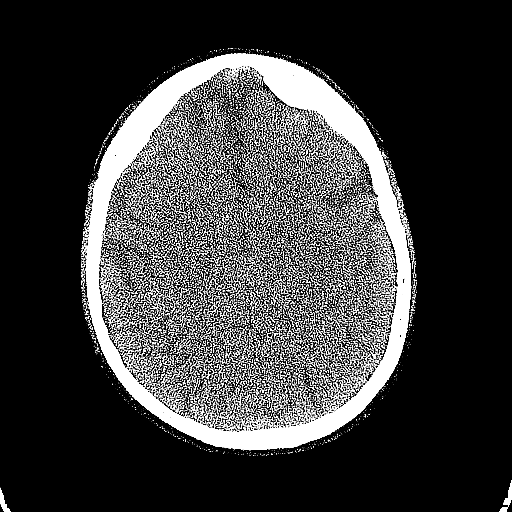
[im 53/78  brain]
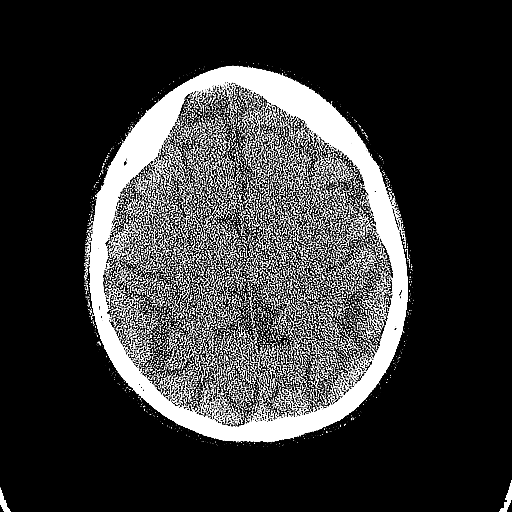
[im 61/78  brain]
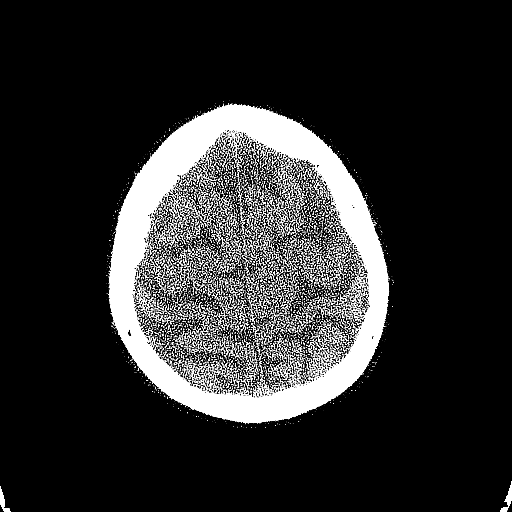
[im 61/78  bone]
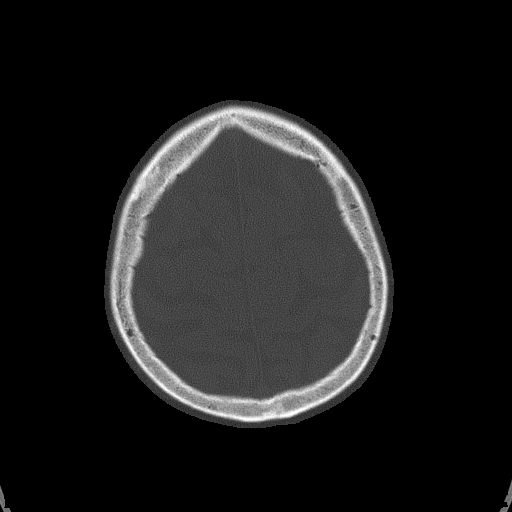
[im 65/78  brain]
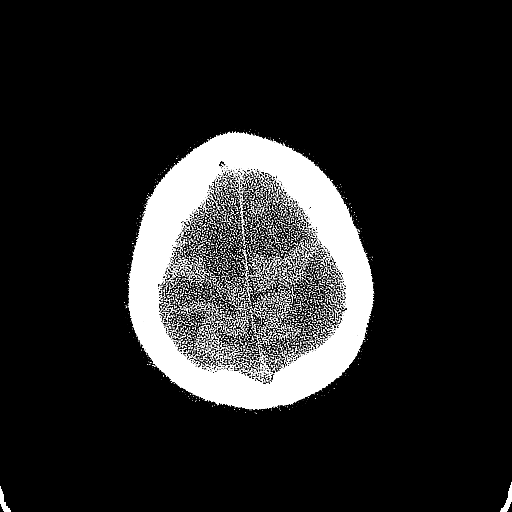
[im 69/78  brain]
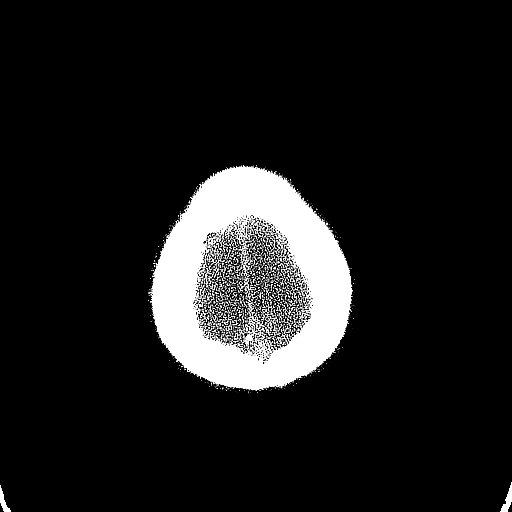
[im 73/78  brain]
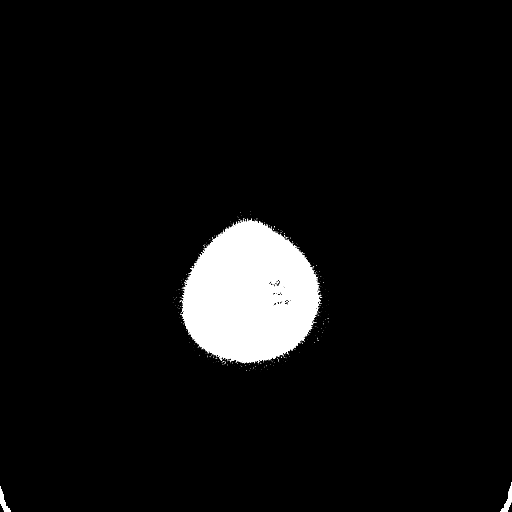

[16 of 30 positions shown; findings below may reference images not displayed]

FINDINGS: No acute intracranial abnormality. Specifically, no hemorrhage,
hydrocephalus, mass lesion, acute infarction, or significant
intracranial injury. No acute calvarial abnormality. Visualized
paranasal sinuses and mastoids clear. Orbital soft tissues
unremarkable.
IMPRESSION: No acute intracranial abnormality.

## 2016-05-06 IMAGING — CR DG CHEST 1V PORT
1 series · 1 of 1 positions shown · non-contrast
Comparison: January 13, 2015

CLINICAL DATA: Shortness of Breath

EXAM:
PORTABLE CHEST - 1 VIEW

[AP]
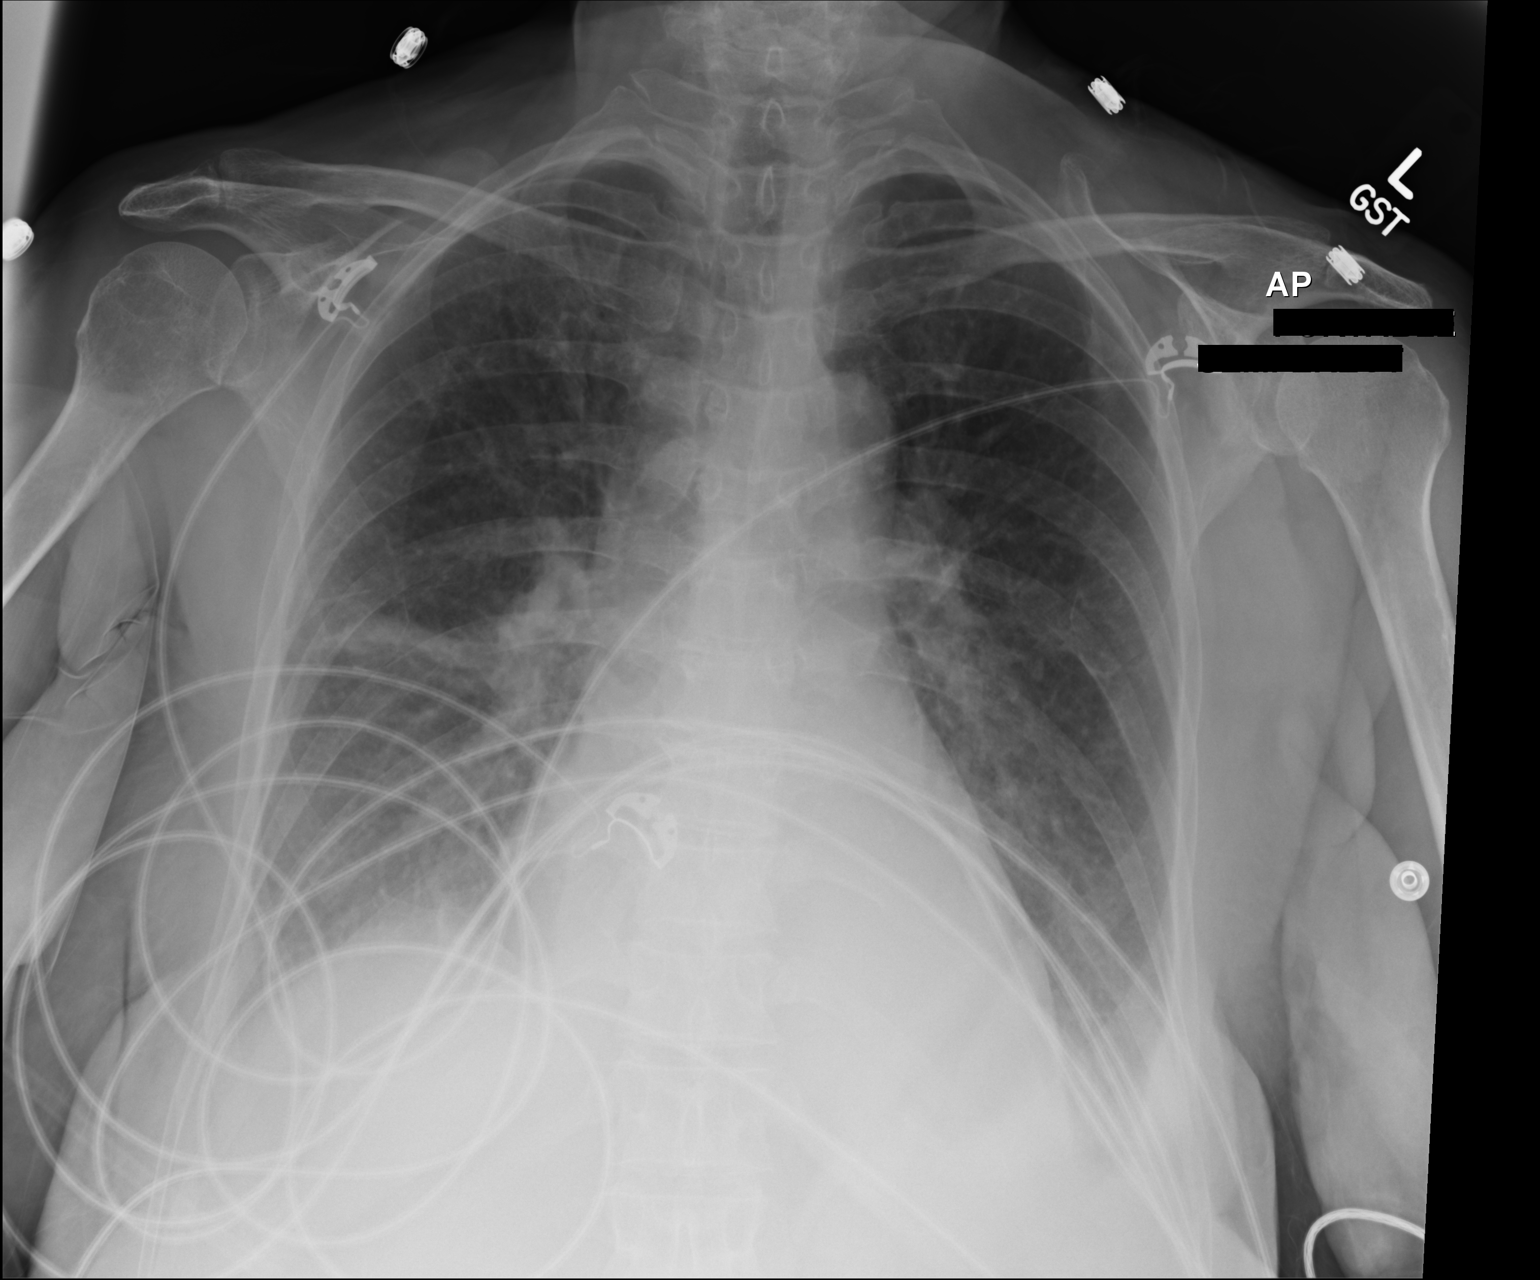

[1 of 1 positions shown; findings below may reference images not displayed]

FINDINGS: There has been considerable clearing of interstitial edema compared
to 2 days prior. There remains left lower lobe consolidation with
left effusion. There is a smaller right effusion. There is
atelectatic change in the right mid lung. Heart is upper normal in
size with pulmonary vascularity within normal limits. No adenopathy.
IMPRESSION: Considerable clearing of interstitial edema compared to recent prior
study. Persistent left lower lobe consolidation with small left
effusion. A smaller right effusion is also present. Right midlung
atelectatic change present.

## 2016-07-13 ENCOUNTER — Emergency Department (HOSPITAL_COMMUNITY)
Admission: EM | Admit: 2016-07-13 | Discharge: 2016-07-13 | Disposition: A | Discharge status: Home / Self Care | Payer: Medicare Other | Attending: Emergency Medicine | Admitting: Emergency Medicine

## 2016-07-13 ENCOUNTER — Encounter (HOSPITAL_COMMUNITY): Payer: Self-pay

## 2016-07-13 ENCOUNTER — Emergency Department (HOSPITAL_COMMUNITY): Payer: Medicare Other

## 2016-07-13 DIAGNOSIS — Z7982 Long term (current) use of aspirin: Secondary | ICD-10-CM | POA: Insufficient documentation

## 2016-07-13 DIAGNOSIS — Z79899 Other long term (current) drug therapy: Secondary | ICD-10-CM | POA: Insufficient documentation

## 2016-07-13 DIAGNOSIS — R935 Abnormal findings on diagnostic imaging of other abdominal regions, including retroperitoneum: Secondary | ICD-10-CM | POA: Insufficient documentation

## 2016-07-13 DIAGNOSIS — G20 Parkinson's disease: Secondary | ICD-10-CM | POA: Insufficient documentation

## 2016-07-13 DIAGNOSIS — W228XXA Striking against or struck by other objects, initial encounter: Secondary | ICD-10-CM | POA: Diagnosis not present

## 2016-07-13 DIAGNOSIS — Y929 Unspecified place or not applicable: Secondary | ICD-10-CM | POA: Diagnosis not present

## 2016-07-13 DIAGNOSIS — S0101XA Laceration without foreign body of scalp, initial encounter: Secondary | ICD-10-CM

## 2016-07-13 DIAGNOSIS — Y939 Activity, unspecified: Secondary | ICD-10-CM | POA: Diagnosis not present

## 2016-07-13 DIAGNOSIS — S0990XA Unspecified injury of head, initial encounter: Secondary | ICD-10-CM | POA: Diagnosis present

## 2016-07-13 DIAGNOSIS — Y999 Unspecified external cause status: Secondary | ICD-10-CM | POA: Diagnosis not present

## 2016-07-13 DIAGNOSIS — I10 Essential (primary) hypertension: Secondary | ICD-10-CM | POA: Insufficient documentation

## 2016-07-13 DIAGNOSIS — R350 Frequency of micturition: Secondary | ICD-10-CM | POA: Insufficient documentation

## 2016-07-13 LAB — I-STAT CHEM 8, ED
BUN: 22 mg/dL — AB (ref 6–20)
CREATININE: 0.8 mg/dL (ref 0.44–1.00)
Calcium, Ion: 1.15 mmol/L (ref 1.15–1.40)
Chloride: 112 mmol/L — ABNORMAL HIGH (ref 101–111)
GLUCOSE: 100 mg/dL — AB (ref 65–99)
HCT: 41 % (ref 36.0–46.0)
HEMOGLOBIN: 13.9 g/dL (ref 12.0–15.0)
POTASSIUM: 3.5 mmol/L (ref 3.5–5.1)
Sodium: 144 mmol/L (ref 135–145)
TCO2: 21 mmol/L (ref 0–100)

## 2016-07-13 LAB — URINALYSIS, ROUTINE W REFLEX MICROSCOPIC
BILIRUBIN URINE: NEGATIVE
GLUCOSE, UA: NEGATIVE mg/dL
Hgb urine dipstick: NEGATIVE
KETONES UR: NEGATIVE mg/dL
LEUKOCYTES UA: NEGATIVE
Nitrite: NEGATIVE
PH: 6 (ref 5.0–8.0)
Protein, ur: NEGATIVE mg/dL
Specific Gravity, Urine: 1.005 (ref 1.005–1.030)

## 2016-07-13 MED ORDER — LIDOCAINE-EPINEPHRINE 1 %-1:100000 IJ SOLN
10.0000 mL | Freq: Once | INTRAMUSCULAR | Status: AC
  Administered 2016-07-13: 10 mL

## 2016-07-13 MED ORDER — PHENAZOPYRIDINE HCL 200 MG PO TABS: 200.0000 mg | ORAL_TABLET | Freq: Two times a day (BID) | ORAL | 0 refills | Status: AC | PRN

## 2016-07-13 MED ORDER — PHENAZOPYRIDINE HCL 200 MG PO TABS: 200.0000 mg | ORAL_TABLET | Freq: Two times a day (BID) | ORAL | 0 refills | Status: DC | PRN

## 2016-07-13 MED ORDER — LORAZEPAM 2 MG/ML IJ SOLN
0.5000 mg | Freq: Once | INTRAMUSCULAR | Status: AC
  Administered 2016-07-13: 0.5 mg via INTRAVENOUS
  Filled 2016-07-13: qty 1

## 2016-07-13 NOTE — ED Provider Notes (Signed)
[facility] DEPT Provider Note   CSN: [id number] Arrival date & time: 07/13/16  1211     History   Chief Complaint Chief Complaint  Patient presents with  . Head Laceration  . Urinary Frequency    HPI Becky Rogers is a 66 y.o. female who presents to the ED with cc of urinary frequency and head laceration. Patient has a hx of parkinsons and mild dementia. She c/o urgency and frequency of urination but has been unable to make much urine.  Her husband states that this morning she had an unwitnessed fall and has a laceration to the back of her head. She has been otherwise acting her baseline. No fevers.   HPI  Past Medical History:  Diagnosis Date  . Anxiety   . Erythema   . History of Clostridium difficile    dx 02-07-2015--  resolved  . History of septic shock   . Hyperlipidemia   . Hypertension    CURRENTLY NOT TAKING MED SECONDARY TO HYPOTENTION FROM SEPSIS--  MONITORED BY PCP  . Ischemic finger    erythemic of fingers secondary to septic shock 12-17-2014--  HEALING  . 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 Keratosis seborrheica   . Left ureteral stone   . Mild dementia   . Parkinson disease [facility])     Patient Active Problem List   Diagnosis Date Noted  . Ischemia of toes bilaterally 01/27/2015  . Ischemia of fingers--bilaterally 01/27/2015  . Hypokalemia 01/24/2015  . Edema--BLE 01/24/2015  . Leucocytosis 01/24/2015  . Bacteremia due to Escherichia coli 01/24/2015  . Debility 01/16/2015  . Acute renal failure syndrome ([place])   . E. coli UTI   . Leukocytosis   . 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 Thrombocytopenia ([place])   . Metabolic encephalopathy   . Acute respiratory failure with hypoxia ([place])   . Parkinson's disease ([place])   . Altered mental status   . Encounter for central line placement   . Pulmonary infiltrates   . Abdominal pain, lower   . Hypoxia   . Pyelonephritis   .  Pyonephrosis   . Septic shock ([place]) 01/09/2015  . Abnormal mammogram of right breast 08/28/2014    Past Surgical History:  Procedure Laterality Date  . APPENDECTOMY    . BREAST LUMPECTOMY WITH RADIOACTIVE SEED LOCALIZATION Right 08/28/2014   Procedure: RIGHT BREAST LUMPECTOMY WITH RADIOACTIVE SEED LOCALIZATION;  Surgeon: [name], MD;  Location: [facility];  Service: General;  Laterality: Right;  . CESAREAN SECTION  x2  . COLONOSCOPY    . CYSTOSCOPY W/ URETERAL STENT PLACEMENT Left 02/28/2015   Procedure: CYSTOSCOPY WITH STENT REPLACEMENT;  Surgeon: [name], MD;  Location: [facility];  Service: Urology;  Laterality: Left;  . CYSTOSCOPY WITH RETROGRADE PYELOGRAM, URETEROSCOPY AND STENT PLACEMENT Left 02/07/2015   Procedure: CYSTOSCOPY WITH LEFT RETROGRADE PYELOGRAM, URETEROSCOPY AND STENT PLACEMENT. REMOVAL OF UROSTOMY TUBE;  Surgeon: [name], MD;  Location: [facility];  Service: Urology;  Laterality: Left;  . CYSTOSCOPY/RETROGRADE/URETEROSCOPY/STONE EXTRACTION WITH BASKET Left 02/28/2015   Procedure: CYSTOSCOPY/RETROGRADE/URETEROSCOPY/STONE EXTRACTION WITH BASKET;  Surgeon: [name], MD;  Location[facility];  Service: Urology;  Laterality: Left;  . HOLMIUM LASER APPLICATION Left [id number]   Procedure: WITH HOLMIUM LASER ;  Surgeon: [name], MD;  Location: [facility];  Service: Urology;  Laterality: Left;  . NEGATIVE SLEEP STUDY  2013  . OVARIAN CYST REMOVAL  age 59    OB History    No data available       Home Medications    Prior to Admission medications   Medication Sig Start Date End Date Taking? Authorizing Provider  aspirin EC 81 MG tablet Take 81 mg by mouth daily.    Historical Provider, MD  atorvastatin (LIPITOR) 40 MG tablet Take 40 mg by mouth daily. 05/17/14   Historical Provider, MD  carbidopa-levodopa (SINEMET IR) 25-100 MG per tablet Take 2 tablets by mouth 3 (three) times daily.     Historical Provider, MD  clonazePAM (KLONOPIN) 0.5 MG tablet Take 0.5 mg by mouth at bedtime.    Historical Provider, MD  Multiple Vitamins-Minerals (MULTIVITAMINS THER. W/MINERALS) TABS Take 1 tablet by mouth daily.    Historical Provider, MD  Omega-3 Fatty Acids (FISH OIL) 1000 MG CAPS Take 1,000 mg by mouth 2 (two) times daily.    Historical Provider, MD  Probiotic Product (PROBIOTIC DAILY) CAPS Take 1 capsule by mouth daily.    Historical Provider, MD    Family History History reviewed. No pertinent family history.  Social History Social History  Substance Use Topics  . Smoking status: Never Smoker  . Smokeless tobacco: Never Used  . Alcohol use Yes     Comment: RARE     Allergies   Patient has no known allergies.   Review of Systems Review of Systems Unable to review systems secondary to dementia.   Physical Exam Updated Vital Signs BP 180/81   Pulse 94   Temp 98 F (36.7 C) (Oral)   Resp (!) 30   Ht 5' 1.5" (1.562 m)   Wt 43.5 kg   SpO2 100%   BMI 17.85 kg/m   Physical Exam  Constitutional: She is oriented to person, place, and time. She appears well-developed and well-nourished. No distress.  HENT:  Head: Normocephalic and atraumatic.  5 cm post scalp laceration, bleeding controlled  Eyes: Conjunctivae are normal. No scleral icterus.  Neck: Normal range of motion.  Cardiovascular: Normal rate, regular rhythm and normal heart sounds.  Exam reveals no gallop and no friction rub.   No murmur heard. Pulmonary/Chest: Effort normal and breath sounds normal. No respiratory distress.  Abdominal: Soft. Bowel sounds are normal. She exhibits no distension and no mass. There is no guarding.  Neurological: She is alert and oriented to person, place, and time.  Skin: Skin is warm and dry. She is not diaphoretic.  Psychiatric:  Very anxious   Nursing note and vitals reviewed.    ED Treatments / Results  Labs (all labs ordered are listed, but only abnormal  results are displayed) Labs Reviewed  URINALYSIS, ROUTINE W REFLEX MICROSCOPIC    EKG  EKG Interpretation None       Radiology No results found.  Procedures .Marland KitchenLaceration Repair Date/Time: 07/13/2016 3:59 PM Performed by: Margarita Mail Authorized by: Margarita Mail   Consent:    Consent obtained:  Verbal   Consent given by:  Patient and spouse   Risks discussed:  Pain Anesthesia (see MAR for exact dosages):    Anesthesia method:  Local infiltration   Local anesthetic:  Lidocaine 1% WITH epi Laceration details:    Location:  Scalp   Scalp location:  Occipital   Length (cm):  5   Laceration depth: superficial. Repair type:    Repair type:  Simple Pre-procedure details:    Preparation:  Patient was prepped and draped in usual sterile  fashion Exploration:    Wound exploration: wound explored through full range of motion     Contaminated: no   Treatment:    Area cleansed with:  Saline   Amount of cleaning:  Standard   Irrigation solution:  Sterile saline   Irrigation method:  Pressure wash   Visualized foreign bodies/material removed: no   Skin repair:    Repair method:  Staples   Number of staples:  6 Approximation:    Approximation:  Close   Vermilion border: well-aligned     (including critical care time)  Medications Ordered in ED Medications - No data to display   Initial Impression / Assessment and Plan / ED Course  I have reviewed the triage vital signs and the nursing notes.  Pertinent labs & imaging results that were available during my care of the patient were reviewed by me and considered in my medical decision making (see chart for details).  Clinical Course      patient with CT head and CT abdomen pending.  Laceration repaired  Awaiting labs and imaging. I have given sign out to Grady who will assume care for discharge.  Final Clinical Impressions(s) / ED Diagnoses   Final diagnoses:  Laceration of scalp without foreign body, initial  encounter    New Prescriptions New Prescriptions   No medications on file     Margarita Mail, PA-C 07/14/16 0836    Isla Pence, MD 07/15/16 [phone]

## 2016-07-13 NOTE — ED Provider Notes (Signed)
Becky Rogers is a 66 y.o. female, with a history of Parkinson's with mild dementia, presenting to the ED with an unwitnessed fall and urinary frequency.  HPI from Becky Mail, PA-C: "Becky Rogers is a 66 y.o. female who presents to the ED with cc of urinary frequency and head laceration. Patient has a hx of parkinsons and mild dementia. She c/o urgency and frequency of urination but has been unable to make much urine.  Her husband states that this morning she had an unwitnessed fall and has a laceration to the back of her head. She has been otherwise acting her baseline. No fevers."  Past Medical History:  Diagnosis Date  . Anxiety   . Erythema   . History of Clostridium difficile    dx 02-07-2015--  resolved  . History of septic shock   . Hyperlipidemia   . Hypertension    CURRENTLY NOT TAKING MED SECONDARY TO HYPOTENTION FROM SEPSIS--  MONITORED BY PCP  . Ischemic finger    erythemic of fingers secondary to septic shock 12-17-2014--  HEALING  . 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 Keratosis seborrheica   . Left ureteral stone   . Mild dementia   . Parkinson disease ([place])    Results for orders placed or performed during the hospital encounter of 07/13/16  Urinalysis, Routine w reflex microscopic- may I&O cath if menses  Result Value Ref Range   Color, Urine STRAW (A) YELLOW   APPearance CLEAR CLEAR   Specific Gravity, Urine 1.005 1.005 - 1.030   pH 6.0 5.0 - 8.0   Glucose, UA NEGATIVE NEGATIVE mg/dL   Hgb urine dipstick NEGATIVE NEGATIVE   Bilirubin Urine NEGATIVE NEGATIVE   Ketones, ur NEGATIVE NEGATIVE mg/dL   Protein, ur NEGATIVE NEGATIVE mg/dL   Nitrite NEGATIVE NEGATIVE   Leukocytes, UA NEGATIVE NEGATIVE  I-stat chem 8, ed  Result Value Ref Range   Sodium 144 135 - 145 mmol/L   Potassium 3.5 3.5 - 5.1 mmol/L   Chloride 112 (H) 101 - 111 mmol/L   BUN 22 (H) 6 - 20 mg/dL   Creatinine, Ser 0.80 0.44 -  1.00 mg/dL   Glucose, Bld 100 (H) 65 - 99 mg/dL   Calcium, Ion 1.15 1.15 - 1.40 mmol/L   TCO2 21 0 - 100 mmol/L   Hemoglobin 13.9 12.0 - 15.0 g/dL   HCT 41.0 36.0 - 46.0 %   Ct Head Wo Contrast  Result Date: 07/13/2016 CLINICAL DATA:  66 y/o F; status post fall hitting back of head on a counter with laceration to the back of the head. History of Parkinson's dementia. EXAM: CT HEAD WITHOUT CONTRAST TECHNIQUE: Contiguous axial images were obtained from the base of the skull through the vertex without intravenous contrast. COMPARISON:  01/12/2015 CT head. FINDINGS: Brain: No evidence of acute infarction, hemorrhage, hydrocephalus, extra-axial collection or mass lesion/mass effect. Few foci of hypoattenuation within subcortical white matter are compatible with mild chronic microvascular ischemic changes. There is mild brain parenchymal volume loss that has progressed from 2016. Vascular: No hyperdense vessel. Mild calcific atherosclerosis of cavernous internal carotid arteries. Skull: Small left parietal scalp contusion and skin staples. No displaced calvarial fracture. Sinuses/Orbits: Minimal anterior ethmoid sinus mucosal thickening. Visualized paranasal sinuses and mastoid air cells are otherwise normally aerated. Orbits are unremarkable. Other: None. IMPRESSION: 1. Small left parietal scalp contusion and skin staples. No displaced calvarial fracture. 2. No acute  intracranial abnormality. 3. Stable mild chronic microvascular ischemic changes and mildly progressed brain parenchymal volume loss from 2016. Electronically Signed   By: Kristine Garbe M.D.   On: 07/13/2016 16:11   Ct Renal Stone Study  Result Date: 07/13/2016 CLINICAL DATA:  Urinary frequency EXAM: CT ABDOMEN AND PELVIS WITHOUT CONTRAST TECHNIQUE: Multidetector CT imaging of the abdomen and pelvis was performed following the standard protocol without IV contrast. COMPARISON:  12/29/2015 FINDINGS: Lower chest: Lung bases shows no acute  abnormality. Hepatobiliary: Unenhanced liver shows no biliary ductal dilatation. No calcified gallstones are noted within gallbladder Pancreas: Unenhanced pancreas with normal appearance. Spleen: Normal unenhanced spleen Adrenals/Urinary Tract: No adrenal gland mass. No hydronephrosis or hydroureter. No calcified ureteral calculi. No nephrolithiasis. There is a cyst in midpole of the left kidney posteriorly measures 7.5 mm stable from prior exam. No calcified calculi are noted within urinary bladder Stomach/Bowel: No small bowel obstruction. Low lying cecum. Moderate stool noted within cecum and right colon. No evidence of colitis or diverticulitis. Vascular/Lymphatic: Atherosclerotic calcifications of distal abdominal aorta and iliac arteries. No aortic aneurysm. No adenopathy. Reproductive: The uterus is atrophic.  No adnexal mass. Other: No ascites or free abdominal air.  No inguinal adenopathy. Musculoskeletal: No destructive bony lesions are noted. Sagittal images of the spine shows mild degenerative changes. Degenerative changes bilateral SI joints. IMPRESSION: 1. There is no nephrolithiasis.  No hydronephrosis or hydroureter. 2. No calcified ureteral calculi. 3. No small bowel obstruction. 4. No ascites or free abdominal air. No calcified calculi are noted within urinary bladder. Electronically Signed   By: Lahoma Crocker M.D.   On: 07/13/2016 16:11        Took patient care handoff report from [place], PA-C. Plan: Review head CT and CT renal stone study. D/C if no acute abnormality.  MDM:  Patient has no acute abnormalities on head CT or CT renal stone study. No signs of infection on UA. Discharge with close follow-up instructions. Pyridium as needed for urinary discomfort. Patient has adequate GFR for Pyridium use.  Vitals:   07/13/16 1530 07/13/16 1545 07/13/16 1815 07/13/16 1824  BP: 175/84 174/96  174/96  Pulse: 108 100 90   Resp: (!) 36 (!) 33 (!) 30   Temp:      TempSrc:       SpO2: 100% 100% 100%   Weight:      Height:          Becky Bender, PA-C 07/13/16 [place], MD 07/14/16 [phone]

## 2016-07-13 NOTE — ED Notes (Signed)
Papers and prescriptions reviewed. IV removed and patient leaving with husband tonight and excited to leave

## 2016-07-13 NOTE — ED Triage Notes (Signed)
Onset 2-3 days urinary frequency.  Onset this morning between 6-8am pt fell and hit back of head on counter.  Lac noted to back of head.  Pt not sure why she fell.  Has Parkisons dementia.  pts husband noted when he came downstairs at 8am he noticed a bloody rag on counter.

## 2016-07-13 NOTE — ED Notes (Signed)
Pt attempted to void at triage, unable to urinate.

## 2016-07-13 NOTE — Discharge Instructions (Signed)
There were no acute abnormalities on the head CT or abdominal CT today. There were no signs of infection in the urine. Follow up with your PCP and neurologist for further assessment. Return to the ED should symptoms worsen. The pyridium may be used as needed for urinary discomfort.  Scalp wound care: You must wait at least 8 hours after the wound repair to wash the wound. Clean the wound and surrounding area gently with tap water and mild soap. Rinse well and blot dry. Do not scrub the wound, as this may cause the wound edges to come apart. You may shower, but avoid submerging the wound, such as with a bath or swimming. Clean the wound daily to prevent infection. Reapplication of a topical antibiotic ointment, such as Neosporin, will decrease scab formation and reduce any scarring. You may use Tylenol, naproxen, ibuprofen for pain, if you are able to take these medications.  Return to the ED in 5 days for staple removal.  Return to the ED sooner should the wound edges come apart or signs of infection arise, such as spreading redness, puffiness/swelling, pus draining from the wound, severe increase in pain, or any other major issues.

## 2016-08-09 ENCOUNTER — Other Ambulatory Visit: Payer: Self-pay | Admitting: Family Medicine

## 2016-08-09 DIAGNOSIS — Z1231 Encounter for screening mammogram for malignant neoplasm of breast: Secondary | ICD-10-CM

## 2016-09-08 ENCOUNTER — Ambulatory Visit
Admission: RE | Admit: 2016-09-08 | Discharge: 2016-09-08 | Disposition: A | Discharge status: Home / Self Care | Payer: Medicare Other | Source: Ambulatory Visit | Attending: Family Medicine | Admitting: Family Medicine

## 2016-09-08 DIAGNOSIS — Z1231 Encounter for screening mammogram for malignant neoplasm of breast: Secondary | ICD-10-CM

## 2016-09-16 DIAGNOSIS — H524 Presbyopia: Secondary | ICD-10-CM | POA: Insufficient documentation

## 2016-09-16 DIAGNOSIS — H2513 Age-related nuclear cataract, bilateral: Secondary | ICD-10-CM | POA: Insufficient documentation

## 2017-05-18 ENCOUNTER — Encounter (HOSPITAL_COMMUNITY): Payer: Self-pay | Admitting: *Deleted

## 2017-05-18 ENCOUNTER — Emergency Department (HOSPITAL_COMMUNITY): Payer: Medicare Other

## 2017-05-18 ENCOUNTER — Other Ambulatory Visit: Payer: Self-pay

## 2017-05-18 ENCOUNTER — Emergency Department (HOSPITAL_COMMUNITY)
Admission: EM | Admit: 2017-05-18 | Discharge: 2017-05-18 | Disposition: A | Discharge status: Home / Self Care | Payer: Medicare Other | Attending: Emergency Medicine | Admitting: Emergency Medicine

## 2017-05-18 DIAGNOSIS — Y929 Unspecified place or not applicable: Secondary | ICD-10-CM | POA: Insufficient documentation

## 2017-05-18 DIAGNOSIS — Z7982 Long term (current) use of aspirin: Secondary | ICD-10-CM | POA: Diagnosis not present

## 2017-05-18 DIAGNOSIS — R55 Syncope and collapse: Secondary | ICD-10-CM | POA: Insufficient documentation

## 2017-05-18 DIAGNOSIS — Y9389 Activity, other specified: Secondary | ICD-10-CM | POA: Diagnosis not present

## 2017-05-18 DIAGNOSIS — W01198A Fall on same level from slipping, tripping and stumbling with subsequent striking against other object, initial encounter: Secondary | ICD-10-CM | POA: Diagnosis not present

## 2017-05-18 DIAGNOSIS — Z79899 Other long term (current) drug therapy: Secondary | ICD-10-CM | POA: Insufficient documentation

## 2017-05-18 DIAGNOSIS — G20 Parkinson's disease: Secondary | ICD-10-CM | POA: Diagnosis not present

## 2017-05-18 DIAGNOSIS — I10 Essential (primary) hypertension: Secondary | ICD-10-CM | POA: Insufficient documentation

## 2017-05-18 DIAGNOSIS — F0390 Unspecified dementia without behavioral disturbance: Secondary | ICD-10-CM | POA: Insufficient documentation

## 2017-05-18 DIAGNOSIS — Y999 Unspecified external cause status: Secondary | ICD-10-CM | POA: Diagnosis not present

## 2017-05-18 LAB — CBC
HCT: 41.6 % (ref 36.0–46.0)
Hemoglobin: 13.4 g/dL (ref 12.0–15.0)
MCH: 29.3 pg (ref 26.0–34.0)
MCHC: 32.2 g/dL (ref 30.0–36.0)
MCV: 91 fL (ref 78.0–100.0)
PLATELETS: 241 10*3/uL (ref 150–400)
RBC: 4.57 MIL/uL (ref 3.87–5.11)
RDW: 12.6 % (ref 11.5–15.5)
WBC: 6.3 10*3/uL (ref 4.0–10.5)

## 2017-05-18 LAB — BASIC METABOLIC PANEL
Anion gap: 6 (ref 5–15)
BUN: 16 mg/dL (ref 6–20)
CHLORIDE: 108 mmol/L (ref 101–111)
CO2: 25 mmol/L (ref 22–32)
Calcium: 8.7 mg/dL — ABNORMAL LOW (ref 8.9–10.3)
Creatinine, Ser: 0.76 mg/dL (ref 0.44–1.00)
Glucose, Bld: 93 mg/dL (ref 65–99)
Potassium: 4.6 mmol/L (ref 3.5–5.1)
SODIUM: 139 mmol/L (ref 135–145)

## 2017-05-18 LAB — URINALYSIS, ROUTINE W REFLEX MICROSCOPIC
BILIRUBIN URINE: NEGATIVE
GLUCOSE, UA: NEGATIVE mg/dL
HGB URINE DIPSTICK: NEGATIVE
KETONES UR: 5 mg/dL — AB
LEUKOCYTES UA: NEGATIVE
NITRITE: POSITIVE — AB
PH: 5 (ref 5.0–8.0)
Protein, ur: 30 mg/dL — AB
SPECIFIC GRAVITY, URINE: 1.013 (ref 1.005–1.030)

## 2017-05-18 LAB — CBG MONITORING, ED: Glucose-Capillary: 80 mg/dL (ref 65–99)

## 2017-05-18 NOTE — ED Triage Notes (Signed)
Pt was in the kitchen and she just dropped to the floor.  She hit her head on the posterior area against the granite counter.  She had syncopal event and husband witness. Pt has Parkinson's.  Pt denies pain.

## 2017-05-18 NOTE — Discharge Instructions (Signed)
You were seen in the ED for a syncopal episode and a fall.  Your head CT was normal and did not show a bleed.  You were worked up with labs for this and they were within normal range.  Additionally, your EKG showed a normal rhythm however a slower heart rate as we discussed.  There are several reasons you can have these symptoms and we would recommend seeing a cardiologist for further evaluation of this.  We discussed possibly admitting you for this and you opted to see an outpatient cardiologist.  I have included Dr. Lysbeth Penner information for you to schedule an appointment.  If you experience any new or worsening symptoms you will need to be evaluated by a provider.

## 2017-05-18 NOTE — ED Provider Notes (Signed)
[facility] EMERGENCY DEPARTMENT Provider Note   CSN: [id number] Arrival date & time: 05/18/17  1002     History   Chief Complaint Chief Complaint  Patient presents with  . Fall  . Near Syncope   HPI   Becky Rogers is a 66 y.o. female presenting with a fall at home this morning around 9AM.  She states she hit head and lost consciousness. Fall was witnessed by her husband who reports she was standing in the kitchen, seemed quiet and then fell, hitting the back of her head on kitchen floor.  He brought her to a nearby chair, and tried to get her to wake up.  She came around about a minute later and appeared a little confused and out of it.  She was acting normally again after about 10 min. 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  He did not witness any seizure activity and no signs of twitching or jerking movements.  Patient recalls just before the fall her vision darkened.  No sensation of palpitations or heart racing.  No chest pain or shortness of breath.  She had a similar episode last year for which she was seen in the ED after she hit her head on the kitchen counter and had a laceration.  She has a small bump on back of her head but no open wounds today. She has a mild headache.  She is oriented to self and place but not to date - this is not new for her, per husband.  Denies fevers, chills, nausea, vomiting, abdominal pain, no CP, dizziness or weakness.  She has been eating normally however husband reports she does not drink as much water as advised.  Follows with Neurology at [facility] (Dr. Kyra Searles), and was last seen about 1 mo ago.  She is currently on carbidopa/levodopa and donepezil, she is off all her other medications now because her BP is well controlled.  Husband gives her all her medications and there is no suspicion that she may have taken the wrong medications.  No h/o cardiac events in the  past and she does not follow with a cardiologist.    Past Medical History:  Diagnosis Date  . Anxiety   . Erythema   . History of Clostridium difficile    dx 02-07-2015--  resolved  . History of septic shock   . Hyperlipidemia   . Hypertension    CURRENTLY NOT TAKING MED SECONDARY TO HYPOTENTION FROM SEPSIS--  MONITORED BY PCP  . Ischemic finger    erythemic of fingers secondary to septic shock 12-17-2014--  HEALING  . 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 Keratosis seborrheica   . Left ureteral stone   . Mild dementia   . Parkinson disease [facility])    Patient Active Problem List   Diagnosis Date Noted  . Ischemia of toes bilaterally 01/27/2015  . Ischemia of fingers--bilaterally 01/27/2015  . Hypokalemia 01/24/2015  . Edema--BLE 01/24/2015  . Leucocytosis 01/24/2015  . Bacteremia due to Escherichia coli 01/24/2015  . Debility 01/16/2015  . Acute renal failure syndrome ([place])   . E. coli UTI   . Leukocytosis   . 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 Thrombocytopenia ([place])   . Metabolic encephalopathy   . Acute respiratory failure with hypoxia ([place])   .  Parkinson's disease ([place])   . Altered mental status   . Encounter for central line placement   . Pulmonary infiltrates   . Abdominal pain, lower   . Hypoxia   . Pyelonephritis   . Pyonephrosis   . Septic shock ([place]) 01/09/2015  . Abnormal mammogram of right breast 08/28/2014   Past Surgical History:  Procedure Laterality Date  . APPENDECTOMY    . CESAREAN SECTION  x2  . COLONOSCOPY    . NEGATIVE SLEEP STUDY  2013  . OVARIAN CYST REMOVAL  age 89   OB History    No data available     Home Medications    Prior to Admission medications   Medication Sig Start Date End Date Taking? Authorizing Provider  aspirin EC 81 MG tablet Take 81 mg by mouth daily.    [provider]  atorvastatin (LIPITOR) 40 MG tablet Take 40 mg by mouth daily.  05/17/14   [provider]  carbidopa-levodopa (SINEMET IR) 25-100 MG per tablet Take 1.5 tablets by mouth 3 (three) times daily.     [provider]  clonazePAM (KLONOPIN) 0.5 MG tablet Take 2 mg by mouth at bedtime.     [provider]  phenazopyridine (PYRIDIUM) 200 MG tablet Take 1 tablet (200 mg total) by mouth 2 (two) times daily as needed (urinary discomfort). 07/13/16   Joy, Helane Gunther, PA-C   Family History No family history on file.  Social History Social History   Tobacco Use  . Smoking status: Never Smoker  . Smokeless tobacco: Never Used  Substance Use Topics  . Alcohol use: Yes    Comment: RARE  . Drug use: No   Allergies   Patient has no known allergies.  Review of Systems Review of Systems  Constitutional: Negative for appetite change, chills and fever.  Respiratory: Negative for cough, chest tightness, shortness of breath and wheezing.   Cardiovascular: Negative for chest pain, palpitations and leg swelling.  Gastrointestinal: Negative for abdominal pain, diarrhea, nausea and vomiting.  Musculoskeletal: Negative for back pain and neck pain.  Neurological: Negative for weakness.   Physical Exam Updated Vital Signs BP (!) 124/96   Pulse 75   Temp 97.8 F (36.6 C) (Oral)   Resp 20   SpO2 100%   Physical Exam  Gen- 66 yo female, NAD, husband at bedside  Skin - warm, dry, not diaphoretic, no rashes  HEENT - NCAT, small area of swelling on posterior scalp with minimal erythema, no open lesions or bleeding noted, mild TTP noted, PERRL, EOMI, MMM, O/P clear  Neck - supple, no JVD  Chest - CTAB, non-laboured  Heart - RRR, no MRG  Abdomen - soft, NTND, +bs  Musculoskeletal - no edema   Neuro - awake, oriented to self and place only, strength is 5/5 in upper and lower extr bilaterally, normal speech, sensation is intact   ED Treatments / Results  Labs (all labs ordered are listed, but only abnormal results are displayed) Labs Reviewed    BASIC METABOLIC PANEL - Abnormal; Notable for the following components:      Result Value   Calcium 8.7 (*)    All other components within normal limits  URINALYSIS, ROUTINE W REFLEX MICROSCOPIC - Abnormal; Notable for the following components:   APPearance HAZY (*)    Ketones, ur 5 (*)    Protein, ur 30 (*)    Nitrite POSITIVE (*)    Bacteria, UA RARE (*)    Squamous Epithelial /  LPF 0-5 (*)    All other components within normal limits  CBC  CBG MONITORING, ED   EKG  EKG Interpretation  Date/Time:  Wednesday May 18 2017 10:33:48 EST Ventricular Rate:  53 PR Interval:  124 QRS Duration: 76 QT Interval:  420 QTC Calculation: 394 R Axis:   70 Text Interpretation:  Sinus bradycardia Otherwise normal ECG Since prior ECG, rate has slowed, no other significant changes Confirmed by Gareth Morgan [phone]) on 05/18/2017 11:05:27 AM      Radiology Ct Head Wo Contrast  Result Date: 05/18/2017 CLINICAL DATA:  Syncope with fall.  Loss of consciousness with fall EXAM: CT HEAD WITHOUT CONTRAST TECHNIQUE: Contiguous axial images were obtained from the base of the skull through the vertex without intravenous contrast. COMPARISON:  July 13, 2014. FINDINGS: Brain: There is mild diffuse atrophy. There is no intracranial mass, hemorrhage, extra-axial fluid collection, or midline shift. There is slight small vessel disease in the centra semiovale bilaterally. Elsewhere gray-white compartments appear normal. No evident acute infarct. Vascular: No appreciable hyperdense vessel. There is calcification in each carotid siphon region. Skull: Bony calvarium appears intact. Sinuses/Orbits: There is mucosal thickening in several ethmoid air cells. There is leftward deviation of the nasal septum. Visualized paranasal sinuses elsewhere clear. Orbits appear symmetric bilaterally. Other: Mastoid air cells clear. IMPRESSION: Mild atrophy with slight periventricular small vessel disease. No intracranial mass,  hemorrhage, or extra-axial fluid collection. No evident acute appearing infarct. There are foci of arterial vascular calcification. There is mucosal thickening in several ethmoid air cells. There is leftward deviation of the nasal septum. Electronically Signed   By: Lowella Grip III M.D.   On: 05/18/2017 14:30   Procedures Procedures (including critical care time)  Medications Ordered in ED Medications - No data to display  Initial Impression / Assessment and Plan / ED Course  I have reviewed the triage vital signs and the nursing notes.  Pertinent labs & imaging results that were available during my care of the patient were reviewed by me and considered in my medical decision making (see chart for details).  66 yo female presenting with witnessed syncopal episode this morning in which husband reports she hit her head on kitchen floor.  No concern for seizure as event per history, however with mild presyncopal symptoms of darkening vision, and dizziness.   Consider cardiac etiology vs vasovagal.  EKG with sinus bradycardia in the 50's but otherwise unremarkable.  Head CT wo contrast ordered to rule out acute process and was negative.  BMET and CBC unremarkable.  UA positive for nitrite but negative for leuks unlikely infectious in etiology.   Could consider some component of dehydration given she has not been drinking much water.   -discussed admitting for inpatient syncopal workup with patient and her husband -- they have expressed a desire to go home  and follow up as outpatient for workup with cardiology.  Reviewed risks and benefits.   -Return precautions discussed and they expressed good understanding   Final Clinical Impressions(s) / ED Diagnoses   Final diagnoses:  Syncope and collapse   ED Discharge Orders    None     Lovenia Kim, MD [place], PGY-2     Lovenia Kim, MD 05/18/17 [place], [place], MD 05/20/17 1616

## 2017-05-18 NOTE — ED Notes (Signed)
Placed [employer] per Carlis Abbott (RN)

## 2017-06-09 ENCOUNTER — Encounter: Payer: Self-pay | Admitting: *Deleted

## 2017-06-10 ENCOUNTER — Ambulatory Visit (INDEPENDENT_AMBULATORY_CARE_PROVIDER_SITE_OTHER): Payer: Medicare Other | Admitting: Internal Medicine

## 2017-06-10 ENCOUNTER — Encounter: Payer: Self-pay | Admitting: Internal Medicine

## 2017-06-10 VITALS — BP 110/58 | HR 72 | Ht 61.5 in | Wt 97.0 lb

## 2017-06-10 DIAGNOSIS — G20A1 Parkinson's disease without dyskinesia, without mention of fluctuations: Secondary | ICD-10-CM

## 2017-06-10 DIAGNOSIS — R55 Syncope and collapse: Secondary | ICD-10-CM | POA: Diagnosis not present

## 2017-06-10 DIAGNOSIS — I951 Orthostatic hypotension: Secondary | ICD-10-CM | POA: Diagnosis not present

## 2017-06-10 DIAGNOSIS — G20 Parkinson's disease: Secondary | ICD-10-CM | POA: Diagnosis not present

## 2017-06-10 NOTE — Patient Instructions (Addendum)
Your physician has requested that you have an echocardiogram @ [address] - 3rd Floor. Echocardiography is a painless test that uses sound waves to create images of your heart. It provides your doctor with information about the size and shape of your heart and how well your heart's chambers and valves are working. This procedure takes approximately one hour. There are no restrictions for this procedure.  Your physician recommends that you schedule a follow-up appointment as needed with Dr. Debara Pickett.

## 2017-06-13 ENCOUNTER — Encounter: Payer: Self-pay | Admitting: Internal Medicine

## 2017-06-13 DIAGNOSIS — R55 Syncope and collapse: Secondary | ICD-10-CM | POA: Insufficient documentation

## 2017-06-13 DIAGNOSIS — I951 Orthostatic hypotension: Secondary | ICD-10-CM | POA: Insufficient documentation

## 2017-06-13 NOTE — Progress Notes (Signed)
OFFICE NOTE  Chief Complaint:  Syncope  Primary Care Physician: Aletha Halim., PA-C  HPI:  Becky Rogers is a 66 y.o. female whose husband is a patient of mine, and has a past medial history significant for Parkinson's disease, with diagnosis about a year and a half ago.  Unfortunately, she has had not only motor symptoms but also progressive dementia.  According to Mr. Loney Laurence, she has had 2 syncopal episodes.  Recently, she had an episode where she was sitting at the table and fell backwards and hit her head.  He helped her to the floor and there was brief loss of consciousness.  This seems to be of unprovoked.  She was taken to the emergency department and noted to be hypotensive with blood pressures in the 80s over 40s and bradycardic with heart rate in the 50s.  Subsequently heart rate and blood pressure resolved.  She is followed by Dr. Hall Busing, a neurologist at [facility].  She denies any chest pain, worsening shortness of breath and has few cardiac risk factors, but does have hypertension, dyslipidemia and her mother died at age 62 from a "heart related" illness. No significant history of syncope, sudden death or other medical issues related to that in the family.  EKG from the ER on 05/22/2017 was personally reviewed.  This showed sinus bradycardia without ischemic changes.  PMHx:  Past Medical History:  Diagnosis Date  . Anxiety   . Erythema   . History of Clostridium difficile    dx 02-07-2015--  resolved  . History of septic shock   . Hyperlipidemia   . Hypertension    CURRENTLY NOT TAKING MED SECONDARY TO HYPOTENTION FROM SEPSIS--  MONITORED BY PCP  . Ischemic finger    erythemic of fingers secondary to septic shock 12-17-2014--  HEALING  . 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 Keratosis seborrheica   . Left ureteral stone   . Mild dementia   . Parkinson disease [facility])     Past Surgical History:  Procedure  Laterality Date  . APPENDECTOMY    . BREAST LUMPECTOMY WITH RADIOACTIVE SEED LOCALIZATION Right 08/28/2014   Procedure: RIGHT BREAST LUMPECTOMY WITH RADIOACTIVE SEED LOCALIZATION;  Surgeon: [name], MD;  Location: [place];  Service: General;  Laterality: Right;  . CESAREAN SECTION  x2  . COLONOSCOPY    . CYSTOSCOPY W/ URETERAL STENT PLACEMENT Left 02/28/2015   Procedure: CYSTOSCOPY WITH STENT REPLACEMENT;  Surgeon: [name], MD;  Location[facility];  Service: Urology;  Laterality: Left;  . CYSTOSCOPY WITH RETROGRADE PYELOGRAM, URETEROSCOPY AND STENT PLACEMENT Left 02/07/2015   Procedure: CYSTOSCOPY WITH LEFT RETROGRADE PYELOGRAM, URETEROSCOPY AND STENT PLACEMENT. REMOVAL OF UROSTOMY TUBE;  Surgeon: [name], MD;  Location: [facility];  Service: Urology;  Laterality: Left;  . CYSTOSCOPY/RETROGRADE/URETEROSCOPY/STONE EXTRACTION WITH BASKET Left 02/28/2015   Procedure: CYSTOSCOPY/RETROGRADE/URETEROSCOPY/STONE EXTRACTION WITH BASKET;  Surgeon: [name], MD;  [facility];  Service: Urology;  Laterality: Left;  . HOLMIUM LASER APPLICATION Left 7/62/8315   Procedure: WITH HOLMIUM LASER ;  Surgeon: [name], MD;  Location: [facility];  Service: Urology;  Laterality: Left;  . NEGATIVE SLEEP STUDY  2013  . OVARIAN CYST REMOVAL  age 9    FAMHx:  Family History  Problem Relation Age of Onset  . Heart disease Mother     SOCHx:   reports that  has never smoked. she has never used smokeless tobacco. She reports that she drinks alcohol. She reports that she does not use drugs.  ALLERGIES:  No Known Allergies  ROS: Pertinent items noted in HPI and remainder of comprehensive ROS otherwise negative.  HOME MEDS: Current Outpatient Medications on File Prior to Visit  Medication Sig Dispense Refill  . carbidopa-levodopa (SINEMET IR) 25-100 MG per tablet Take 1.5 tablets by mouth 3 (three) times daily.      Marland Kitchen aspirin EC 81 MG tablet Take 81 mg by mouth daily.    Marland Kitchen atorvastatin (LIPITOR) 40 MG tablet Take 40 mg by mouth daily.    . clonazePAM (KLONOPIN) 0.5 MG tablet Take 2 mg by mouth at bedtime.     . phenazopyridine (PYRIDIUM) 200 MG tablet Take 1 tablet (200 mg total) by mouth 2 (two) times daily as needed (urinary discomfort). (Patient not taking: Reported on 06/10/2017) 10 tablet 0   No current facility-administered medications on file prior to visit.     LABS/IMAGING: No results found for this or any previous visit (from the past 48 hour(s)). No results found.  LIPID PANEL: No results found for: CHOL, TRIG, HDL, CHOLHDL, VLDL, LDLCALC, LDLDIRECT   WEIGHTS: Wt Readings from Last 3 Encounters:  06/10/17 97 lb (44 kg)  07/13/16 96 lb (43.5 kg)  02/28/15 100 lb (45.4 kg)    VITALS: BP (!) 110/58   Pulse 72   Ht 5' 1.5" (1.562 m)   Wt 97 lb (44 kg)   SpO2 98%   BMI 18.03 kg/m   EXAM: General appearance: alert, no distress and Noted to have motor restlessness and tremor Neck: no carotid bruit, no JVD and thyroid not enlarged, symmetric, no tenderness/mass/nodules Lungs: clear to auscultation bilaterally Heart: regular rate and rhythm Abdomen: soft, non-tender; bowel sounds normal; no masses,  no organomegaly Extremities: extremities normal, atraumatic, no cyanosis or edema Pulses: 2+ and symmetric Skin: Skin color, texture, turgor normal. No rashes or lesions Neurologic: Grossly normal Psych: Flat affect  EKG: Sinus bradycardia, no ischemia (hospital EKG)- personally reviewed  ASSESSMENT: 1. 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 Progressive dementia  PLAN: 1.   Unfortunately, Becky Rogers has Parkinson's with progressive dementia and I suspect dysautonomia.  She has had 2 syncopal episodes.  The most recent demonstrated hypotension and bradycardia which resolved.  This is likely due to autonomic orthostatic  hypotension.  There was no evidence of orthostasis in the office today, and in general she has a normal blood pressure.  During her first episode she was on blood pressure medication and that was discontinued.  She is likely to have more episodes going forward.  We discussed conservative management of this including the benefit of thigh-high bilateral 20-30 mm compression stockings, regular hydration and liberalizing salt in her diet.  If she has persistent hypotension and recurrent events, she may benefit from adding Northera.  I will obtain an echocardiogram just to rule out any cardiomyopathy that could have been associated with an arrhythmia or other etiology of her syncope, but I suspect this will look fairly normal.  I advised him to follow-up with me if she has further episodes of syncope and we could consider starting Northera.  Thanks for the opportunity to participate in her care.  Pixie Casino, MD, [facility], [place] Director of the [facility]  [facility] Attending Cardiologist  Direct Dial: [phone]  Fax: [phone]  Website:  [email or web address] 06/13/2017, 2:42 PM

## 2017-06-15 ENCOUNTER — Other Ambulatory Visit: Payer: Self-pay

## 2017-06-15 ENCOUNTER — Ambulatory Visit (HOSPITAL_COMMUNITY): Payer: Medicare Other | Attending: Cardiology

## 2017-06-15 DIAGNOSIS — E785 Hyperlipidemia, unspecified: Secondary | ICD-10-CM | POA: Diagnosis not present

## 2017-06-15 DIAGNOSIS — I10 Essential (primary) hypertension: Secondary | ICD-10-CM | POA: Diagnosis not present

## 2017-06-15 DIAGNOSIS — R55 Syncope and collapse: Secondary | ICD-10-CM | POA: Diagnosis present

## 2017-06-15 DIAGNOSIS — F0390 Unspecified dementia without behavioral disturbance: Secondary | ICD-10-CM | POA: Diagnosis not present

## 2017-08-11 ENCOUNTER — Other Ambulatory Visit: Payer: Self-pay | Admitting: Family Medicine

## 2017-08-11 DIAGNOSIS — Z1231 Encounter for screening mammogram for malignant neoplasm of breast: Secondary | ICD-10-CM

## 2017-09-06 DIAGNOSIS — F28 Other psychotic disorder not due to a substance or known physiological condition: Secondary | ICD-10-CM | POA: Insufficient documentation

## 2017-09-09 ENCOUNTER — Other Ambulatory Visit: Payer: Self-pay | Admitting: Family Medicine

## 2017-09-09 ENCOUNTER — Ambulatory Visit
Admission: RE | Admit: 2017-09-09 | Discharge: 2017-09-09 | Disposition: A | Discharge status: Home / Self Care | Payer: Medicare Other | Source: Ambulatory Visit | Attending: Family Medicine | Admitting: Family Medicine

## 2017-09-09 DIAGNOSIS — Z1231 Encounter for screening mammogram for malignant neoplasm of breast: Secondary | ICD-10-CM

## 2018-04-28 ENCOUNTER — Encounter (HOSPITAL_COMMUNITY): Payer: Self-pay

## 2018-04-28 ENCOUNTER — Emergency Department (HOSPITAL_COMMUNITY): Payer: Medicare Other

## 2018-04-28 ENCOUNTER — Emergency Department (HOSPITAL_COMMUNITY)
Admission: EM | Admit: 2018-04-28 | Discharge: 2018-04-28 | Disposition: A | Discharge status: Home / Self Care | Payer: Medicare Other | Attending: Emergency Medicine | Admitting: Emergency Medicine

## 2018-04-28 ENCOUNTER — Other Ambulatory Visit: Payer: Self-pay

## 2018-04-28 DIAGNOSIS — F0390 Unspecified dementia without behavioral disturbance: Secondary | ICD-10-CM | POA: Diagnosis not present

## 2018-04-28 DIAGNOSIS — R55 Syncope and collapse: Secondary | ICD-10-CM | POA: Insufficient documentation

## 2018-04-28 DIAGNOSIS — I10 Essential (primary) hypertension: Secondary | ICD-10-CM | POA: Insufficient documentation

## 2018-04-28 LAB — CBC
HCT: 36.7 % (ref 36.0–46.0)
Hemoglobin: 10.9 g/dL — ABNORMAL LOW (ref 12.0–15.0)
MCH: 24.5 pg — AB (ref 26.0–34.0)
MCHC: 29.7 g/dL — ABNORMAL LOW (ref 30.0–36.0)
MCV: 82.7 fL (ref 80.0–100.0)
NRBC: 0 % (ref 0.0–0.2)
PLATELETS: 291 10*3/uL (ref 150–400)
RBC: 4.44 MIL/uL (ref 3.87–5.11)
RDW: 14.1 % (ref 11.5–15.5)
WBC: 7.5 10*3/uL (ref 4.0–10.5)

## 2018-04-28 LAB — BASIC METABOLIC PANEL
Anion gap: 6 (ref 5–15)
BUN: 20 mg/dL (ref 8–23)
CALCIUM: 8.6 mg/dL — AB (ref 8.9–10.3)
CO2: 25 mmol/L (ref 22–32)
Chloride: 110 mmol/L (ref 98–111)
Creatinine, Ser: 0.73 mg/dL (ref 0.44–1.00)
Glucose, Bld: 84 mg/dL (ref 70–99)
Potassium: 4.3 mmol/L (ref 3.5–5.1)
Sodium: 141 mmol/L (ref 135–145)

## 2018-04-28 LAB — URINALYSIS, ROUTINE W REFLEX MICROSCOPIC
Bilirubin Urine: NEGATIVE
Glucose, UA: NEGATIVE mg/dL
HGB URINE DIPSTICK: NEGATIVE
KETONES UR: 5 mg/dL — AB
LEUKOCYTES UA: NEGATIVE
Nitrite: NEGATIVE
PROTEIN: NEGATIVE mg/dL
Specific Gravity, Urine: 1.015 (ref 1.005–1.030)
pH: 6 (ref 5.0–8.0)

## 2018-04-28 LAB — CBG MONITORING, ED: GLUCOSE-CAPILLARY: 76 mg/dL (ref 70–99)

## 2018-04-28 LAB — TROPONIN I: Troponin I: <0.03 ng/mL (ref <0.03)

## 2018-04-28 MED ORDER — SODIUM CHLORIDE 0.9 % IV BOLUS
500.0000 mL | Freq: Once | INTRAVENOUS | Status: AC
  Administered 2018-04-28: 500 mL via INTRAVENOUS

## 2018-04-28 NOTE — ED Triage Notes (Addendum)
Patient arrived after having syncope x 3 this pat week. Family reports that the events happen around same time, when she is preparing breakfast. This am diaphoretic and found hypotensive following each event. Patient has parkinson's disease and movement on arival making it difficult for EKG Increased movements and jerking in evening per family

## 2018-04-28 NOTE — ED Notes (Signed)
CBG 76 

## 2018-04-28 NOTE — ED Provider Notes (Signed)
[facility] Provider Note   CSN: [id number] Arrival date & time: 04/28/18  1419     History   Chief Complaint No chief complaint on file.   HPI Becky Rogers is a 67 y.o. female. Level 5 caveat due to dementia. HPI Patient presents after syncopal episodes.  Has had 3 episodes this week.  Has had 2 prior episodes also.  All occur in the morning about an hour after taking her morning medicines.  Felt lightheaded and passed out.  This morning also became pale and diaphoretic.  Has had low blood pressure at home each time.  Is a history of Parkinson's disease versus Lewy body dementia.  She takes her Sinemet at 8 in the morning.  Has had overall good appetite.  Mental status however has not been as good over the last couple days.  Husband states is really not all that out of line for her.  Has had a decent oral intake.  She is on no blood pressure medicines.  Has had no fevers.  On these previous episodes this week after somewhere between 10 and 45 minutes she will be feeling better and back to normal. Past Medical History:  Diagnosis Date  . Anxiety   . Erythema   . History of Clostridium difficile    dx 02-07-2015--  resolved  . History of septic shock   . Hyperlipidemia   . Hypertension    CURRENTLY NOT TAKING MED SECONDARY TO HYPOTENTION FROM SEPSIS--  MONITORED BY PCP  . Ischemic finger    erythemic of fingers secondary to septic shock 12-17-2014--  HEALING  . 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 Keratosis seborrheica   . Left ureteral stone   . Mild dementia ([employer])   . Parkinson disease [facility])     Patient Active Problem List   Diagnosis Date Noted  . Syncope 06/13/2017  . 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 Age-related nuclear cataract of both eyes 09/16/2016  . Presbyopia of both eyes 09/16/2016  . Diarrhea of presumed infectious origin 09/12/2015  .  Hyperlipidemia 09/12/2015  . Sepsis ([place]) 09/12/2015  . Anxiety 02/04/2015  . Injury of finger, superficial, infected 02/04/2015  . Injury of foot, left 02/04/2015  . Keratosis, seborrheic 02/04/2015  . Memory loss 02/04/2015  . Neoplasm of uncertain behavior of skin of face 02/04/2015  . Tremor 02/04/2015  . Undiagnosed cardiac murmurs 02/04/2015  . Fracture of fifth toe, left, closed 02/03/2015  . Ischemia of toes bilaterally 01/27/2015  . Ischemia of fingers--bilaterally 01/27/2015  . Hypokalemia 01/24/2015  . Edema--BLE 01/24/2015  . Leucocytosis 01/24/2015  . Bacteremia due to Escherichia coli 01/24/2015  . Debility 01/16/2015  . Acute renal failure syndrome ([place])   . E. coli UTI   . Leukocytosis   . 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 Thrombocytopenia ([place])   . Metabolic encephalopathy   . Acute respiratory failure with hypoxia ([place])   . Parkinson's disease ([place])   . Altered mental status   . Encounter for central line placement   . Pulmonary infiltrates   . Abdominal pain, lower   . Hypoxia   . Pyelonephritis   . Pyonephrosis   . Septic shock ([place]) 01/09/2015  . Essential hypertension 10/04/2014  . Abnormal mammogram of right breast 08/28/2014  . Mild cognitive impairment 05/23/2014  . Parkinsonian features 05/23/2014  . 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 APPENDECTOMY    . BREAST EXCISIONAL BIOPSY Right    CS[facility]  . BREAST LUMPECTOMY WITH RADIOACTIVE SEED LOCALIZATION Right 08/28/2014   Procedure: RIGHT BREAST LUMPECTOMY WITH RADIOACTIVE SEED LOCALIZATION;  Surgeon: [name], MD;  Location: [facility];  Service: General;  Laterality: Right;  . CESAREAN SECTION  x2  . COLONOSCOPY    . CYSTOSCOPY W/ URETERAL STENT PLACEMENT Left 02/28/2015   Procedure: CYSTOSCOPY WITH STENT REPLACEMENT;  Surgeon: [name], MD;  Location: [facility];  Service: Urology;   Laterality: Left;  . CYSTOSCOPY WITH RETROGRADE PYELOGRAM, URETEROSCOPY AND STENT PLACEMENT Left 02/07/2015   Procedure: CYSTOSCOPY WITH LEFT RETROGRADE PYELOGRAM, URETEROSCOPY AND STENT PLACEMENT. REMOVAL OF UROSTOMY TUBE;  Surgeon: [name], MD;  Location: [facility];  Service: Urology;  Laterality: Left;  . CYSTOSCOPY/RETROGRADE/URETEROSCOPY/STONE EXTRACTION WITH BASKET Left 02/28/2015   Procedure: CYSTOSCOPY/RETROGRADE/URETEROSCOPY/STONE EXTRACTION WITH BASKET;  Surgeon: [name], MD;  [facility];  Service: Urology;  Laterality: Left;  . HOLMIUM LASER APPLICATION Left 01/15/2375   Procedure: WITH HOLMIUM LASER ;  Surgeon: [name], MD;  Location: [facility];  Service: Urology;  Laterality: Left;  . NEGATIVE SLEEP STUDY  2013  . OVARIAN CYST REMOVAL  age 63     OB History   None      Home Medications    Prior to Admission medications   Medication Sig Start Date End Date Taking? Authorizing Provider  aspirin EC 81 MG tablet Take 81 mg by mouth daily.   Yes [provider]  carbidopa-levodopa (SINEMET IR) 25-100 MG per tablet Take 1.5 tablets by mouth See admin instructions. Take 1.5 tablets by mouth SIX times a day- 8 AM, 11 AM, 2 PM, 5 PM, 8 PM, and 11 PM (or bedtime)   Yes [provider]  donepezil (ARICEPT) 5 MG tablet Take 10 mg by mouth at bedtime.  03/27/18  Yes [provider]  MYRBETRIQ 25 MG TB24 tablet Take 25 mg by mouth at bedtime. 04/23/18  Yes [provider]  NONFORMULARY OR COMPOUNDED ITEM Place 1 application vaginally See admin instructions. Estradiol 0.1 mg pluronic gel: Place fingertip-amount in the vagina nightly X 4 weeks, then every other night 03/27/18  Yes [provider]  Polyethylene Glycol 3350 (PEG 3350) POWD Take 17 g by mouth daily as needed (for constipation).  12/19/17  Yes [provider]  phenazopyridine (PYRIDIUM) 200 MG tablet Take 1 tablet (200 mg  total) by mouth 2 (two) times daily as needed (urinary discomfort). Patient not taking: Reported on 04/28/2018 07/13/16   Lorayne Bender, PA-C    Family History Family History  Problem Relation Age of Onset  . Heart disease Mother     Social History Social History   Tobacco Use  . Smoking status: Never Smoker  . Smokeless tobacco: Never Used  Substance Use Topics  . Alcohol use: Yes    Comment: RARE  . Drug use: No     Allergies   Patient has no known allergies.   Review of Systems Review of Systems  Unable to perform ROS: Dementia     Physical Exam Updated Vital Signs BP (!) 126/57 (BP Location: Left Arm)   Pulse 69   Temp 98.6 F (37 C) (Oral)   Resp 19   SpO2 96%   Physical Exam  Constitutional: She appears well-developed.  HENT:  Head: Atraumatic.  Eyes: Right eye exhibits no discharge. Left eye exhibits no discharge.  Cardiovascular: Normal rate.  Pulmonary/Chest: Effort normal.  Abdominal: There is no tenderness.  Musculoskeletal: She exhibits no edema or tenderness.  Neurological: She is alert.  Patient with some confusion.  Some difficulty speaking due to movement issues also.  Continuous movements of all her extremities.  Skin: Skin is warm. Capillary refill takes less than 2 seconds.     ED Treatments / Results  Labs (all labs ordered are listed, but only abnormal results are displayed) Labs Reviewed  BASIC METABOLIC PANEL - Abnormal; Notable for the following components:      Result Value   Calcium 8.6 (*)    All other components within normal limits  CBC - Abnormal; Notable for the following components:   Hemoglobin 10.9 (*)    MCH 24.5 (*)    MCHC 29.7 (*)    All other components within normal limits  URINALYSIS, ROUTINE W REFLEX MICROSCOPIC - Abnormal; Notable for the following components:   Ketones, ur 5 (*)    All other components within normal limits  TROPONIN I  CBG MONITORING, ED  POC OCCULT BLOOD, ED    EKG EKG  Interpretation  Date/Time:  Friday April 28 2018 14:22:09 EDT Ventricular Rate:  78 PR Interval:  122 QRS Duration: 78 QT Interval:  380 QTC Calculation: 433 R Axis:   69 Text Interpretation:  Sinus rhythm with Premature atrial complexes with Abberant conduction Otherwise normal ECG artifact nn multiple leads Confirmed by Davonna Belling [id number]) on 04/28/2018 3:03:28 PM   Radiology Dg Chest Portable 1 View  Result Date: 04/28/2018 CLINICAL DATA:  Syncope. EXAM: PORTABLE CHEST 1 VIEW COMPARISON:  Chest x-ray 01/15/2015. FINDINGS: Mediastinum hilar structures normal. Lungs are clear. No pleural effusion or pneumothorax. Diffuse osteopenia. Degenerative change thoracic spine. No acute bony abnormality. IMPRESSION: No active disease. Electronically Signed   By: Marcello Moores  Register   On: 04/28/2018 15:49    Procedures Procedures (including critical care time)  Medications Ordered in ED Medications  sodium chloride 0.9 % bolus 500 mL (0 mLs Intravenous Stopped 04/28/18 1654)     Initial Impression / Assessment and Plan / ED Course  I have reviewed the triage vital signs and the nursing notes.  Pertinent labs & imaging results that were available during my care of the patient were reviewed by me and considered in my medical decision making (see chart for details).     Patient with recurrent syncope.  Work-up reassuring but does have a mild anemia.  Easily could be due to autonomic dysfunction from her Parkinson's versus Lewy body.  Work-up overall reassuring.  Has not had black stools.  Discussed with patient and husband and will defer rectal exam at this time.  We will follow-up with her primary and her neurologist  Final Clinical Impressions(s) / ED Diagnoses   Final diagnoses:  Syncope, unspecified syncope type    ED Discharge Orders    None       Davonna Belling, MD 04/28/18 1933

## 2018-04-28 NOTE — Discharge Instructions (Addendum)
Follow-up with your neurologist about the recurrent syncope/lightheadedness per

## 2018-07-20 ENCOUNTER — Encounter: Payer: Self-pay | Admitting: Podiatry

## 2018-07-20 ENCOUNTER — Ambulatory Visit (INDEPENDENT_AMBULATORY_CARE_PROVIDER_SITE_OTHER): Payer: Medicare Other | Admitting: Podiatry

## 2018-07-20 VITALS — BP 151/79

## 2018-07-20 DIAGNOSIS — M79674 Pain in right toe(s): Secondary | ICD-10-CM | POA: Diagnosis not present

## 2018-07-20 DIAGNOSIS — M79675 Pain in left toe(s): Secondary | ICD-10-CM | POA: Diagnosis not present

## 2018-07-20 DIAGNOSIS — B351 Tinea unguium: Secondary | ICD-10-CM

## 2018-07-20 NOTE — Progress Notes (Signed)
Subjective: Becky Rogers presents today referred by Aletha Halim., PA-C with cc of painful, discolored, thick toenails which interfere with daily activities.  Pain is aggravated when wearing enclosed shoe gear.   She is accompanied by her caregiver who states family was hesitant about cutting her toenails due to thickness.  Past Medical History:  Diagnosis Date  . Anxiety   . Erythema   . History of Clostridium difficile    dx 02-07-2015--  resolved  . History of septic shock   . Hyperlipidemia   . Hypertension    CURRENTLY NOT TAKING MED SECONDARY TO HYPOTENTION FROM SEPSIS--  MONITORED BY PCP  . Ischemic finger    erythemic of fingers secondary to septic shock 12-17-2014--  HEALING  . 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 Keratosis seborrheica   . Left ureteral stone   . Mild dementia ([place])   . Parkinson disease [facility])      Patient Active Problem List   Diagnosis Date Noted  . 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[place]) 09/06/2017  . Syncope 06/13/2017  . 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 Age-related nuclear cataract of both eyes 09/16/2016  . Presbyopia of both eyes 09/16/2016  . Diarrhea of presumed infectious origin 09/12/2015  . Hyperlipidemia 09/12/2015  . Sepsis ([place]) 09/12/2015  . Anxiety 02/04/2015  . Injury of finger, superficial, infected 02/04/2015  . Injury of foot, left 02/04/2015  . Keratosis, seborrheic 02/04/2015  . Memory loss 02/04/2015  . Neoplasm of uncertain behavior of skin of face 02/04/2015  . Tremor 02/04/2015  . Undiagnosed cardiac murmurs 02/04/2015  . Fracture of fifth toe, left, closed 02/03/2015  . Ischemia of toes bilaterally 01/27/2015  . Ischemia of fingers--bilaterally 01/27/2015  . Hypokalemia 01/24/2015  . Edema--BLE 01/24/2015  . Leucocytosis 01/24/2015  . Bacteremia due to Escherichia coli 01/24/2015  . Debility 01/16/2015  . Acute renal failure syndrome ([place])   . E. coli  UTI   . Leukocytosis   . 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 Thrombocytopenia ([place])   . Metabolic encephalopathy   . Acute respiratory failure with hypoxia ([place])   . Parkinson's disease ([place])   . Altered mental status   . Encounter for central line placement   . Pulmonary infiltrates   . Abdominal pain, lower   . Hypoxia   . Pyelonephritis   . Pyonephrosis   . Septic shock ([place]) 01/09/2015  . Essential hypertension 10/04/2014  . Abnormal mammogram of right breast 08/28/2014  . Mild cognitive impairment 05/23/2014  . Parkinsonian features 05/23/2014  . 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 APPENDECTOMY    . BREAST EXCISIONAL BIOPSY Right    CSL  . BREAST LUMPECTOMY WITH RADIOACTIVE SEED LOCALIZATION Right 08/28/2014   Procedure: RIGHT BREAST LUMPECTOMY WITH RADIOACTIVE SEED LOCALIZATION;  Surgeon: [name], MD;  Location: [facility];  Service: General;  Laterality: Right;  . CESAREAN SECTION  x2  . COLONOSCOPY    . CYSTOSCOPY W/ URETERAL STENT PLACEMENT Left 02/28/2015   Procedure: CYSTOSCOPY WITH STENT REPLACEMENT;  Surgeon: [name], MD;  Location: [facility];  Service: Urology;  Laterality: Left;  . CYSTOSCOPY WITH RETROGRADE PYELOGRAM, URETEROSCOPY AND STENT PLACEMENT Left 02/07/2015   Procedure: CYSTOSCOPY WITH LEFT RETROGRADE PYELOGRAM, URETEROSCOPY AND STENT PLACEMENT. REMOVAL OF UROSTOMY TUBE;  Surgeon: [name], MD;  [facility];  Service: Urology;  Laterality: Left;  . CYSTOSCOPY/RETROGRADE/URETEROSCOPY/STONE EXTRACTION WITH BASKET Left 02/28/2015   Procedure: CYSTOSCOPY/RETROGRADE/URETEROSCOPY/STONE EXTRACTION WITH BASKET;  Surgeon: [name], MD;  Location[facility];  Service: Urology;  Laterality: Left;  . HOLMIUM LASER APPLICATION Left 3/41/9379   Procedure: WITH HOLMIUM LASER ;  Surgeon: [name], MD;  Location: [facility];  Service: Urology;  Laterality: Left;  . NEGATIVE SLEEP STUDY  2013  . OVARIAN CYST REMOVAL  age 2      Current Outpatient Medications:  .  amoxicillin (AMOXIL) 500 MG capsule, , Disp: , Rfl:  .  aspirin EC 81 MG tablet, Take 81 mg by mouth daily., Disp: , Rfl:  .  carbidopa-levodopa (SINEMET IR) 25-100 MG per tablet, Take 1.5 tablets by mouth See admin instructions. Take 1.5 tablets by mouth SIX times a day- 8 AM, 11 AM, 2 PM, 5 PM, 8 PM, and 11 PM (or bedtime), Disp: , Rfl:  .  donepezil (ARICEPT) 5 MG tablet, Take 10 mg by mouth at bedtime. , Disp: , Rfl: 1 .  MYRBETRIQ 25 MG TB24 tablet, Take 25 mg by mouth at bedtime., Disp: , Rfl: 2 .  NONFORMULARY OR COMPOUNDED ITEM, Place 1 application vaginally See admin instructions. Estradiol 0.1 mg pluronic gel: Place fingertip-amount in the vagina nightly X 4 weeks, then every other night, Disp: , Rfl:  .  phenazopyridine (PYRIDIUM) 200 MG tablet, Take 1 tablet (200 mg total) by mouth 2 (two) times daily as needed (urinary discomfort)., Disp: 10 tablet, Rfl: 0 .  Polyethylene Glycol 3350 (PEG 3350) POWD, Take 17 g by mouth daily as needed (for constipation). , Disp: , Rfl:    No Known Allergies   Social History   Occupational History  . Not on file  Tobacco Use  . Smoking status: Never Smoker  . Smokeless tobacco: Never Used  Substance and Sexual Activity  . Alcohol use: Yes    Comment: RARE  . Drug use: No  . Sexual activity: Not on file     Family History  Problem Relation Age of Onset  . Heart disease Mother      Immunization History  Administered Date(s) Administered  . Influenza, High Dose Seasonal PF 03/31/2016  . Influenza, Seasonal, Injecte, Preservative Fre 04/23/2014  . Influenza,inj,Quad PF,6+ Mos 04/17/2015  . Influenza,inj,quad, With Preservative 04/17/2015  . Influenza,trivalent, recombinat, inj, PF 03/31/2016  . Influenza-Unspecified 05/16/2009, 05/20/2010, 04/12/2011, 04/11/2013, 04/23/2014,  04/17/2015, 03/31/2016  . Pneumococcal Conjugate-13 08/12/2015  . Pneumococcal Polysaccharide-23 02/09/2017  . Tdap 05/16/2009  . Zoster 11/02/2012     Review of systems: Positive Findings in bold print.  Constitutional:  chills, fatigue, fever, sweats, weight change Communication: Optometrist, sign Ecologist, hand writing, iPad/Android device Head: headaches, head injury Eyes: changes in vision, eye pain, glaucoma, cataracts, macular degeneration, diplopia, glare,  light sensitivity, eyeglasses or contacts, blindness Ears nose mouth throat: Hard of hearing, ringing in ears, deaf, sign language,  vertigo,   nosebleeds,  rhinitis,  cold sores, snoring, swollen glands Cardiovascular: HTN, edema, arrhythmia, pacemaker in place, defibrillator in place,  chest pain/tightness, chronic anticoagulation, blood clot, heart failure Peripheral Vascular: leg cramps, varicose veins, blood clots, lymphedema Respiratory:  difficulty breathing, denies congestion, SOB, wheezing, cough, emphysema Gastrointestinal: change in appetite or weight, abdominal pain, constipation, diarrhea, nausea, vomiting, vomiting blood, change in bowel habits, abdominal pain, jaundice, rectal bleeding, hemorrhoids, Genitourinary:  nocturia,  pain on urination,  blood in urine, Foley catheter, urinary urgency Musculoskeletal: uses  mobility aid,  cramping, stiff joints, painful joints, decreased joint motion, fractures, OA, gout Skin: +changes in toenails, color change, dryness, itching, mole changes,  rash  Neurological: headaches, numbness in feet, paresthesias in feet, burning in feet, fainting,  seizures, change in speech. denies headaches, memory problems/poor historian, cerebral palsy, weakness, paralysis, tremors, Parkinson's Endocrine: diabetes, hypothyroidism, hyperthyroidism,  goiter, dry mouth, flushing, heat intolerance,  cold intolerance,  excessive thirst, denies polyuria,  nocturia Hematological:  easy  bleeding, excessive bleeding, easy bruising, enlarged lymph nodes, on long term blood thinner, history of past transusions Allergy/immunological:  hives, eczema, frequent infections, multiple drug allergies, seasonal allergies, transplant recipient Psychiatric:  anxiety, depression, mood disorder, suicidal ideations, hallucinations   Objective: Vascular Examination: Capillary refill time immediate x 10 digits Dorsalis pedis and posterior tibial pulses 2/4 b/l No digital hair x 10 digits Skin temperature gradient WNL b/l  Dermatological Examination: Skin with normal turgor, texture and tone b/l  Toenails 1-5 b/l discolored, thick, dystrophic with subungual debris and pain with palpation to nailbeds due to thickness of nails.  Musculoskeletal: Muscle strength 5/5 to all LE muscle groups  HAV with bunion noted b/l   Neurological: Sensation intact with 10 gram monofilament  Assessment: 1. Painful onychomycosis toenails 1-5 b/l    Plan: 1. Discussed onychomycosis and treatment options.  Literature dispensed on today. 2. Toenails 1-5 b/l were debrided in length and girth without iatrogenic bleeding. 3. Patient to continue soft, supportive shoe gear 4. Patient to report any pedal injuries to medical professional immediately. 5. Follow up 3 months. Patient/POA to call should there be a concern in the interim.

## 2018-07-20 NOTE — Patient Instructions (Signed)

## 2018-08-01 ENCOUNTER — Other Ambulatory Visit: Payer: Self-pay | Admitting: Family Medicine

## 2018-08-01 DIAGNOSIS — Z1231 Encounter for screening mammogram for malignant neoplasm of breast: Secondary | ICD-10-CM

## 2018-09-12 ENCOUNTER — Ambulatory Visit: Payer: Medicare Other

## 2018-09-12 ENCOUNTER — Ambulatory Visit
Admission: RE | Admit: 2018-09-12 | Discharge: 2018-09-12 | Disposition: A | Discharge status: Home / Self Care | Payer: Medicare Other | Source: Ambulatory Visit | Attending: Family Medicine | Admitting: Family Medicine

## 2018-09-12 DIAGNOSIS — Z1231 Encounter for screening mammogram for malignant neoplasm of breast: Secondary | ICD-10-CM

## 2018-10-08 ENCOUNTER — Emergency Department (HOSPITAL_COMMUNITY)
Admission: EM | Admit: 2018-10-08 | Discharge: 2018-10-08 | Disposition: A | Discharge status: Home / Self Care | Payer: Medicare Other | Attending: Emergency Medicine | Admitting: Emergency Medicine

## 2018-10-08 ENCOUNTER — Encounter (HOSPITAL_COMMUNITY): Payer: Self-pay

## 2018-10-08 ENCOUNTER — Other Ambulatory Visit: Payer: Self-pay

## 2018-10-08 DIAGNOSIS — H538 Other visual disturbances: Secondary | ICD-10-CM

## 2018-10-08 DIAGNOSIS — Z79899 Other long term (current) drug therapy: Secondary | ICD-10-CM | POA: Insufficient documentation

## 2018-10-08 DIAGNOSIS — Z7982 Long term (current) use of aspirin: Secondary | ICD-10-CM | POA: Diagnosis not present

## 2018-10-08 DIAGNOSIS — F0390 Unspecified dementia without behavioral disturbance: Secondary | ICD-10-CM | POA: Insufficient documentation

## 2018-10-08 DIAGNOSIS — I10 Essential (primary) hypertension: Secondary | ICD-10-CM | POA: Insufficient documentation

## 2018-10-08 LAB — BASIC METABOLIC PANEL
ANION GAP: 7 (ref 5–15)
BUN: 23 mg/dL (ref 8–23)
CO2: 25 mmol/L (ref 22–32)
Calcium: 8.5 mg/dL — ABNORMAL LOW (ref 8.9–10.3)
Chloride: 107 mmol/L (ref 98–111)
Creatinine, Ser: 0.73 mg/dL (ref 0.44–1.00)
GFR calc Af Amer: >60 mL/min (ref >60)
GFR calc non Af Amer: >60 mL/min (ref >60)
Glucose, Bld: 114 mg/dL — ABNORMAL HIGH (ref 70–99)
Potassium: 4 mmol/L (ref 3.5–5.1)
Sodium: 139 mmol/L (ref 135–145)

## 2018-10-08 LAB — CBC WITH DIFFERENTIAL/PLATELET
Abs Immature Granulocytes: 0.02 10*3/uL (ref 0.00–0.07)
Basophils Absolute: 0.1 10*3/uL (ref 0.0–0.1)
Basophils Relative: 1 %
Eosinophils Absolute: 0 10*3/uL (ref 0.0–0.5)
Eosinophils Relative: 1 %
HCT: 39.8 % (ref 36.0–46.0)
Hemoglobin: 12 g/dL (ref 12.0–15.0)
IMMATURE GRANULOCYTES: 0 %
Lymphocytes Relative: 23 %
Lymphs Abs: 1.5 10*3/uL (ref 0.7–4.0)
MCH: 25.1 pg — ABNORMAL LOW (ref 26.0–34.0)
MCHC: 30.2 g/dL (ref 30.0–36.0)
MCV: 83.1 fL (ref 80.0–100.0)
Monocytes Absolute: 0.6 10*3/uL (ref 0.1–1.0)
Monocytes Relative: 10 %
Neutro Abs: 4.1 10*3/uL (ref 1.7–7.7)
Neutrophils Relative %: 65 %
Platelets: 275 10*3/uL (ref 150–400)
RBC: 4.79 MIL/uL (ref 3.87–5.11)
RDW: 14.6 % (ref 11.5–15.5)
WBC: 6.3 10*3/uL (ref 4.0–10.5)
nRBC: 0 % (ref 0.0–0.2)

## 2018-10-08 MED ORDER — TETRACAINE HCL 0.5 % OP SOLN
2.0000 [drp] | Freq: Once | OPHTHALMIC | Status: AC
  Administered 2018-10-08: 2 [drp] via OPHTHALMIC
  Filled 2018-10-08: qty 4

## 2018-10-08 NOTE — Discharge Instructions (Addendum)
Go see Dr. Ellie Lunch at [address] [place] at [employer].   Call her at [phone] if any problems getting there tonight

## 2018-10-08 NOTE — ED Triage Notes (Signed)
Husband reports pt has not been seeing well for 2 days. Missing cup, dog etc. Her left eye is red and the pupil is than the left pupil.

## 2018-10-08 NOTE — ED Notes (Signed)
Pt takes Sinemet (carbidopa levodopa) for parkinson's. Dr. Dewayne Hatch has given the husband permission to give the pt the 5 pm and the next dose if the patient is still here.

## 2018-10-08 NOTE — ED Provider Notes (Signed)
[facility] EMERGENCY DEPARTMENT Provider Note   CSN: [id number] Arrival date & time: 10/08/18  1514    History   Chief Complaint Chief Complaint  Patient presents with  . Blurred Vision    right eye    HPI EVALIE HARGRAVES is a 68 y.o. female.     Patient has severe Parkinson's disease and her husband noticed that she did not seem to be seeing well with the left eye.  He states this is been going on for 2 days  The history is provided by the patient and a relative. No language interpreter was used.  Eye Problem  Location:  Left eye Quality: No pain. Severity:  Severe Onset quality:  Sudden Duration:  2 days Timing:  Constant Progression:  Unable to specify Chronicity:  New Context: not burn   Relieved by:  Nothing Worsened by:  Nothing Ineffective treatments:  None tried   Past Medical History:  Diagnosis Date  . Anxiety   . Erythema   . History of Clostridium difficile    dx 02-07-2015--  resolved  . History of septic shock   . Hyperlipidemia   . Hypertension    CURRENTLY NOT TAKING MED SECONDARY TO HYPOTENTION FROM SEPSIS--  MONITORED BY PCP  . Ischemic finger    erythemic of fingers secondary to septic shock 12-17-2014--  HEALING  . 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 ([place])    secondary to septic shock 12-17-2014--  healing  . Keratosis seborrheica   . Left ureteral stone   . Mild dementia ([place])   . Parkinson disease [facility])     Patient Active Problem List   Diagnosis Date Noted  . 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[place]) 09/06/2017  . Syncope 06/13/2017  . 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 Age-related nuclear cataract of both eyes 09/16/2016  . Presbyopia of both eyes 09/16/2016  . Diarrhea of presumed infectious origin 09/12/2015  . Hyperlipidemia 09/12/2015  . Sepsis ([place]) 09/12/2015  . Anxiety 02/04/2015  . Injury of finger, superficial, infected 02/04/2015  . Injury of foot, left 02/04/2015  . Keratosis, seborrheic 02/04/2015  .  Memory loss 02/04/2015  . Neoplasm of uncertain behavior of skin of face 02/04/2015  . Tremor 02/04/2015  . Undiagnosed cardiac murmurs 02/04/2015  . Fracture of fifth toe, left, closed 02/03/2015  . Ischemia of toes bilaterally 01/27/2015  . Ischemia of fingers--bilaterally 01/27/2015  . Hypokalemia 01/24/2015  . Edema--BLE 01/24/2015  . Leucocytosis 01/24/2015  . Bacteremia due to Escherichia coli 01/24/2015  . Debility 01/16/2015  . Acute renal failure syndrome ([place])   . E. coli UTI   . Leukocytosis   . 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 ([place])   . Thrombocytopenia ([place])   . Metabolic encephalopathy   . Acute respiratory failure with hypoxia ([place])   . Parkinson's disease ([place])   . Altered mental status   . Encounter for central line placement   . Pulmonary infiltrates   . Abdominal pain, lower   . Hypoxia   . Pyelonephritis   . Pyonephrosis   . Septic shock ([place]) 01/09/2015  . Essential hypertension 10/04/2014  . Abnormal mammogram of right breast 08/28/2014  . Mild cognitive impairment 05/23/2014  . Parkinsonian features 05/23/2014  . 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 APPENDECTOMY    . BREAST EXCISIONAL BIOPSY Right    CSL  . BREAST LUMPECTOMY WITH RADIOACTIVE SEED LOCALIZATION Right 08/28/2014   Procedure: RIGHT  BREAST LUMPECTOMY WITH RADIOACTIVE SEED LOCALIZATION;  Surgeon: [name], MD;  Location: [facility];  Service: General;  Laterality: Right;  . CESAREAN SECTION  x2  . COLONOSCOPY    . CYSTOSCOPY W/ URETERAL STENT PLACEMENT Left 02/28/2015   Procedure: CYSTOSCOPY WITH STENT REPLACEMENT;  Surgeon: [name], MD;  Location[facility];  Service: Urology;  Laterality: Left;  . CYSTOSCOPY WITH RETROGRADE PYELOGRAM, URETEROSCOPY AND STENT PLACEMENT Left 02/07/2015   Procedure: CYSTOSCOPY WITH LEFT RETROGRADE PYELOGRAM, URETEROSCOPY AND STENT PLACEMENT. REMOVAL OF  UROSTOMY TUBE;  Surgeon: [name], MD;  Location: [facility];  Service: Urology;  Laterality: Left;  . CYSTOSCOPY/RETROGRADE/URETEROSCOPY/STONE EXTRACTION WITH BASKET Left 02/28/2015   Procedure: CYSTOSCOPY/RETROGRADE/URETEROSCOPY/STONE EXTRACTION WITH BASKET;  Surgeon: [name], MD;  Location: [facility];  Service: Urology;  Laterality: Left;  . HOLMIUM LASER APPLICATION Left 4/74/2595   Procedure: WITH HOLMIUM LASER ;  Surgeon: [name], MD;  Location: [facility];  Service: Urology;  Laterality: Left;  . NEGATIVE SLEEP STUDY  2013  . OVARIAN CYST REMOVAL  age 12     OB History   No obstetric history on file.      Home Medications    Prior to Admission medications   Medication Sig Start Date End Date Taking? Authorizing Provider  aspirin EC 81 MG tablet Take 81 mg by mouth daily.   Yes [provider]  carbidopa-levodopa (SINEMET IR) 25-100 MG per tablet Take 1.5 tablets by mouth See admin instructions. Take 1.5 tablets by mouth SIX times a day- 8 AM, 11 AM, 2 PM, 5 PM, 8 PM, and 11 PM (or bedtime)   Yes [provider]  donepezil (ARICEPT) 5 MG tablet Take 10 mg by mouth at bedtime.  03/27/18  Yes [provider]  phenazopyridine (PYRIDIUM) 200 MG tablet Take 1 tablet (200 mg total) by mouth 2 (two) times daily as needed (urinary discomfort). Patient not taking: Reported on 10/08/2018 07/13/16   Lorayne Bender, PA-C    Family History Family History  Problem Relation Age of Onset  . Heart disease Mother   . Breast cancer Neg Hx     Social History Social History   Tobacco Use  . Smoking status: Never Smoker  . Smokeless tobacco: Never Used  Substance Use Topics  . Alcohol use: Yes    Comment: RARE  . Drug use: No     Allergies   Patient has no known allergies.   Review of Systems Review of Systems  Unable to perform ROS: Dementia     Physical Exam Updated Vital Signs BP 140/68   Pulse 74    Temp 98.6 F (37 C) (Oral)   Resp 17   Ht 5\' 1"  (1.549 m)   Wt 40.8 kg   SpO2 99%   BMI 17.01 kg/m   Physical Exam Constitutional:      Appearance: She is well-developed.  HENT:     Head: Normocephalic.     Nose: Nose normal.  Eyes:     General: No scleral icterus.    Comments: Patient with pupils 5 mm and nonreactive.  Conjunctive is inflamed.  Visual acuity essentially patient does not see anything in that left eye.  She also does not seem to see light.  Pressures were unable to be obtained because patient with not cooperating  Neck:     Musculoskeletal: Neck supple.     Thyroid: No thyromegaly.  Cardiovascular:     Rate  and Rhythm: Normal rate and regular rhythm.     Heart sounds: No murmur. No friction rub. No gallop.   Pulmonary:     Breath sounds: No stridor. No wheezing or rales.  Chest:     Chest wall: No tenderness.  Abdominal:     General: There is no distension.     Tenderness: There is no abdominal tenderness. There is no rebound.  Musculoskeletal: Normal range of motion.     Comments: Patient moving all over from the Parkinson's  Lymphadenopathy:     Cervical: No cervical adenopathy.  Skin:    Findings: No erythema or rash.  Neurological:     Motor: No abnormal muscle tone.     Coordination: Coordination normal.  Psychiatric:        Behavior: Behavior normal.      ED Treatments / Results  Labs (all labs ordered are listed, but only abnormal results are displayed) Labs Reviewed  CBC WITH DIFFERENTIAL/PLATELET - Abnormal; Notable for the following components:      Result Value   MCH 25.1 (*)    All other components within normal limits  BASIC METABOLIC PANEL - Abnormal; Notable for the following components:   Glucose, Bld 114 (*)    Calcium 8.5 (*)    All other components within normal limits    EKG None  Radiology No results found.  Procedures Procedures (including critical care time)  Medications Ordered in ED Medications  tetracaine  (PONTOCAINE) 0.5 % ophthalmic solution 2 drop (2 drops Both Eyes Given by Other 10/08/18 1800)     Initial Impression / Assessment and Plan / ED Course  I have reviewed the triage vital signs and the nursing notes.  Pertinent labs & imaging results that were available during my care of the patient were reviewed by me and considered in my medical decision making (see chart for details).        I spoke with ophthalmology on-call and she will see the patient in her office in an hour  Final Clinical Impressions(s) / ED Diagnoses   Final diagnoses:  Blurred vision    ED Discharge Orders    None       Milton Ferguson, MD 10/08/18 [phone]

## 2018-10-08 NOTE — ED Notes (Signed)
Patient verbalizes understanding of discharge instructions. Opportunity for questioning and answers were provided. Armband removed by staff, pt discharged from ED.  

## 2018-10-19 ENCOUNTER — Ambulatory Visit: Payer: Medicare Other | Admitting: Podiatry

## 2019-08-06 ENCOUNTER — Other Ambulatory Visit: Payer: Self-pay | Admitting: Family Medicine

## 2019-08-06 DIAGNOSIS — Z1231 Encounter for screening mammogram for malignant neoplasm of breast: Secondary | ICD-10-CM

## 2019-09-14 ENCOUNTER — Ambulatory Visit: Payer: Medicare Other

## 2019-11-09 ENCOUNTER — Ambulatory Visit: Payer: Medicare Other

## 2019-11-23 ENCOUNTER — Ambulatory Visit
Admission: RE | Admit: 2019-11-23 | Discharge: 2019-11-23 | Disposition: A | Discharge status: Home / Self Care | Payer: Medicare Other | Source: Ambulatory Visit | Attending: Family Medicine | Admitting: Family Medicine

## 2019-11-23 ENCOUNTER — Other Ambulatory Visit: Payer: Self-pay

## 2019-11-23 DIAGNOSIS — Z1231 Encounter for screening mammogram for malignant neoplasm of breast: Secondary | ICD-10-CM

## 2020-02-05 ENCOUNTER — Other Ambulatory Visit: Payer: Self-pay

## 2020-02-05 ENCOUNTER — Ambulatory Visit: Payer: Medicare Other | Attending: Family Medicine | Admitting: Speech Pathology

## 2020-02-05 DIAGNOSIS — R293 Abnormal posture: Secondary | ICD-10-CM | POA: Insufficient documentation

## 2020-02-05 DIAGNOSIS — R41841 Cognitive communication deficit: Secondary | ICD-10-CM | POA: Insufficient documentation

## 2020-02-05 DIAGNOSIS — M6281 Muscle weakness (generalized): Secondary | ICD-10-CM | POA: Diagnosis present

## 2020-02-05 DIAGNOSIS — R2689 Other abnormalities of gait and mobility: Secondary | ICD-10-CM | POA: Diagnosis present

## 2020-02-05 DIAGNOSIS — R2681 Unsteadiness on feet: Secondary | ICD-10-CM | POA: Insufficient documentation

## 2020-02-05 DIAGNOSIS — R471 Dysarthria and anarthria: Secondary | ICD-10-CM | POA: Diagnosis present

## 2020-02-06 NOTE — Therapy (Signed)
[facility] [address] [place], [place], [address] Phone: [phone]   Fax:  [phone]  Speech Language Pathology Evaluation  Patient Details  Name: Becky Rogers MRN: [id number] Date of Birth: Feb 06, 1951 Referring Provider (SLP): Bing Matter, [place]   Encounter Date: 02/05/2020   End of Session - 02/06/20 0914    Visit Number 1    Number of Visits 3    Date for SLP Re-Evaluation 04/06/20    Authorization Type Medicare and AARP supplement    SLP Start Time 3557    SLP Stop Time  1702    SLP Time Calculation (min) 45 min    Activity Tolerance Other (comment)   limited to attention/behaviors          Past Medical History:  Diagnosis Date  . Anxiety   . Erythema   . History of Clostridium difficile    dx 02-07-2015--  resolved  . History of septic shock   . Hyperlipidemia   . Hypertension    CURRENTLY NOT TAKING MED SECONDARY TO HYPOTENTION FROM SEPSIS--  MONITORED BY PCP  . Ischemic finger    erythemic of fingers secondary to septic shock 12-17-2014--  HEALING  . 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 ([place])    secondary to septic shock 12-17-2014--  healing  . Keratosis seborrheica   . Left ureteral stone   . Mild dementia ([place])   . Parkinson disease [facility])     Past Surgical History:  Procedure Laterality Date  . APPENDECTOMY    . BREAST EXCISIONAL BIOPSY Right    CSL  . BREAST LUMPECTOMY WITH RADIOACTIVE SEED LOCALIZATION Right 08/28/2014   Procedure: RIGHT BREAST LUMPECTOMY WITH RADIOACTIVE SEED LOCALIZATION;  Surgeon: [name], MD;  Location: [facility];  Service: General;  Laterality: Right;  . CESAREAN SECTION  x2  . COLONOSCOPY    . CYSTOSCOPY W/ URETERAL STENT PLACEMENT Left 02/28/2015   Procedure: CYSTOSCOPY WITH STENT REPLACEMENT;  Surgeon: [name], MD;  Location: [facility];  Service: Urology;  Laterality: Left;  . CYSTOSCOPY WITH RETROGRADE  PYELOGRAM, URETEROSCOPY AND STENT PLACEMENT Left 02/07/2015   Procedure: CYSTOSCOPY WITH LEFT RETROGRADE PYELOGRAM, URETEROSCOPY AND STENT PLACEMENT. REMOVAL OF UROSTOMY TUBE;  Surgeon: [name], MD;  [facility];  Service: Urology;  Laterality: Left;  . CYSTOSCOPY/RETROGRADE/URETEROSCOPY/STONE EXTRACTION WITH BASKET Left 02/28/2015   Procedure: CYSTOSCOPY/RETROGRADE/URETEROSCOPY/STONE EXTRACTION WITH BASKET;  Surgeon: [name], MD;  Location: [facility];  Service: Urology;  Laterality: Left;  . HOLMIUM LASER APPLICATION Left 09/30/252   Procedure: WITH HOLMIUM LASER ;  Surgeon: [name], MD;  Location: [facility];  Service: Urology;  Laterality: Left;  . NEGATIVE SLEEP STUDY  2013  . OVARIAN CYST REMOVAL  age 4    There were no vitals filed for this visit.   Subjective Assessment - 02/05/20 1620    Subjective "I think I'm here... to meet people who do have pain and to meet them." (re: why she's here. SLP had previously inquired about pt's pain)             SLP Evaluation [facility] - 02/06/20 2706      SLP Visit Information   SLP Received On 02/05/20    Referring Provider (SLP) Bing Matter, PA-C    Onset Date referral 01/21/20   estimated disease onset 2015 per MD notes   Medical Diagnosis Parkinson's disease, Lewy body dementia      Subjective  Subjective husband guides patient when walking    Patient/Family Stated Goal "stay out of a facility as long as humanly possible"      General Information   HPI Patient is a 69 year old female with past medical history noted for Parkinson's disease and Lewy Body dementia (rated moderately-severe per [facility] memory clinic in 12/20), delusions, agitation, hallucinations, multiple falls, depression, anxiety. 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 Husband reports deterioration in cognition and muscle tone coinciding with decreased participation in  community activites during the Covid-19 pandemic. She requires 24 hour supervision due to wandering and destructive behaviors. Spouse turns off stove breakers when sleeping due to pt playing with the stove. She refused MOCA testing at memory clinic on 06/12/19.     Behavioral/Cognition alert, decreased sustained attention/eye contact, near-constant dyskinesias    Mobility Status ambulated with mod assist      Balance Screen   Has the patient fallen in the past 6 months Yes    How many times? several; PT eval pending      Prior Functional Status   Cognitive/Linguistic Baseline Baseline deficits    Baseline deficit details dementia, memory loss, hallucinations, agitation, disinhibition     Type of Home House     Lives With Spouse    Available Support Family;Available 24 hours/day   husband provides 24 hour supervision; daughters 6 hours/mon   Vocation Retired      Associate Professor   Overall Cognitive Status History of cognitive impairments - at baseline    Attention Focused;Sustained    Focused Attention Impaired    Focused Attention Impairment Verbal basic   attends inconsistently when name called or with tactile cues   Sustained Attention Impaired    Sustained Attention Impairment Verbal basic;Functional basic    Memory Impaired    Memory Impairment Decreased long term memory;Decreased short term memory;Prospective memory;Decreased recall of new information;Storage deficit;Retrieval deficit    Decreased Long Term Memory --   does not always know husband   Decreased Short Term Memory Verbal basic    Awareness Impaired    Awareness Impairment Intellectual impairment   "I don't think so." re: if problems with speech/memory   Problem Solving Impaired    Problem Solving Impairment --   due to lower level impairments   Executive Function --   impaired due to lower-level deficits   Behaviors Restless;Impulsive;Confabulation   verbal and physical agitation not seen but reported     Auditory  Comprehension   Overall Auditory Comprehension Impaired    Commands Impaired    One Step Basic Commands 50-74% accurate    Conversation Simple    Interfering Components Attention;Processing speed    EffectiveTechniques Extra processing time;Slowed speech;Stressing words;Pausing;Repetition;Visual/Gestural cues      Visual Recognition/Discrimination   Discrimination Not tested      Reading Comprehension   Reading Status Impaired    Sentence Level --   Read "close your eyes" but did not follow command     Expression   Primary Mode of Expression Verbal      Verbal Expression   Overall Verbal Expression Impaired    Level of Generative/Spontaneous Verbalization Phrase    Repetition Impaired    Level of Impairment Phrase level   mod max cues for "no ifs ands or buts"   Pragmatics Impairment    Impairments Eye contact;Abnormal affect    Interfering Components Attention    Non-Verbal Means of Communication Not applicable  Written Expression   Dominant Hand Right    Written Expression Not tested    Overall Writen Expression wrote "[place]"; could not formulate sentence on command      Motor Speech   Overall Motor Speech Impaired    Phonation Low vocal intensity    Resonance Within functional limits    Articulation Impaired    Level of Impairment Phrase   hypoarticulation   Intelligibility Intelligibility reduced    Word 75-100% accurate    Sentence 75-100% accurate      Standardized Assessments   Standardized Assessments  Other Assessment    Other Assessment MMSE 6/30                               SLP Long Term Goals - 02/05/20 1731      SLP LONG TERM GOAL #1   Title Pt/husband will verbalize knowledge of appropriate cognitive-linguistic activitities for pt at home.    Time 2    Period Weeks    Status New      SLP LONG TERM GOAL #2   Title Pt/husband will demonstrate understanding of signs of dysphagia and aspiration PNA.    Time 2    Period  Weeks    Status New            Plan - 02/06/20 0913    Clinical Impression Statement Becky Rogers presents with severe cognitive communication impairment characterized by decreased focused/sustained attention, disinhibition (wandering in room, required redirection from fidgeting with SLP's computer and electrical outlets), impaired memory, and therefore higher-level cognitive processes are also impaired. Vocal intensity is reduced at average of low 60s dB, range 56-71 dB. She has difficulty following single-step commands. Husband denies pt has any signs of swallowing difficulty, although she did have decreased PO intake, weight loss which has since improved, stabalized. Given severity of deficits, as well as difficulty maintaining attention for simple tasks, SLP educated spouse that outpatient SLP services not likely to provide much functional benefit for patient. She has done home health in the past and SLP feels this may be most appropriate service for patient, functionally. I recommend brief course of skilled ST focused on caregiver education re: home management of behaviors, signs of swallowing impairment which may occur with progressing dementia, and appropriate cognitive-linguistic activities for patient in the home.    Speech Therapy Frequency 1x /week    Duration 2 weeks    Treatment/Interventions Aspiration precaution training;SLP instruction and feedback;Cueing hierarchy;Patient/family education;Internal/external aids;Compensatory strategies;Functional tasks    Potential to Achieve Goals Fair    Potential Considerations Medical prognosis;Severity of impairments;Previous level of function;Cooperation/participation level;Ability to learn/carryover information    Consulted and Agree with Plan of Care Patient           Patient will benefit from skilled therapeutic intervention in order to improve the following deficits and impairments:   Cognitive communication deficit  Dysarthria and  anarthria    Problem List Patient Active Problem List   Diagnosis Date Noted  . 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[place]) 09/06/2017  . Syncope 06/13/2017  . 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 Age-related nuclear cataract of both eyes 09/16/2016  . Presbyopia of both eyes 09/16/2016  . Diarrhea of presumed infectious origin 09/12/2015  . Hyperlipidemia 09/12/2015  . Sepsis ([place]) 09/12/2015  . Anxiety 02/04/2015  . Injury of finger, superficial, infected 02/04/2015  . Injury of foot, left 02/04/2015  . Keratosis, seborrheic 02/04/2015  . Memory loss 02/04/2015  .  Neoplasm of uncertain behavior of skin of face 02/04/2015  . Tremor 02/04/2015  . Undiagnosed cardiac murmurs 02/04/2015  . Fracture of fifth toe, left, closed 02/03/2015  . Ischemia of toes bilaterally 01/27/2015  . Ischemia of fingers--bilaterally 01/27/2015  . Hypokalemia 01/24/2015  . Edema--BLE 01/24/2015  . Leucocytosis 01/24/2015  . Bacteremia due to Escherichia coli 01/24/2015  . Debility 01/16/2015  . Acute renal failure syndrome ([place])   . E. coli UTI   . Leukocytosis   . 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 ([place])   . Thrombocytopenia ([place])   . Metabolic encephalopathy   . Acute respiratory failure with hypoxia ([place])   . Parkinson's disease ([place])   . Altered mental status   . Encounter for central line placement   . Pulmonary infiltrates   . Abdominal pain, lower   . Hypoxia   . Pyelonephritis   . Pyonephrosis   . Septic shock ([place]) 01/09/2015  . Essential hypertension 10/04/2014  . Abnormal mammogram of right breast 08/28/2014  . Mild cognitive impairment 05/23/2014  . Parkinsonian features 05/23/2014  . 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 [place], [place], [address] Phone: [phone]   Fax:  [phone]  Name: Becky Rogers MRN: [id number] Date of Birth: 05-09-1951

## 2020-02-07 ENCOUNTER — Ambulatory Visit: Payer: Medicare Other | Admitting: Physical Therapy

## 2020-02-07 ENCOUNTER — Other Ambulatory Visit: Payer: Self-pay

## 2020-02-07 DIAGNOSIS — M6281 Muscle weakness (generalized): Secondary | ICD-10-CM

## 2020-02-07 DIAGNOSIS — R293 Abnormal posture: Secondary | ICD-10-CM

## 2020-02-07 DIAGNOSIS — R41841 Cognitive communication deficit: Secondary | ICD-10-CM | POA: Diagnosis not present

## 2020-02-07 DIAGNOSIS — R2681 Unsteadiness on feet: Secondary | ICD-10-CM

## 2020-02-07 DIAGNOSIS — R2689 Other abnormalities of gait and mobility: Secondary | ICD-10-CM

## 2020-02-07 NOTE — Therapy (Signed)
[facility] [address] [name], [place], [address] Phone: [phone]   Fax:  [phone]  Physical Therapy Evaluation  Patient Details  Name: Becky Rogers MRN: [id number] Date of Birth: Nov 28, 1950 Referring Provider (PT): Bing Matter, [place]   Encounter Date: 02/07/2020   PT End of Session - 02/07/20 1950    Visit Number 1    Number of Visits 5    Date for PT Re-Evaluation 42/68/34   60 day cert for 5 wk POC   Authorization Type Medicare/AARP    PT Start Time 1231    PT Stop Time 1315    PT Time Calculation (min) 44 min    Equipment Utilized During Treatment Gait belt    Activity Tolerance Other (comment)   limited attention/distracted behaviors   Behavior During Therapy Restless   writhing/dyskinesias          Past Medical History:  Diagnosis Date  . Anxiety   . Erythema   . History of Clostridium difficile    dx 02-07-2015--  resolved  . History of septic shock   . Hyperlipidemia   . Hypertension    CURRENTLY NOT TAKING MED SECONDARY TO HYPOTENTION FROM SEPSIS--  MONITORED BY PCP  . Ischemic finger    erythemic of fingers secondary to septic shock 12-17-2014--  HEALING  . 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 Keratosis seborrheica   . Left ureteral stone   . Mild dementia ([place])   . Parkinson disease [facility])     Past Surgical History:  Procedure Laterality Date  . APPENDECTOMY    . BREAST EXCISIONAL BIOPSY Right    [facility]  . BREAST LUMPECTOMY WITH RADIOACTIVE SEED LOCALIZATION Right 08/28/2014   Procedure: RIGHT BREAST LUMPECTOMY WITH RADIOACTIVE SEED LOCALIZATION;  Surgeon: [name], MD;  Location: [facility];  Service: General;  Laterality: Right;  . CESAREAN SECTION  x2  . COLONOSCOPY    . CYSTOSCOPY W/ URETERAL STENT PLACEMENT Left 02/28/2015   Procedure: CYSTOSCOPY WITH STENT REPLACEMENT;  Surgeon: [name],  MD;  Location: [facility];  Service: Urology;  Laterality: Left;  . CYSTOSCOPY WITH RETROGRADE PYELOGRAM, URETEROSCOPY AND STENT PLACEMENT Left 02/07/2015   Procedure: CYSTOSCOPY WITH LEFT RETROGRADE PYELOGRAM, URETEROSCOPY AND STENT PLACEMENT. REMOVAL OF UROSTOMY TUBE;  Surgeon: [name], MD;  Location: [facility];  Service: Urology;  Laterality: Left;  . CYSTOSCOPY/RETROGRADE/URETEROSCOPY/STONE EXTRACTION WITH BASKET Left 02/28/2015   Procedure: CYSTOSCOPY/RETROGRADE/URETEROSCOPY/STONE EXTRACTION WITH BASKET;  Surgeon: [name], MD;  Location: [facility];  Service: Urology;  Laterality: Left;  . HOLMIUM LASER APPLICATION Left 1/96/2229   Procedure: WITH HOLMIUM LASER ;  Surgeon: [name], MD;  Location: [facility];  Service: Urology;  Laterality: Left;  . NEGATIVE SLEEP STUDY  2013  . OVARIAN CYST REMOVAL  age 49    There were no vitals filed for this visit.    Subjective Assessment - 02/07/20 1236    Subjective Husband provides history:  She has LBD from Parkinson's.  Prior to Covid, she was doing well physically, but cognitively wasn't good.  She was going to spin class, RSB, yoga.  Since Covid, she has lost physicality and want to be able to get her back into those things.  Has had 6-8 falls in the past 6 months.    Patient is accompained by: Family member   Husband   Patient Stated  Goals Goals are to help get her engaged to get back physical strength. (per husband)    Currently in Pain? No/denies              [facility] PT Assessment - 02/07/20 1239      Assessment   Medical Diagnosis parkinson's disease, LBD    Referring Provider (PT) Bing Matter, PA-C    Onset Date/Surgical Date 01/21/20   MD order     Precautions   Precautions Fall    Precaution Comments Vision loss L eye; hallucinations      Balance Screen   Has the patient fallen in the past 6 months Yes    How many times? 6-8    Has the patient had a decrease  in activity level because of a fear of falling?  No    Is the patient reluctant to leave their home because of a fear of falling?  No      Home Social worker Private residence    Living Arrangements Spouse/significant other    Available Help at Discharge Family    Type of [place] to enter    Entrance Stairs-Number of Steps 1    [place] Two level;Able to live on main level with bedroom/bathroom;Bed/bath upstairs      Prior Function   Level of Independence Needs assistance with ADLs;Needs assistance with gait;Needs assistance with transfers    Leisure Prior to [place], participated in [employer], spin class, and yoga      Cognition   Overall Cognitive Status History of cognitive impairments - at baseline      Observation/Other Assessments   Focus on Therapeutic Outcomes (FOTO)  NA      Posture/Postural Control   Posture/Postural Control Postural limitations    Postural Limitations Rounded Shoulders;Posterior pelvic tilt;Flexed trunk    Posture Comments Pt sits in chair with rounded posture in low back, with legs crossed x 2, appearing that she may slide out of chair; cues to correct and sit up tall in chair.  Pt is able to correct, but reverts back to previously mentioned posture.      ROM / Strength   AROM / PROM / Strength Strength;AROM      AROM   Overall AROM  Within functional limits for tasks performed   for lower extremities     Strength   Overall Strength Deficits;Due to impaired cognition   Difficult to assess MMT   Overall Strength Comments Grossly tested at least 3+/5 hip flexors bilaterally, quads bilaterally      Transfers   Transfers Sit to Stand;Stand to Sit    Sit to Stand 5: Supervision;4: Min guard;From chair/3-in-1    Five time sit to stand comments  5.12   5.97   Stand to Sit 5: Supervision;4: Min guard;Without upper extremity assist;To chair/3-in-1    Comments Pt performs sit<>stand quickly, rapid pace  with husband's cues to initiate.      Ambulation/Gait   Ambulation/Gait Yes    Ambulation/Gait Assistance 4: Min guard    Ambulation Distance (Feet) 60 Feet   x 2   Assistive device None    Gait Pattern Step-through pattern;Decreased arm swing - right;Decreased arm swing - left;Scissoring;Narrow base of support;Trunk flexed   Veers to L at times   Ambulation Surface Level;Indoor    Gait velocity 11.78 sec = 2.78 ft/sec    Gait Comments 528 ft in 3 minute walk test.  Standardized Balance Assessment   Standardized Balance Assessment Timed Up and Go Test      Timed Up and Go Test   TUG Normal TUG    Normal TUG (seconds) 15.53    TUG Comments Scores >13.5 sec indicate increased fall risk.           Pt requires constant supervision, min guard assist and redirection during eval activities.           Objective measurements completed on examination: See above findings.             Self Care:  PT Education - 02/07/20 1949    Education Details PT eval results, POC; ideas for beginning more exercises at home (sit<>stand with ball overhead ball raise), seated ball toss/catch (or balloon toss/catch)    Person(s) Educated Patient;Spouse    Methods Explanation    Comprehension Verbalized understanding               PT Long Term Goals - 02/07/20 2004      PT LONG TERM GOAL #1   Title Pt/husband will demonstrate understanding of HEP to address balance, strengthening, and gait for improved mobility.  TARGET 4 weeks, 03/07/2020    Time 5    Period Weeks    Status New      PT LONG TERM GOAL #2   Title Pt will improve gait distance in 3 minute walk test to at least 580 ft, demonstrating improved gait efficiency and endurance.    Time 5    Period Weeks    Status New      PT LONG TERM GOAL #3   Title Pt will improve TUG score to less than or equal to 13.5 sec, to demonstrate decreased fall risk.    Time 5    Period Weeks    Status New      PT LONG TERM  GOAL #4   Title Pt will perform at least 15 minutes of consecutive aerobic activity, to demonstrate improved endurance for potential return to community fitness.    Time 5    Period Weeks    Status New                  Plan - 02/07/20 1955    Clinical Impression Statement Patient is a 69 year old female with past medical history noted for Parkinson's disease and Lewy Body dementia (rated moderately-severe per [facility] memory clinic in 12/20), delusions, agitation, hallucinations, multiple falls, depression, anxiety. 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 Husband reports deterioration in cognition and muscle tone coinciding with decreased participation in community activites [employer], spin cycle class, yoga) during the Covid-19 pandemic. She requires 24 hour supervision due to wandering and destructive behaviors.  She presents to OPPT today with decreased lower extremity strength, abnormal posture, decreased balance and decreased timing/coordination/safety with gait.  She is at increased fall risk due to TUG score of 15.53 and she demonstrated decreased gait distance in 3 minute walk test.  Further balance tests limited due to pt's cognition.  She has had 6-8 falls in past 6 months.  She would benefit from skilled PT to initiate more formal/functional HEP for stregnthening, balance, and gait endurance in home, to prevent future falls and improve overall functional mobility.    Personal Factors and Comorbidities Comorbidity 3+;Behavior Pattern    Comorbidities See PMH/problem list    Examination-Activity Limitations Locomotion Level;Reach Overhead;Stand;Stairs;Transfers  Examination-Participation Restrictions Community Activity;Other   Community fitness   Stability/Clinical Decision Making Evolving/Moderate complexity    Clinical Decision Making Moderate    Rehab Potential Fair   due to cognition; has good family support of husband   PT  Frequency 1x / week    PT Duration 4 weeks   plus eval   PT Treatment/Interventions ADLs/Self Care Home Management;Neuromuscular re-education;Balance training;Therapeutic exercise;Therapeutic activities;Functional mobility training;Gait training;Patient/family education    PT Next Visit Plan Did they try sit<>stand with ball raise overhead or ball/toss catch (try again and write this as HEP); work on functional activities for strength and balance; try use of therapy ball/boom whackers    Consulted and Agree with Plan of Care Patient;Family member/caregiver    Family Member Consulted Husband           Patient will benefit from skilled therapeutic intervention in order to improve the following deficits and impairments:  Abnormal gait, Difficulty walking, Decreased endurance, Decreased safety awareness, Decreased balance, Decreased mobility, Decreased strength, Postural dysfunction  Visit Diagnosis: Other abnormalities of gait and mobility  Muscle weakness (generalized)  Unsteadiness on feet  Abnormal posture     Problem List Patient Active Problem List   Diagnosis Date Noted  . 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[place]) 09/06/2017  . Syncope 06/13/2017  . 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 Age-related nuclear cataract of both eyes 09/16/2016  . Presbyopia of both eyes 09/16/2016  . Diarrhea of presumed infectious origin 09/12/2015  . Hyperlipidemia 09/12/2015  . Sepsis ([place]) 09/12/2015  . Anxiety 02/04/2015  . Injury of finger, superficial, infected 02/04/2015  . Injury of foot, left 02/04/2015  . Keratosis, seborrheic 02/04/2015  . Memory loss 02/04/2015  . Neoplasm of uncertain behavior of skin of face 02/04/2015  . Tremor 02/04/2015  . Undiagnosed cardiac murmurs 02/04/2015  . Fracture of fifth toe, left, closed 02/03/2015  . Ischemia of toes bilaterally 01/27/2015  . Ischemia of fingers--bilaterally 01/27/2015  . Hypokalemia 01/24/2015  . Edema--BLE 01/24/2015  . Leucocytosis  01/24/2015  . Bacteremia due to Escherichia coli 01/24/2015  . Debility 01/16/2015  . Acute renal failure syndrome ([place])   . E. coli UTI   . Leukocytosis   . 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 Thrombocytopenia ([place])   . Metabolic encephalopathy   . Acute respiratory failure with hypoxia ([place])   . Parkinson's disease ([place])   . Altered mental status   . Encounter for central line placement   . Pulmonary infiltrates   . Abdominal pain, lower   . Hypoxia   . Pyelonephritis   . Pyonephrosis   . Septic shock ([place]) 01/09/2015  . Essential hypertension 10/04/2014  . Abnormal mammogram of right breast 08/28/2014  . Mild cognitive impairment 05/23/2014  . Parkinsonian features 05/23/2014  . 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 02/07/2020, 8:09 PM  Frazier Butt., PT   [facility] [address] [place], [place], [address] Phone: [phone]   Fax:  [phone]  Name: GHAZAL PEVEY MRN: [id number] Date of Birth: 16-Jun-1951

## 2020-02-15 ENCOUNTER — Ambulatory Visit: Payer: Medicare Other | Admitting: Physical Therapy

## 2020-02-19 ENCOUNTER — Other Ambulatory Visit: Payer: Self-pay

## 2020-02-19 ENCOUNTER — Encounter: Payer: Self-pay | Admitting: Occupational Therapy

## 2020-02-19 ENCOUNTER — Ambulatory Visit: Payer: Medicare Other | Attending: Family Medicine | Admitting: Occupational Therapy

## 2020-02-19 DIAGNOSIS — R293 Abnormal posture: Secondary | ICD-10-CM

## 2020-02-19 DIAGNOSIS — R2681 Unsteadiness on feet: Secondary | ICD-10-CM

## 2020-02-19 DIAGNOSIS — R41841 Cognitive communication deficit: Secondary | ICD-10-CM | POA: Insufficient documentation

## 2020-02-19 DIAGNOSIS — R278 Other lack of coordination: Secondary | ICD-10-CM | POA: Diagnosis present

## 2020-02-19 DIAGNOSIS — M6281 Muscle weakness (generalized): Secondary | ICD-10-CM | POA: Insufficient documentation

## 2020-02-19 DIAGNOSIS — R29898 Other symptoms and signs involving the musculoskeletal system: Secondary | ICD-10-CM | POA: Insufficient documentation

## 2020-02-19 DIAGNOSIS — R29818 Other symptoms and signs involving the nervous system: Secondary | ICD-10-CM | POA: Diagnosis present

## 2020-02-19 DIAGNOSIS — R41840 Attention and concentration deficit: Secondary | ICD-10-CM | POA: Insufficient documentation

## 2020-02-19 DIAGNOSIS — R41844 Frontal lobe and executive function deficit: Secondary | ICD-10-CM | POA: Insufficient documentation

## 2020-02-19 NOTE — Therapy (Addendum)
[facility] [address] [place], [place], [address] Phone: [phone]   Fax:  [phone]  Occupational Therapy Evaluation  Patient Details  Name: Becky Rogers MRN: [id number] Date of Birth: 07-20-1950 Referring Provider (OT): Bing Matter, [place]   Encounter Date: 02/19/2020   OT End of Session - 02/19/20 [place]    Visit Number 1    Number of Visits 5    Date for OT Re-Evaluation 03/27/20    Authorization Type Medicare & AARP supplement covered 100%    Authorization Time Period cert. date 02/19/20-05/19/20    Authorization - Visit Number 1    Authorization - Number of Visits 10    Progress Note Due on Visit 10    OT Start Time 1233    OT Stop Time 1320    OT Time Calculation (min) 47 min    Activity Tolerance Patient limited by lethargy    Behavior During Therapy Restless;Impulsive;Flat affect   repetitive behaviors          Past Medical History:  Diagnosis Date  . Anxiety   . Erythema   . History of Clostridium difficile    dx 02-07-2015--  resolved  . History of septic shock   . Hyperlipidemia   . Hypertension    CURRENTLY NOT TAKING MED SECONDARY TO HYPOTENTION FROM SEPSIS--  MONITORED BY PCP  . Ischemic finger    erythemic of fingers secondary to septic shock 12-17-2014--  HEALING  . 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 Keratosis seborrheica   . Left ureteral stone   . Mild dementia ([place])   . Parkinson disease [facility])     Past Surgical History:  Procedure Laterality Date  . APPENDECTOMY    . BREAST EXCISIONAL BIOPSY Right    [facility]  . BREAST LUMPECTOMY WITH RADIOACTIVE SEED LOCALIZATION Right 08/28/2014   Procedure: RIGHT BREAST LUMPECTOMY WITH RADIOACTIVE SEED LOCALIZATION;  Surgeon: [name], MD;  Location: [facility];  Service: General;  Laterality: Right;  . CESAREAN SECTION  x2  . COLONOSCOPY    . CYSTOSCOPY W/  URETERAL STENT PLACEMENT Left 02/28/2015   Procedure: CYSTOSCOPY WITH STENT REPLACEMENT;  Surgeon: [name], MD;  Location: [facility];  Service: Urology;  Laterality: Left;  . CYSTOSCOPY WITH RETROGRADE PYELOGRAM, URETEROSCOPY AND STENT PLACEMENT Left 02/07/2015   Procedure: CYSTOSCOPY WITH LEFT RETROGRADE PYELOGRAM, URETEROSCOPY AND STENT PLACEMENT. REMOVAL OF UROSTOMY TUBE;  Surgeon: [name], MD;  Location: [facility];  Service: Urology;  Laterality: Left;  . CYSTOSCOPY/RETROGRADE/URETEROSCOPY/STONE EXTRACTION WITH BASKET Left 02/28/2015   Procedure: CYSTOSCOPY/RETROGRADE/URETEROSCOPY/STONE EXTRACTION WITH BASKET;  Surgeon: [name], MD;  Location: [facility];  Service: Urology;  Laterality: Left;  . HOLMIUM LASER APPLICATION Left 0/03/3817   Procedure: WITH HOLMIUM LASER ;  Surgeon: [name], MD;  Location: [facility];  Service: Urology;  Laterality: Left;  . NEGATIVE SLEEP STUDY  2013  . OVARIAN CYST REMOVAL  age 89    There were no vitals filed for this visit.   Subjective Assessment - 02/19/20 1237    Subjective  "I like to read the paper"    Patient is accompanied by: Family member   husband (who provided history)   Pertinent History Parkinson's disease, lewy body dementia.  PMH:  anxiety, hyperlipidemia, HTN, vision loss L eye due to central retinal vein occlusion with macular edema, hallucinations/delusions, hx of multiple  falls, orthostatic hypotension    Limitations dementia, impulsive, L visual loss, hallucinations/delusions, fall risk    Patient Stated Goals activities to maintain physical abilities    Currently in Pain? No/denies   husband reports intermittent R hand pain (has had multiple falls), but difficult to determine.  will monitor as relates to treatment            [facility] OT Assessment - 02/19/20 0001      Assessment   Medical Diagnosis parkinson's disease, LBD    Referring Provider (OT) Bing Matter, PA-C    Onset Date/Surgical Date 01/21/20   MD order   Hand Dominance Right      Precautions   Precautions Fall    Precaution Comments Vision loss L eye; hallucinations and delusions, dementia, impulsive      Balance Screen   Has the patient fallen in the past 6 months Yes    How many times? see PT eval      Home  Environment   Family/patient expects to be discharged to: Private residence    Lives With Spouse   1 dog      Prior Function   Level of Independence Needs assistance with ADLs;Needs assistance with gait;Needs assistance with transfers;Other (comment)   needs assist for ADLs, dependent for IADLs   Leisure Prior to Covid, participated in [employer], spin class, and yoga, has dog that she shows affection to, used to color and complete puzzles   enjoys hiking, music (used to like reading and watching tv)    Comments currently going to personal trainer for 70min 1x/wk      ADL   Eating/Feeding --   min A/verbal and tactile cueing  (sports cup, scoops)   Grooming --   can comb sometimes, husband blows drys/styles   Upper Body Bathing Maximal assistance    Lower Body Bathing Maximal assistance    Upper Body Dressing Moderate assistance   difficulty with orientation/cues   Lower Body Dressing Moderate assistance   orientation/cues   Toilet Transfer Supervision/safety    Toileting - Clothing Manipulation Minimal assistance   inconsistent   Toileting -  Hygiene Supervision/safety   confusion going to restroom location, wears depends    Where Assessed - Toileting hygiene --   incontinent, wears depends   Tub/Shower Transfer Supervision/safety    ADL comments Has Video cameras/alarm/alerts due to wondering, (has inground pool and garage gym with mat), fenced in yard.  Is starting to wonder (inside and outside 1x).    Pt is not performing IADLs.  Pt will do a specific task but not always successful. (perseverates, confusion, impulsive per husband)     Going to get a  monitor through Doctor, hospital Status History of falls   supervision-min A depending on cognition   Mobility Status Comments decr R arm swing and finger flex hand posture      Written Expression   Dominant Hand Right    Handwriting Not legible   only able to form single letters then circles, curved lines   Written Experience --   unable to generate written words due to cognition     Vision - History   Baseline Vision Wears glasses only for reading    Visual History --   central retinal vein occlusion with vision loss L eye   Additional Comments able to read 1 word large print correctly x1      Cognition   Overall  Cognitive Status --   impaired, fluctuates, has worsened since Covid   Attention Focused;Sustained    Focused Attention Impaired   closes eyes at times   Sustained Attention Impaired    Memory Impaired    Memory Impairment Decreased long term memory;Decreased short term memory;Decreased recall of new information;Storage deficit;Retrieval deficit    Decreased Long Term Memory --   does not always know husband   Awareness Impaired    Awareness Impairment Intellectual impairment    Problem Solving Impaired    Behaviors Restless;Impulsive;Confabulation;Perseveration   repetitive behaviors, stood up impulsively   Cognition Comments able to follow simple 1-step directions inconsistently with visual and verbal cueing, needs tactile cueing at times      Posture/Postural Control   Posture/Postural Control Postural limitations    Postural Limitations Rounded Shoulders;Posterior pelvic tilt;Flexed trunk    Posture Comments leans forward when sitting with head close to table at times      Coordination   Gross Motor Movements are Fluid and Coordinated --   inconsistent   Fine Motor Movements are Fluid and Coordinated No    Coordination Able to pick up and manipulate coins in R hand, able to clap with BUEs, able to stack cones with mod cueing.  Coordination  affected by vision and cognition.      ROM / Strength   AROM / PROM / Strength AROM      AROM   Overall AROM  Within functional limits for tasks performed    Overall AROM Comments BUEs except mild decr full elbow ext with overhead reach and mind decr R full R shoulder flex (likely due to bradykinesia)      Strength   Overall Strength Unable to assess   due to cognition     Hand Function   Right Hand Gross Grasp Functional    Left Hand Gross Grasp Functional                           OT Education - 02/19/20 2046    Education Details Recommended that husband give pt simple, basic piece of functional task while he is completing task (give pt washcloths to fold while he's doing laundry, give her pen/paper while he is making grocery list, stir while he's making dinner, wipe table while he is cleaning, etc) to incr particiaption, physical and cognitive engagement (recommended husband to focus on accuracy).    Person(s) Educated Patient;Spouse    Methods Explanation    Comprehension Verbalized understanding               OT Long Term Goals - 02/19/20 2044      OT LONG TERM GOAL #1   Title Pt/husband will be independent with HEP for functional activities for incr cognitive and physical engagement.--check STGs 03/27/20    Time 5    Period Weeks    Status New      OT LONG TERM GOAL #2   Title Pt/husband will verbalize strategies to incr safety and participation in ADLs and simple IADLs.    Time 5    Period Weeks    Status New                 Plan - 02/19/20 2027    Clinical Impression Statement Pt is a 69 y.o. female with Parkinson's disease with lewy body dementia.  PMH also includes:Parkinson's disease, lewy body dementia.  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  Husband reports that pt was having cognitive  difficulties but was able to participate in multiple group exercise classes prior to Covid pandemic, but can not participate now due to cognitive decline and husband desires to incr pt participation in functional activity and keep pt at home as long as possible.  Pt presents today with bradykinesia, significant cognitive deficits, decr balance and functional mobility, decr posture, and decr participation in ADLs/IADLs, and coordination/UE functional use.  Pt would benefit from occupational therapy to establish HEP, caregiver education, decr risk of future complication/incr safety as able, and incr participation in functional activities.    OT Occupational Profile and History Detailed Assessment- Review of Records and additional review of physical, cognitive, psychosocial history related to current functional performance    Occupational performance deficits (Please refer to evaluation for details): ADL's;IADL's;Leisure;Social Participation    Body Structure / Function / Physical Skills ADL;Improper spinal/pelvic alignment;Mobility;Balance;Tone;UE functional use;Coordination;GMC;IADL;Decreased knowledge of use of DME;Decreased knowledge of precautions;Vision    Cognitive Skills Attention;Learn;Memory;Orientation;Problem Solve;Safety Awareness;Thought;Understand;Sequencing    Rehab Potential Fair    Clinical Decision Making Multiple treatment options, significant modification of task necessary    Comorbidities Affecting Occupational Performance: Presence of comorbidities impacting occupational performance    Comorbidities impacting occupational performance description: Lewy body dementia, vision loss L eye, hallucinations/delusions    Modification or Assistance to Complete Evaluation  Min-Moderate modification of tasks or assist with assess necessary to complete eval    OT Frequency 1x / week    OT Duration 4 weeks   +eval; however, may d/c sooner depending on participation   OT Treatment/Interventions  Self-care/ADL training;Aquatic Therapy;Therapeutic exercise;Functional Mobility Training;Manual Therapy;Neuromuscular education;Therapeutic activities;Cognitive remediation/compensation;DME and/or AE instruction;Visual/perceptual remediation/compensation;Patient/family education;Moist Heat;Passive range of motion    Plan initate HEP for functional activities, strategies for incr participation in ADLs/IADLs    Consulted and Agree with Plan of Care Patient;Family member/caregiver    Family Member Consulted husband           Patient will benefit from skilled therapeutic intervention in order to improve the following deficits and impairments:   Body Structure / Function / Physical Skills: ADL, Improper spinal/pelvic alignment, Mobility, Balance, Tone, UE functional use, Coordination, GMC, IADL, Decreased knowledge of use of DME, Decreased knowledge of precautions, Vision Cognitive Skills: Attention, Learn, Memory, Orientation, Problem Solve, Safety Awareness, Thought, Understand, Sequencing     Visit Diagnosis: Frontal lobe and executive function deficit  Attention and concentration deficit  Unsteadiness on feet  Other symptoms and signs involving the nervous system  Other symptoms and signs involving the musculoskeletal system  Abnormal posture  Other lack of coordination    Problem List Patient Active Problem List   Diagnosis Date Noted  . 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[place]) 09/06/2017  . Syncope 06/13/2017  . 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 Age-related nuclear cataract of both eyes 09/16/2016  . Presbyopia of both eyes 09/16/2016  . Diarrhea of presumed infectious origin 09/12/2015  . Hyperlipidemia 09/12/2015  . Sepsis ([place]) 09/12/2015  . Anxiety 02/04/2015  . Injury of finger, superficial, infected 02/04/2015  . Injury of foot, left 02/04/2015  . Keratosis, seborrheic 02/04/2015  . Memory loss 02/04/2015  . Neoplasm of uncertain behavior of skin of face 02/04/2015  .  Tremor 02/04/2015  . Undiagnosed cardiac murmurs 02/04/2015  . Fracture of fifth toe, left, closed 02/03/2015  . Ischemia of toes bilaterally 01/27/2015  . Ischemia of fingers--bilaterally 01/27/2015  . Hypokalemia  01/24/2015  . Edema--BLE 01/24/2015  . Leucocytosis 01/24/2015  . Bacteremia due to Escherichia coli 01/24/2015  . Debility 01/16/2015  . Acute renal failure syndrome ([place])   . E. coli UTI   . Leukocytosis   . 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 Thrombocytopenia ([place])   . Metabolic encephalopathy   . Acute respiratory failure with hypoxia ([place])   . Parkinson's disease ([place])   . Altered mental status   . Encounter for central line placement   . Pulmonary infiltrates   . Abdominal pain, lower   . Hypoxia   . Pyelonephritis   . Pyonephrosis   . Septic shock ([place]) 01/09/2015  . Essential hypertension 10/04/2014  . Abnormal mammogram of right breast 08/28/2014  . Mild cognitive impairment 05/23/2014  . Parkinsonian features 05/23/2014  . 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 [place], [place], [address] Phone: [phone]   Fax:  [phone]  Name: Becky Rogers MRN: [id number] Date of Birth: [birth year]   Vianne Bulls, OTR/L [facility] [address] [place], [place]  [address] [phone] phone [phone] 02/19/20 8:50 PM

## 2020-02-19 NOTE — Addendum Note (Signed)
Addended by: Vianne Bulls D on: 02/19/2020 08:52 PM   Modules accepted: Orders

## 2020-02-22 ENCOUNTER — Other Ambulatory Visit: Payer: Self-pay

## 2020-02-22 ENCOUNTER — Ambulatory Visit: Payer: Medicare Other | Admitting: Physical Therapy

## 2020-02-22 DIAGNOSIS — R2681 Unsteadiness on feet: Secondary | ICD-10-CM

## 2020-02-22 DIAGNOSIS — R293 Abnormal posture: Secondary | ICD-10-CM

## 2020-02-22 DIAGNOSIS — M6281 Muscle weakness (generalized): Secondary | ICD-10-CM

## 2020-02-22 DIAGNOSIS — R41844 Frontal lobe and executive function deficit: Secondary | ICD-10-CM | POA: Diagnosis not present

## 2020-02-22 NOTE — Patient Instructions (Signed)
Husband takes pictures on phone of the following for additions to HEP:  1)Use of [employer] on large therapy ball-incorporating BUE reaching, upright posture, and tapping/sequencing at the ball as well as squats and widened BOS weigthshfiting  2)Use of aerobic machines (like Nustep/seated stepper) that patient may be able to use at [place] with trainer  3)Use of Zoom ball for postural strengthening, aerobic activity and balance

## 2020-02-22 NOTE — Therapy (Signed)
[facility] [address] [place], [place], [address] Phone: [phone]   Fax:  [phone]  Physical Therapy Treatment  Patient Details  Name: Becky Rogers MRN: [id number] Date of Birth: July 26, 1950 Referring Provider (PT): Bing Matter, [place]   Encounter Date: 02/22/2020   PT End of Session - 02/22/20 1944    Visit Number 2    Number of Visits 5    Date for PT Re-Evaluation 01/60/10   60 day cert for 5 wk POC   Authorization Type Medicare/AARP    PT Start Time 0935    PT Stop Time 1013    PT Time Calculation (min) 38 min    Equipment Utilized During Treatment Gait belt    Activity Tolerance Other (comment)   PT/husband help pt to stay engaged/redirect to activity at hand   Behavior During Therapy [facility] for tasks assessed/performed   writhing/dyskinesias          Past Medical History:  Diagnosis Date   Anxiety    Erythema    History of Clostridium difficile    dx 02-07-2015--  resolved   History of septic shock    Hyperlipidemia    Hypertension    CURRENTLY NOT TAKING MED SECONDARY TO HYPOTENTION FROM SEPSIS--  MONITORED BY PCP   Ischemic finger    erythemic of fingers secondary to septic shock 12-17-2014--  HEALING   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 ([place])    secondary to septic shock 12-17-2014--  healing   Keratosis seborrheica    Left ureteral stone    Mild dementia ([place])    Parkinson disease ([place])     Past Surgical History:  Procedure Laterality Date   APPENDECTOMY     BREAST EXCISIONAL BIOPSY Right    CSL   BREAST LUMPECTOMY WITH RADIOACTIVE SEED LOCALIZATION Right 08/28/2014   Procedure: RIGHT BREAST LUMPECTOMY WITH RADIOACTIVE SEED LOCALIZATION;  Surgeon: [name], MD;  Location: [place];  Service: General;  Laterality: Right;   CESAREAN SECTION  x2   COLONOSCOPY     CYSTOSCOPY W/ URETERAL STENT PLACEMENT Left 02/28/2015   Procedure: CYSTOSCOPY  WITH STENT REPLACEMENT;  Surgeon: [name], MD;  Location: [facility];  Service: Urology;  Laterality: Left;   CYSTOSCOPY WITH RETROGRADE PYELOGRAM, URETEROSCOPY AND STENT PLACEMENT Left 02/07/2015   Procedure: CYSTOSCOPY WITH LEFT RETROGRADE PYELOGRAM, URETEROSCOPY AND STENT PLACEMENT. REMOVAL OF UROSTOMY TUBE;  Surgeon: [name], MD;  Location: [facility];  Service: Urology;  Laterality: Left;   CYSTOSCOPY/RETROGRADE/URETEROSCOPY/STONE EXTRACTION WITH BASKET Left 02/28/2015   Procedure: CYSTOSCOPY/RETROGRADE/URETEROSCOPY/STONE EXTRACTION WITH BASKET;  Surgeon: [name], MD;  Location: [facility];  Service: Urology;  Laterality: Left;   HOLMIUM LASER APPLICATION Left 9/32/3557   Procedure: WITH HOLMIUM LASER ;  Surgeon: [name], MD;  Location: [facility];  Service: Urology;  Laterality: Left;   NEGATIVE SLEEP STUDY  2013   OVARIAN CYST REMOVAL  age 29    There were no vitals filed for this visit.   Subjective Assessment - 02/22/20 0938    Subjective Been working with trainer once a week.  No changes, no falls    Patient is accompained by: Family member   Husband   Patient Stated Goals Goals are to help get her engaged to get back physical strength. (per husband)    Currently in Pain? No/denies  Therapeutic Activities: 1)  With patient facing green therapy ball in chair-using Boomwhackers (with PT in front of pt to verbalize and demonstrate)-husband provides supervision and assist to redirect  -BUE tap to ball, then raise overhead, 10-15 reps (initally slow, then speed up to rhythm)  -BUE tap to outside of ball, then open arms wide for best posture, 10-15 reps (husband provides assist for wider open arms and slowed pace)  -BUE tap to top of ball with squat, then raise overhead, x 10 reps (cues and assist for widened BOS)  2)  Standing at counter with UE support:  Marching in place x 10  reps (PT provides hand cues for high step march)  3) Standing near counter-side step R and L, x 10 reps each direction, then pt goes to rock and lift, x 3 reps each leg  4) Initially sitting, then stand, using Zoom ball, therapist assisting pt and husband on other end of zoom ball, PT assists with timing, coordination, and open arms/upright posture, 3 short bouts of 2-3 reps, then worked up to 8-10 reps more continuous with less stops between.        [facility] Adult PT Treatment/Exercise - 02/22/20 0001      Ambulation/Gait   Ambulation/Gait Yes    Ambulation/Gait Assistance 4: Min guard    Ambulation Distance (Feet) 300 Feet    Assistive device None    Gait Pattern Step-through pattern;Decreased arm swing - right;Decreased arm swing - left;Scissoring;Narrow base of support;Trunk flexed    Ambulation Surface Level;Indoor    Gait Comments PT provides verbal cues and tactile cues for arm swing (PT on R side and taps RUE for cues for arm swing).  PT maintains min guard throughout, and has to physically guide patient to turn R and L in gym area.      Exercises   Exercises Knee/Hip      Knee/Hip Exercises: Aerobic   Nustep NuStep, Level 2, 4 extremities x 8 minutes.  Initially, pt starts at 40 steps/minute and with cues/encouragement, she is able to get up to (and at times > than) 80 steps/minute                  PT Education - 02/22/20 1943    Education Details Activities to incorporate into/add to current routine with husband/trainer    Person(s) Educated Patient;Spouse    Methods Explanation;Demonstration   Husband takes pictures on phone   Comprehension Verbalized understanding   Husband verbalizes understanding              PT Long Term Goals - 02/07/20 2004      PT LONG TERM GOAL #1   Title Pt/husband will demonstrate understanding of HEP to address balance, strengthening, and gait for improved mobility.  TARGET 4 weeks, 03/07/2020    Time 5    Period Weeks     Status New      PT LONG TERM GOAL #2   Title Pt will improve gait distance in 3 minute walk test to at least 580 ft, demonstrating improved gait efficiency and endurance.    Time 5    Period Weeks    Status New      PT LONG TERM GOAL #3   Title Pt will improve TUG score to less than or equal to 13.5 sec, to demonstrate decreased fall risk.    Time 5    Period Weeks    Status New      PT LONG TERM GOAL #4  Title Pt will perform at least 15 minutes of consecutive aerobic activity, to demonstrate improved endurance for potential return to community fitness.    Time 5    Period Weeks    Status New                 Plan - 02/22/20 1945    Clinical Impression Statement PT session today focused on activities to incorporate into pt's current routine to address posture, lower extremity and postural strength, balance.  Pt remains engaged for most of the session, with occasional redirection within tasks to ensure proper technique and safety.  Husband is present and verbalizes understanding of activities to add to her current routine.  Educated in ways to make sure pt is safe while performing these activities.  She is able to complete 8 minutes on NuStep for aerobic activity at beginning of session, keeping steps/minute at moderate intensity with cueing throughout.    Personal Factors and Comorbidities Comorbidity 3+;Behavior Pattern    Comorbidities See PMH/problem list    Examination-Activity Limitations Locomotion Level;Reach Overhead;Stand;Stairs;Transfers    Examination-Participation Restrictions Community Activity;Other   Community fitness   Stability/Clinical Decision Making Evolving/Moderate complexity    Rehab Potential Fair   due to cognition; has good family support of husband   PT Frequency 1x / week    PT Duration 4 weeks   plus eval   PT Treatment/Interventions ADLs/Self Care Home Management;Neuromuscular re-education;Balance training;Therapeutic exercise;Therapeutic  activities;Functional mobility training;Gait training;Patient/family education    PT Next Visit Plan Review what they have been doing for HEP (given this visit plus sit<>stand wtih ball raise overhead/ball toss/catch); continued functional activities for strength and balance (?step ups)    Consulted and Agree with Plan of Care Patient;Family member/caregiver    Family Member Consulted Husband           Patient will benefit from skilled therapeutic intervention in order to improve the following deficits and impairments:  Abnormal gait, Difficulty walking, Decreased endurance, Decreased safety awareness, Decreased balance, Decreased mobility, Decreased strength, Postural dysfunction  Visit Diagnosis: Unsteadiness on feet  Abnormal posture  Muscle weakness (generalized)     Problem List Patient Active Problem List   Diagnosis Date Noted   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[place]) 09/06/2017   Syncope 06/13/2017   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   Age-related nuclear cataract of both eyes 09/16/2016   Presbyopia of both eyes 09/16/2016   Diarrhea of presumed infectious origin 09/12/2015   Hyperlipidemia 09/12/2015   Sepsis ([place]) 09/12/2015   Anxiety 02/04/2015   Injury of finger, superficial, infected 02/04/2015   Injury of foot, left 02/04/2015   Keratosis, seborrheic 02/04/2015   Memory loss 02/04/2015   Neoplasm of uncertain behavior of skin of face 02/04/2015   Tremor 02/04/2015   Undiagnosed cardiac murmurs 02/04/2015   Fracture of fifth toe, left, closed 02/03/2015   Ischemia of toes bilaterally 01/27/2015   Ischemia of fingers--bilaterally 01/27/2015   Hypokalemia 01/24/2015   Edema--BLE 01/24/2015   Leucocytosis 01/24/2015   Bacteremia due to Escherichia coli 01/24/2015   Debility 01/16/2015   Acute renal failure syndrome (HCC)    E. 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 02/22/2020, 7:50 PM  Frazier Butt., PT   [facility] [address] [place], [place], [address] Phone: [phone]   Fax:  [phone]  Name: AMITA ATAYDE MRN: [id number] Date of Birth: [birth year]

## 2020-02-29 ENCOUNTER — Other Ambulatory Visit: Payer: Self-pay

## 2020-02-29 ENCOUNTER — Ambulatory Visit: Payer: Medicare Other | Admitting: Physical Therapy

## 2020-02-29 DIAGNOSIS — M6281 Muscle weakness (generalized): Secondary | ICD-10-CM

## 2020-02-29 DIAGNOSIS — R41844 Frontal lobe and executive function deficit: Secondary | ICD-10-CM | POA: Diagnosis not present

## 2020-02-29 DIAGNOSIS — R293 Abnormal posture: Secondary | ICD-10-CM

## 2020-02-29 DIAGNOSIS — R2681 Unsteadiness on feet: Secondary | ICD-10-CM

## 2020-02-29 NOTE — Therapy (Signed)
[facility] [address] [place], [place], [address] Phone: [phone]   Fax:  [phone]  Physical Therapy Treatment  Patient Details  Name: Becky Rogers MRN: [id number] Date of Birth: March 12, 1951 Referring Provider (PT): Bing Matter, [place]   Encounter Date: 02/29/2020   PT End of Session - 02/29/20 1811    Visit Number 3    Number of Visits 5    Date for PT Re-Evaluation 30/16/01   60 day cert for 5 wk POC   Authorization Type Medicare/AARP    PT Start Time 1017    PT Stop Time 1059    PT Time Calculation (min) 42 min    Equipment Utilized During Treatment Gait belt    Activity Tolerance Other (comment)   PT/husband help pt to stay engaged/redirect to activity at hand   Behavior During Therapy [facility] for tasks assessed/performed   writhing/dyskinesias          Past Medical History:  Diagnosis Date   Anxiety    Erythema    History of Clostridium difficile    dx 02-07-2015--  resolved   History of septic shock    Hyperlipidemia    Hypertension    CURRENTLY NOT TAKING MED SECONDARY TO HYPOTENTION FROM SEPSIS--  MONITORED BY PCP   Ischemic finger    erythemic of fingers secondary to septic shock 12-17-2014--  HEALING   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 ([place])    secondary to septic shock 12-17-2014--  healing   Keratosis seborrheica    Left ureteral stone    Mild dementia ([place])    Parkinson disease ([place])     Past Surgical History:  Procedure Laterality Date   APPENDECTOMY     BREAST EXCISIONAL BIOPSY Right    CSL   BREAST LUMPECTOMY WITH RADIOACTIVE SEED LOCALIZATION Right 08/28/2014   Procedure: RIGHT BREAST LUMPECTOMY WITH RADIOACTIVE SEED LOCALIZATION;  Surgeon: [name], MD;  Location: [place] [facility];  Service: General;  Laterality: Right;   CESAREAN SECTION  x2   COLONOSCOPY     CYSTOSCOPY W/ URETERAL STENT PLACEMENT Left 02/28/2015   Procedure: CYSTOSCOPY  WITH STENT REPLACEMENT;  Surgeon: [name], MD;  Location: [facility];  Service: Urology;  Laterality: Left;   CYSTOSCOPY WITH RETROGRADE PYELOGRAM, URETEROSCOPY AND STENT PLACEMENT Left 02/07/2015   Procedure: CYSTOSCOPY WITH LEFT RETROGRADE PYELOGRAM, URETEROSCOPY AND STENT PLACEMENT. REMOVAL OF UROSTOMY TUBE;  Surgeon: [name], MD;  Location[facility];  Service: Urology;  Laterality: Left;   CYSTOSCOPY/RETROGRADE/URETEROSCOPY/STONE EXTRACTION WITH BASKET Left 02/28/2015   Procedure: CYSTOSCOPY/RETROGRADE/URETEROSCOPY/STONE EXTRACTION WITH BASKET;  Surgeon: [name], MD;  Location: [facility];  Service: Urology;  Laterality: Left;   HOLMIUM LASER APPLICATION Left 0/93/2355   Procedure: WITH HOLMIUM LASER ;  Surgeon: [name], MD;  Location: [facility];  Service: Urology;  Laterality: Left;   NEGATIVE SLEEP STUDY  2013   OVARIAN CYST REMOVAL  age 69    There were no vitals filed for this visit.   Subjective Assessment - 02/29/20 1020    Subjective No changes, no falls.  Used the lifting on the ball and some stretch bands, and she was a little more engaged than she has been over this past week.  Haven't been to trainer, as she has been out of town.    Patient is accompained by: Family member   Husband   Patient Stated Goals Goals are to help get her engaged to  get back physical strength. (per husband)    Currently in Pain? No/denies                             [facility] Adult PT Treatment/Exercise - 02/29/20 0001      Knee/Hip Exercises: Aerobic   Nustep NuStep, Level 2>4, 4 extremities x 8 minutes.  Pt able to maintain >70-80 step/min.  >600 steps throughout             Therapeutic Activities: 1)  With patient facing green therapy ball in chair-using Boomwhackers (with PT in front of pt to verbalize and demonstrate)-husband provides supervision and assist to redirect             -BUE tap to ball,  then raise overhead, 10-15 reps    -alternating tapping to ball, with big effort lifting shoulders (husband assists with lifting shoulders; PT cues to assist with gentle lifting)  2)  With pt holding pool noodle, and PT assisting pt for diagonal reaches up and out, then down and in (cone target on ground), x 10 reps each side  3)  With patient facing aerobic step (6"), PT assists pt with step up/up, down/down from step, x 10 reps, at least, each leg  (cues for upright posture upon standing on step)  4)  One foot propped on step with overhead lift red star ball, x 10 reps each foot position.  Therapist provides min assist for posture and positioning.  5)  At end of session, pt ambulates 230 ft, carrying red star ball overhead (for bouts of 10-20 ft at a time), cues for lifting ball overhead to carry  PT provides close min guard/min assist and manual/demo/verbal cues throughout and husband available to assist.  Extra time needed for exercises, as pt is distracted at times.          PT Long Term Goals - 02/07/20 2004      PT LONG TERM GOAL #1   Title Pt/husband will demonstrate understanding of HEP to address balance, strengthening, and gait for improved mobility.  TARGET 4 weeks, 03/07/2020    Time 5    Period Weeks    Status New      PT LONG TERM GOAL #2   Title Pt will improve gait distance in 3 minute walk test to at least 580 ft, demonstrating improved gait efficiency and endurance.    Time 5    Period Weeks    Status New      PT LONG TERM GOAL #3   Title Pt will improve TUG score to less than or equal to 13.5 sec, to demonstrate decreased fall risk.    Time 5    Period Weeks    Status New      PT LONG TERM GOAL #4   Title Pt will perform at least 15 minutes of consecutive aerobic activity, to demonstrate improved endurance for potential return to community fitness.    Time 5    Period Weeks    Status New                 Plan - 02/29/20 1812    Clinical  Impression Statement Verbally reviewed with husband activities provided as HEP last visit.  He feels she has been more engaged in physical activities in their home gym since last visit.  Pt able to increase her resistance on the NuStep today and maintains near 80 steps/minute throughout with very  little cueing.  She needs hand over hand assist for other activities in gym today, but is able to perform step ups and single limb stance/postural exercises with assist of therapist and husband.    Personal Factors and Comorbidities Comorbidity 3+;Behavior Pattern    Comorbidities See PMH/problem list    Examination-Activity Limitations Locomotion Level;Reach Overhead;Stand;Stairs;Transfers    Examination-Participation Restrictions Community Activity;Other   Community fitness   Stability/Clinical Decision Making Evolving/Moderate complexity    Rehab Potential Fair   due to cognition; has good family support of husband   PT Frequency 1x / week    PT Duration 4 weeks   plus eval   PT Treatment/Interventions ADLs/Self Care Home Management;Neuromuscular re-education;Balance training;Therapeutic exercise;Therapeutic activities;Functional mobility training;Gait training;Patient/family education    PT Next Visit Plan Continue functional activities for strength and endurance (squats to pick up objects (?bean bag toss/cones); step ups, step taps/marching, SLS activities; forward/back/side stepping activities, adding to HEP as able    Consulted and Agree with Plan of Care Patient;Family member/caregiver    Family Member Consulted Husband           Patient will benefit from skilled therapeutic intervention in order to improve the following deficits and impairments:  Abnormal gait, Difficulty walking, Decreased endurance, Decreased safety awareness, Decreased balance, Decreased mobility, Decreased strength, Postural dysfunction  Visit Diagnosis: Unsteadiness on feet  Abnormal posture  Muscle weakness  (generalized)     Problem List Patient Active Problem List   Diagnosis Date Noted   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   Syncope 06/13/2017   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   Age-related nuclear cataract of both eyes 09/16/2016   Presbyopia of both eyes 09/16/2016   Diarrhea of presumed infectious origin 09/12/2015   Hyperlipidemia 09/12/2015   Sepsis ([place]) 09/12/2015   Anxiety 02/04/2015   Injury of finger, superficial, infected 02/04/2015   Injury of foot, left 02/04/2015   Keratosis, seborrheic 02/04/2015   Memory loss 02/04/2015   Neoplasm of uncertain behavior of skin of face 02/04/2015   Tremor 02/04/2015   Undiagnosed cardiac murmurs 02/04/2015   Fracture of fifth toe, left, closed 02/03/2015   Ischemia of toes bilaterally 01/27/2015   Ischemia of fingers--bilaterally 01/27/2015   Hypokalemia 01/24/2015   Edema--BLE 01/24/2015   Leucocytosis 01/24/2015   Bacteremia due to Escherichia coli 01/24/2015   Debility 01/16/2015   Acute renal failure syndrome (HCC)    E. 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 02/29/2020, 6:22 PM Frazier Butt., PT  [facility] [address] [place], [place], [address] Phone: [phone]   Fax:  [phone]  Name: Becky Rogers MRN: [id number] Date  of Birth: Jun 25, 1951

## 2020-03-06 ENCOUNTER — Ambulatory Visit: Payer: Medicare Other | Admitting: Speech Pathology

## 2020-03-06 ENCOUNTER — Other Ambulatory Visit: Payer: Self-pay

## 2020-03-06 DIAGNOSIS — R41844 Frontal lobe and executive function deficit: Secondary | ICD-10-CM | POA: Diagnosis not present

## 2020-03-06 DIAGNOSIS — R41841 Cognitive communication deficit: Secondary | ICD-10-CM

## 2020-03-06 NOTE — Patient Instructions (Signed)
Website: [email or web address] Sign in: LindaY  Password: therapy   General swallowing tips  -Monitor for "pocketing" food or pills -Monitor for "oral holding" (holding foods or liquid in the mouth without swallowing" -Allow/assist Becky Rogers with feeding herself as much as possible for as long as possible. -Tactile cues can be helpful (dry spoon, gently stroke throat or cheek) if you notice she is holding food. - Finger foods if she has trouble using utensils Signs of Aspiration Pneumonia   . Chest pain/tightness . Fever (can be low grade) . Cough  o With foul-smelling phlegm (sputum) o With sputum containing pus or blood o With greenish sputum . Fatigue  . Shortness of breath  . Wheezing   **IF YOU HAVE THESE SIGNS, CONTACT YOUR DOCTOR OR GO TO THE EMERGENCY DEPARTMENT OR URGENT CARE AS SOON AS POSSIBLE**

## 2020-03-06 NOTE — Therapy (Signed)
[facility] [address] [place], [place], [address] Phone: [phone]   Fax:  [phone]  Speech Language Pathology Treatment  Patient Details  Name: Becky Rogers MRN: [id number] Date of Birth: [birth year] Referring Provider (SLP): Bing Matter, [place]   Encounter Date: 03/06/2020   End of Session - 03/06/20 1300    Visit Number 2    Number of Visits 3    Date for SLP Re-Evaluation 04/06/20    Authorization Type Medicare and AARP supplement    SLP Start Time 4132    SLP Stop Time  1230    SLP Time Calculation (min) 45 min    Activity Tolerance Patient tolerated treatment well           Past Medical History:  Diagnosis Date  . Anxiety   . Erythema   . History of Clostridium difficile    dx 02-07-2015--  resolved  . History of septic shock   . Hyperlipidemia   . Hypertension    CURRENTLY NOT TAKING MED SECONDARY TO HYPOTENTION FROM SEPSIS--  MONITORED BY PCP  . Ischemic finger    erythemic of fingers secondary to septic shock 12-17-2014--  HEALING  . 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 Keratosis seborrheica   . Left ureteral stone   . Mild dementia ([place])   . Parkinson disease [facility])     Past Surgical History:  Procedure Laterality Date  . APPENDECTOMY    . BREAST EXCISIONAL BIOPSY Right    CSL  . BREAST LUMPECTOMY WITH RADIOACTIVE SEED LOCALIZATION Right 08/28/2014   Procedure: RIGHT BREAST LUMPECTOMY WITH RADIOACTIVE SEED LOCALIZATION;  Surgeon: [name], MD;  Location: [facility];  Service: General;  Laterality: Right;  . CESAREAN SECTION  x2  . COLONOSCOPY    . CYSTOSCOPY W/ URETERAL STENT PLACEMENT Left 02/28/2015   Procedure: CYSTOSCOPY WITH STENT REPLACEMENT;  Surgeon: [name], MD;  Location: [facility];  Service: Urology;  Laterality: Left;  . CYSTOSCOPY WITH RETROGRADE PYELOGRAM,  URETEROSCOPY AND STENT PLACEMENT Left 02/07/2015   Procedure: CYSTOSCOPY WITH LEFT RETROGRADE PYELOGRAM, URETEROSCOPY AND STENT PLACEMENT. REMOVAL OF UROSTOMY TUBE;  Surgeon: [name], MD;  Location: [facility];  Service: Urology;  Laterality: Left;  . CYSTOSCOPY/RETROGRADE/URETEROSCOPY/STONE EXTRACTION WITH BASKET Left 02/28/2015   Procedure: CYSTOSCOPY/RETROGRADE/URETEROSCOPY/STONE EXTRACTION WITH BASKET;  Surgeon: [name], MD;  Location: [facility];  Service: Urology;  Laterality: Left;  . HOLMIUM LASER APPLICATION Left 4/40/1027   Procedure: WITH HOLMIUM LASER ;  Surgeon: [name], MD;  Location: [facility];  Service: Urology;  Laterality: Left;  . NEGATIVE SLEEP STUDY  2013  . OVARIAN CYST REMOVAL  age 69    There were no vitals filed for this visit.   Subjective Assessment - 03/06/20 1251    Subjective Spouse guided pt to ST room    Patient is accompained by: Family member    Currently in Pain? No/denies                 ADULT SLP TREATMENT - 03/06/20 1251      General Information   Behavior/Cognition Alert;Distractible;Requires cueing;Decreased sustained attention      Treatment Provided   Treatment provided Cognitive-Linquistic;Dysphagia      Dysphagia Treatment   Temperature Spikes Noted No    Patient observed directly with PO's No    Other treatment/comments Spouse mentioned neurology visit  yesterday; MD mentioned possibility of dysphagia with typical disease progression. SLP educated pt, spouse on signs of dysphagia to monitor, typical presentations of dysphagia with dementia and strategies to maximize safety and independence. Educated on signs of aspiration PNA; handout provided.      Pain Assessment   Pain Assessment No/denies pain      Cognitive-Linquistic Treatment   Treatment focused on Cognition;Patient/family/caregiver education    Skilled Treatment SLP provided education on cognitive activities for home and  trialed simple activities in [place] with pt (level 1: naming, picture recall, following commands). Pt required usual mod cues for sustained attention, however was able to complete tasks ~80% accuracy. SLP set up account for pt and husband to assist pt with some of these tasks at home.       Assessment / Recommendations / Plan   Plan Continue with current plan of care      Progression Toward Goals   Progression toward goals Progressing toward goals            SLP Education - 03/06/20 1300    Education Details signs of dysphagia and aspiration PNA, cognitive activities for home    Person(s) Educated Patient;Spouse    Methods Explanation;Demonstration;Handout    Comprehension Verbalized understanding;Need further instruction              SLP Long Term Goals - 03/06/20 1301      SLP LONG TERM GOAL #1   Title Pt/husband will verbalize knowledge of appropriate cognitive-linguistic activitities for pt at home.    Time 2    Period Weeks    Status On-going      SLP LONG TERM GOAL #2   Title Pt/husband will demonstrate understanding of signs of dysphagia and aspiration PNA.    Time 2    Period Weeks    Status Achieved            Plan - 03/06/20 1301    Clinical Impression Statement Adaly Puder presents with severe cognitive communication impairment characterized by decreased focused/sustained attention, disinhibition (wandering in room, required redirection from fidgeting with SLP's computer and electrical outlets), impaired memory, and therefore higher-level cognitive processes are also impaired. Vocal intensity is reduced at average of low 60s dB, range 56-71 dB. She has difficulty following single-step commands. Husband denies pt has any signs of swallowing difficulty, although she did have decreased PO intake, weight loss which has since improved, stabalized. Given severity of deficits, as well as difficulty maintaining attention for simple tasks, SLP educated spouse  that outpatient SLP services not likely to provide much functional benefit for patient. She has done home health in the past and SLP feels this may be most appropriate service for patient, functionally. I recommend brief course of skilled ST focused on caregiver education re: home management of behaviors, signs of swallowing impairment which may occur with progressing dementia, and appropriate cognitive-linguistic activities for patient in the home.    Speech Therapy Frequency 1x /week    Duration 2 weeks    Treatment/Interventions Aspiration precaution training;SLP instruction and feedback;Cueing hierarchy;Patient/family education;Internal/external aids;Compensatory strategies;Functional tasks    Potential to Achieve Goals Fair    Potential Considerations Medical prognosis;Severity of impairments;Previous level of function;Cooperation/participation level;Ability to learn/carryover information    Consulted and Agree with Plan of Care Patient           Patient will benefit from skilled therapeutic intervention in order to improve the following deficits and impairments:   Cognitive communication deficit  Problem List Patient Active Problem List   Diagnosis Date Noted  . 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[place]) 09/06/2017  . Syncope 06/13/2017  . 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 Age-related nuclear cataract of both eyes 09/16/2016  . Presbyopia of both eyes 09/16/2016  . Diarrhea of presumed infectious origin 09/12/2015  . Hyperlipidemia 09/12/2015  . Sepsis ([place]) 09/12/2015  . Anxiety 02/04/2015  . Injury of finger, superficial, infected 02/04/2015  . Injury of foot, left 02/04/2015  . Keratosis, seborrheic 02/04/2015  . Memory loss 02/04/2015  . Neoplasm of uncertain behavior of skin of face 02/04/2015  . Tremor 02/04/2015  . Undiagnosed cardiac murmurs 02/04/2015  . Fracture of fifth toe, left, closed 02/03/2015  . Ischemia of toes bilaterally 01/27/2015  . Ischemia of  fingers--bilaterally 01/27/2015  . Hypokalemia 01/24/2015  . Edema--BLE 01/24/2015  . Leucocytosis 01/24/2015  . Bacteremia due to Escherichia coli 01/24/2015  . Debility 01/16/2015  . Acute renal failure syndrome ([place])   . E. coli UTI   . Leukocytosis   . 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 Thrombocytopenia ([place])   . Metabolic encephalopathy   . Acute respiratory failure with hypoxia ([place])   . Parkinson's disease ([place])   . Altered mental status   . Encounter for central line placement   . Pulmonary infiltrates   . Abdominal pain, lower   . Hypoxia   . Pyelonephritis   . Pyonephrosis   . Septic shock ([place]) 01/09/2015  . Essential hypertension 10/04/2014  . Abnormal mammogram of right breast 08/28/2014  . Mild cognitive impairment 05/23/2014  . Parkinsonian features 05/23/2014  . 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

## 2020-03-07 ENCOUNTER — Ambulatory Visit: Payer: Medicare Other | Admitting: Physical Therapy

## 2020-03-07 DIAGNOSIS — R293 Abnormal posture: Secondary | ICD-10-CM

## 2020-03-07 DIAGNOSIS — R29818 Other symptoms and signs involving the nervous system: Secondary | ICD-10-CM

## 2020-03-07 DIAGNOSIS — R2681 Unsteadiness on feet: Secondary | ICD-10-CM

## 2020-03-07 DIAGNOSIS — R41844 Frontal lobe and executive function deficit: Secondary | ICD-10-CM | POA: Diagnosis not present

## 2020-03-07 DIAGNOSIS — M6281 Muscle weakness (generalized): Secondary | ICD-10-CM

## 2020-03-07 NOTE — Therapy (Signed)
[facility] [address] [place], [place], [address] Phone: [phone]   Fax:  [phone]  Physical Therapy Treatment/Discharge Summary  Patient Details  Name: Becky Rogers MRN: [id number] Date of Birth: 13-Nov-1950 Referring Provider (PT): Bing Matter, [place]   Encounter Date: 03/07/2020   PT End of Session - 03/07/20 1206    Visit Number 4    Number of Visits 5    Date for PT Re-Evaluation 75/91/63   60 day cert for 5 wk POC   Authorization Type Medicare/AARP    PT Start Time 1020    PT Stop Time 1102    PT Time Calculation (min) 42 min    Equipment Utilized During Treatment Gait belt    Activity Tolerance Other (comment)   PT/husband help pt to stay engaged/redirect to activity at hand   Behavior During Therapy [facility] for tasks assessed/performed   writhing/dyskinesias          Past Medical History:  Diagnosis Date  . Anxiety   . Erythema   . History of Clostridium difficile    dx 02-07-2015--  resolved  . History of septic shock   . Hyperlipidemia   . Hypertension    CURRENTLY NOT TAKING MED SECONDARY TO HYPOTENTION FROM SEPSIS--  MONITORED BY PCP  . Ischemic finger    erythemic of fingers secondary to septic shock 12-17-2014--  HEALING  . 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 Keratosis seborrheica   . Left ureteral stone   . Mild dementia ([place])   . Parkinson disease [facility])     Past Surgical History:  Procedure Laterality Date  . APPENDECTOMY    . BREAST EXCISIONAL BIOPSY Right    CSL  . BREAST LUMPECTOMY WITH RADIOACTIVE SEED LOCALIZATION Right 08/28/2014   Procedure: RIGHT BREAST LUMPECTOMY WITH RADIOACTIVE SEED LOCALIZATION;  Surgeon: [name], MD;  Location: [facility];  Service: General;  Laterality: Right;  . CESAREAN SECTION  x2  . COLONOSCOPY    . CYSTOSCOPY W/ URETERAL STENT PLACEMENT Left 02/28/2015    Procedure: CYSTOSCOPY WITH STENT REPLACEMENT;  Surgeon: [name], MD;  Location: [facility];  Service: Urology;  Laterality: Left;  . CYSTOSCOPY WITH RETROGRADE PYELOGRAM, URETEROSCOPY AND STENT PLACEMENT Left 02/07/2015   Procedure: CYSTOSCOPY WITH LEFT RETROGRADE PYELOGRAM, URETEROSCOPY AND STENT PLACEMENT. REMOVAL OF UROSTOMY TUBE;  Surgeon: [name], MD;  [facility];  Service: Urology;  Laterality: Left;  . CYSTOSCOPY/RETROGRADE/URETEROSCOPY/STONE EXTRACTION WITH BASKET Left 02/28/2015   Procedure: CYSTOSCOPY/RETROGRADE/URETEROSCOPY/STONE EXTRACTION WITH BASKET;  Surgeon: [name], MD;  Location: [facility];  Service: Urology;  Laterality: Left;  . HOLMIUM LASER APPLICATION Left 8/46/6599   Procedure: WITH HOLMIUM LASER ;  Surgeon: [name], MD;  Location: [facility];  Service: Urology;  Laterality: Left;  . NEGATIVE SLEEP STUDY  2013  . OVARIAN CYST REMOVAL  age 23    There were no vitals filed for this visit.   Subjective Assessment - 03/07/20 1022    Subjective Husband reports that she is more focused at home.    Patient is accompained by: Family member   Husband   Patient Stated Goals Goals are to help get her engaged to get back physical strength. (per husband)    Currently in Pain? No/denies              [facility] PT Assessment - 03/07/20 1042  Timed Up and Go Test   TUG Comments deferred, pt unable to follow directions due to cognition                        [facility] Adult PT Treatment/Exercise - 03/07/20 1042      Ambulation/Gait   Ambulation/Gait Yes    Ambulation/Gait Assistance 4: Min guard;4: Min assist    Ambulation/Gait Assistance Details clinic distances plus 3 MWT, needs ocassional assist to prevent from bumping into objects    Assistive device None    Gait Pattern Step-through pattern;Decreased arm swing - right;Decreased arm swing - left;Scissoring;Narrow base of  support;Trunk flexed    Ambulation Surface Level;Indoor    Gait Comments 530' in 3MWT      Therapeutic Activites    Therapeutic Activities Other Therapeutic Activities    Other Therapeutic Activities discussed POC with pt's husband and how today is pt's last scheduled visit (but POC written for one more visit), husband stating that today can be pt's last visit and pt can be discharged from PT due to pt having exercises that she can do at home in her home gym and pt's husband feels as if her cognition is her main barrier vs. pt's strength/balance. discussed need of a new referral if pt returns to therapy, pt's husband verbalized understanding      Knee/Hip Exercises: Aerobic   Nustep NuStep, Level 2>4, 4 extremities x 7 minutes.  Pt able to maintain >70-80 step/min.  >600 steps throughout            Therapeutic Activities: 1)  With patient facing green therapy ball in chair-using Boomwhackers (with PT in front of pt to verbalize and demonstrate)-husband provides supervision and assist to redirect             -BUE tap to ball, then raise overhead, 10 reps              -BUE tap to outside of ball, then open arms wide for best posture, 10 reps(husband provides assist for wider open arms and slowed pace)   2) Sit <> stands with ball raise overhead in standing for posture, use of demo and manual cues to perform x10 reps   3) With use of boomwhackers: whacking forward in front of body and then opening up wide x15 reps, then performing whacking boomwackers overhead and opening up arms overhead x10 reps - with husband help facilitating movement and therapist demonstrating in front of pt, esp for slowed movement   4) Tossing scarves for biggger movement and posture, intermittent reps, tossing to and from therapist and then pt's husband, pt easily distracted with activity   5) Functional squats picking up cones from floor x8 reps, min guard for balance, tried giving directions to pick up a different  colored cone from floor, but with pt having difficulties due to cognition and no vision in L eye, needed to move cones in pt's R visual field     PT provides close min guard/min assist and manual/demo/verbal cues throughout and husband available to assist.  Extra time needed for exercises, as pt is distracted at times.      PT Education - 03/07/20 1205    Education Details see TA    Person(s) Educated Patient;Spouse    Methods Explanation   discussed with husband   Comprehension Verbalized understanding   husband verbalizes understanding              PT Long Term Goals -  03/07/20 1207      PT LONG TERM GOAL #1   Title Pt/husband will demonstrate understanding of HEP to address balance, strengthening, and gait for improved mobility.  TARGET 4 weeks, 03/07/2020    Baseline husband reports pt performing HEP at home, did not review in session today.    Time 5    Period Weeks    Status Achieved      PT LONG TERM GOAL #2   Title Pt will improve gait distance in 3 minute walk test to at least 580 ft, demonstrating improved gait efficiency and endurance.    Baseline 530 ft on 03/07/20    Time 5    Period Weeks    Status Not Met      PT LONG TERM GOAL #3   Title Pt will improve TUG score to less than or equal to 13.5 sec, to demonstrate decreased fall risk.    Baseline unable to perform today - attempted multiple times, but pt unable to follow directions due to cognitive deficits    Time 5    Period Weeks    Status Deferred      PT LONG TERM GOAL #4   Title Pt will perform at least 15 minutes of consecutive aerobic activity, to demonstrate improved endurance for potential return to community fitness.    Time 5    Period Weeks    Status Achieved           PHYSICAL THERAPY DISCHARGE SUMMARY  Visits from Start of Care: 4  Current functional level related to goals / functional outcomes: See LTGs   Remaining deficits:  decreased functional lower extremity strength,  abnormal posture, decreased balance and decreased timing/coordination, impaired gait   Education / Equipment: HEP  Plan: Patient agrees to discharge.  Patient goals were partially met. Patient is being discharged due to the patient's request.  ?????           *pt's husbands request        Plan - 03/07/20 1226    Clinical Impression Statement discussed POC with pt's husband as this was pt's last visit scheduled and that pt would be discharged after PT today due to pt already having exercises that pt's husband can help and replicate at home in their home gym. Began to assess LTGs with pt achieving LTG #1 and #4. Unable to perform TUG today, attempted multiple tries, but with pt's cognitive deficits pt could not properly sequence task. Assessed 3MWT with pt ambulating 530' today (previously 528'). Remainder of session focused on functional BLE strengthening and exercises for posture. Pt needing incr time due to being easily distracted and husband very supportive and assisting pt with all exercises. Pt will be discharged from PT at this time. Husband in understanding of need of a new referral if pt needed to return to PT.    Personal Factors and Comorbidities Comorbidity 3+;Behavior Pattern    Comorbidities See PMH/problem list    Examination-Activity Limitations Locomotion Level;Reach Overhead;Stand;Stairs;Transfers    Examination-Participation Restrictions Community Activity;Other   Community fitness   Stability/Clinical Decision Making Evolving/Moderate complexity    Rehab Potential Fair   due to cognition; has good family support of husband   PT Frequency 1x / week    PT Duration 4 weeks   plus eval   PT Treatment/Interventions ADLs/Self Care Home Management;Neuromuscular re-education;Balance training;Therapeutic exercise;Therapeutic activities;Functional mobility training;Gait training;Patient/family education    PT Next Visit Plan D/C from PT    Consulted and Agree with  Plan of  Care Patient;Family member/caregiver    Family Member Consulted Husband           Patient will benefit from skilled therapeutic intervention in order to improve the following deficits and impairments:  Abnormal gait, Difficulty walking, Decreased endurance, Decreased safety awareness, Decreased balance, Decreased mobility, Decreased strength, Postural dysfunction  Visit Diagnosis: Unsteadiness on feet  Abnormal posture  Muscle weakness (generalized)  Other symptoms and signs involving the nervous system     Problem List Patient Active Problem List   Diagnosis Date Noted  . 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[place]) 09/06/2017  . Syncope 06/13/2017  . 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 Age-related nuclear cataract of both eyes 09/16/2016  . Presbyopia of both eyes 09/16/2016  . Diarrhea of presumed infectious origin 09/12/2015  . Hyperlipidemia 09/12/2015  . Sepsis ([place]) 09/12/2015  . Anxiety 02/04/2015  . Injury of finger, superficial, infected 02/04/2015  . Injury of foot, left 02/04/2015  . Keratosis, seborrheic 02/04/2015  . Memory loss 02/04/2015  . Neoplasm of uncertain behavior of skin of face 02/04/2015  . Tremor 02/04/2015  . Undiagnosed cardiac murmurs 02/04/2015  . Fracture of fifth toe, left, closed 02/03/2015  . Ischemia of toes bilaterally 01/27/2015  . Ischemia of fingers--bilaterally 01/27/2015  . Hypokalemia 01/24/2015  . Edema--BLE 01/24/2015  . Leucocytosis 01/24/2015  . Bacteremia due to Escherichia coli 01/24/2015  . Debility 01/16/2015  . Acute renal failure syndrome ([place])   . E. coli UTI   . Leukocytosis   . 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 Thrombocytopenia ([place])   . Metabolic encephalopathy   . Acute respiratory failure with hypoxia ([place])   . Parkinson's disease ([place])   . Altered mental status   . Encounter for central line placement   . Pulmonary infiltrates   . Abdominal pain, lower   . Hypoxia   . Pyelonephritis   .  Pyonephrosis   . Septic shock ([place]) 01/09/2015  . Essential hypertension 10/04/2014  . Abnormal mammogram of right breast 08/28/2014  . Mild cognitive impairment 05/23/2014  . Parkinsonian features 05/23/2014  . 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 [place], [place], [address] Phone: [phone]   Fax:  [phone]  Name: Becky Rogers MRN: [id number] Date of Birth: Nov 01, 1950

## 2020-03-13 ENCOUNTER — Ambulatory Visit: Payer: Medicare Other | Attending: Family Medicine | Admitting: Occupational Therapy

## 2020-03-13 DIAGNOSIS — R41841 Cognitive communication deficit: Secondary | ICD-10-CM | POA: Insufficient documentation

## 2020-03-13 DIAGNOSIS — R29898 Other symptoms and signs involving the musculoskeletal system: Secondary | ICD-10-CM | POA: Insufficient documentation

## 2020-03-13 DIAGNOSIS — R293 Abnormal posture: Secondary | ICD-10-CM | POA: Insufficient documentation

## 2020-03-13 DIAGNOSIS — R41840 Attention and concentration deficit: Secondary | ICD-10-CM | POA: Insufficient documentation

## 2020-03-13 DIAGNOSIS — R29818 Other symptoms and signs involving the nervous system: Secondary | ICD-10-CM | POA: Insufficient documentation

## 2020-03-13 DIAGNOSIS — R278 Other lack of coordination: Secondary | ICD-10-CM | POA: Insufficient documentation

## 2020-03-13 DIAGNOSIS — R41844 Frontal lobe and executive function deficit: Secondary | ICD-10-CM | POA: Insufficient documentation

## 2020-03-13 DIAGNOSIS — R2681 Unsteadiness on feet: Secondary | ICD-10-CM | POA: Insufficient documentation

## 2020-03-19 ENCOUNTER — Encounter: Payer: Medicare Other | Admitting: Occupational Therapy

## 2020-03-20 ENCOUNTER — Ambulatory Visit (INDEPENDENT_AMBULATORY_CARE_PROVIDER_SITE_OTHER): Payer: Medicare Other | Admitting: Neurology

## 2020-03-20 ENCOUNTER — Other Ambulatory Visit: Payer: Self-pay

## 2020-03-20 ENCOUNTER — Ambulatory Visit: Payer: Medicare Other | Admitting: Speech Pathology

## 2020-03-20 VITALS — Ht 61.0 in

## 2020-03-20 DIAGNOSIS — R41844 Frontal lobe and executive function deficit: Secondary | ICD-10-CM | POA: Diagnosis present

## 2020-03-20 DIAGNOSIS — R41841 Cognitive communication deficit: Secondary | ICD-10-CM

## 2020-03-20 DIAGNOSIS — R2681 Unsteadiness on feet: Secondary | ICD-10-CM | POA: Diagnosis present

## 2020-03-20 DIAGNOSIS — R29898 Other symptoms and signs involving the musculoskeletal system: Secondary | ICD-10-CM | POA: Diagnosis present

## 2020-03-20 DIAGNOSIS — R41840 Attention and concentration deficit: Secondary | ICD-10-CM | POA: Diagnosis present

## 2020-03-20 DIAGNOSIS — R293 Abnormal posture: Secondary | ICD-10-CM | POA: Diagnosis present

## 2020-03-20 DIAGNOSIS — I951 Orthostatic hypotension: Secondary | ICD-10-CM

## 2020-03-20 DIAGNOSIS — R278 Other lack of coordination: Secondary | ICD-10-CM | POA: Diagnosis present

## 2020-03-20 DIAGNOSIS — R29818 Other symptoms and signs involving the nervous system: Secondary | ICD-10-CM | POA: Diagnosis present

## 2020-03-20 NOTE — Therapy (Signed)
[facility] [address] [place], [place], [address] Phone: [phone]   Fax:  [phone]  Speech Language Pathology Treatment and Discharge Summary  Patient Details  Name: Becky Rogers MRN: [id number] Date of Birth: 28-Dec-1950 Referring Provider (SLP): Bing Matter, PA-C   Encounter Date: 03/20/2020   End of Session - 03/20/20 1741    Visit Number 3    Number of Visits 3    Date for SLP Re-Evaluation 04/06/20    Authorization Type Medicare and AARP supplement    SLP Start Time 1445    SLP Stop Time  1530    SLP Time Calculation (min) 45 min    Activity Tolerance Other (comment)   constant cues from SLP, spouse for engagement          Past Medical History:  Diagnosis Date  . Anxiety   . Erythema   . History of Clostridium difficile    dx 02-07-2015--  resolved  . History of septic shock   . Hyperlipidemia   . Hypertension    CURRENTLY NOT TAKING MED SECONDARY TO HYPOTENTION FROM SEPSIS--  MONITORED BY PCP  . Ischemic finger    erythemic of fingers secondary to septic shock 12-17-2014--  HEALING  . 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 ([place])    secondary to septic shock 12-17-2014--  healing  . Keratosis seborrheica   . Left ureteral stone   . Mild dementia ([place])   . Parkinson disease [facility])     Past Surgical History:  Procedure Laterality Date  . APPENDECTOMY    . BREAST EXCISIONAL BIOPSY Right    CSL  . BREAST LUMPECTOMY WITH RADIOACTIVE SEED LOCALIZATION Right 08/28/2014   Procedure: RIGHT BREAST LUMPECTOMY WITH RADIOACTIVE SEED LOCALIZATION;  Surgeon: [name], MD;  Location: [facility];  Service: General;  Laterality: Right;  . CESAREAN SECTION  x2  . COLONOSCOPY    . CYSTOSCOPY W/ URETERAL STENT PLACEMENT Left 02/28/2015   Procedure: CYSTOSCOPY WITH STENT REPLACEMENT;  Surgeon: [name], MD;  Location[facility];  Service: Urology;  Laterality: Left;   . CYSTOSCOPY WITH RETROGRADE PYELOGRAM, URETEROSCOPY AND STENT PLACEMENT Left 02/07/2015   Procedure: CYSTOSCOPY WITH LEFT RETROGRADE PYELOGRAM, URETEROSCOPY AND STENT PLACEMENT. REMOVAL OF UROSTOMY TUBE;  Surgeon: [name], MD;  Location: [facility];  Service: Urology;  Laterality: Left;  . CYSTOSCOPY/RETROGRADE/URETEROSCOPY/STONE EXTRACTION WITH BASKET Left 02/28/2015   Procedure: CYSTOSCOPY/RETROGRADE/URETEROSCOPY/STONE EXTRACTION WITH BASKET;  Surgeon: [name], MD;  Location[facility];  Service: Urology;  Laterality: Left;  . HOLMIUM LASER APPLICATION Left 4/50/3888   Procedure: WITH HOLMIUM LASER ;  Surgeon: [name], MD;  Location[facility];  Service: Urology;  Laterality: Left;  . NEGATIVE SLEEP STUDY  2013  . OVARIAN CYST REMOVAL  age 69    There were no vitals filed for this visit.   Subjective Assessment - 03/20/20 1737    Subjective Pt restless and figeting with table, objects in room    Patient is accompained by: --   husband, Danny   Currently in Pain? No/denies                 ADULT SLP TREATMENT - 03/20/20 1738      General Information   Behavior/Cognition Alert;Distractible;Requires cueing;Decreased sustained attention      Treatment Provided   Treatment provided Cognitive-Linquistic      Pain Assessment   Pain Assessment No/denies pain  Cognitive-Linquistic Treatment   Treatment focused on Cognition;Patient/family/caregiver education    Skilled Treatment Spouse has tried [employer] activities on desktop, which were not engaging for pt. Reports he is going to try and set up on tablet. SLP provided link with other apps for deficit areas for spouse to try with pt. Pt had been waiting in the lobby for some time prior to appointment; she was less attentive and more restless today. Was due for med at 3:00, as well (mid-session). Pt required usual max A for participation and redirection today. She was not as engaged  with tablet as previous session. SLP educated re: timing activities around pt's periods of best focus. Simple card sort/selection with max cues, participated ~50% of the time. Suggested also using pt's favorite music to help her stay engaged.      Assessment / Recommendations / Plan   Plan Continue with current plan of care      Progression Toward Goals   Progression toward goals Goals met, education completed, patient discharged from [facility] - 03/20/20 [place] #1   Title Pt/husband will verbalize knowledge of appropriate cognitive-linguistic activitities for pt at home.    Time 2    Period Weeks    Status Achieved      SLP LONG TERM GOAL #2   Title Pt/husband will demonstrate understanding of signs of dysphagia and aspiration PNA.    Time 2    Period Weeks    Status Achieved            Plan - 03/20/20 1742    Clinical Impression Statement Becky Rogers presents with severe cognitive communication impairment characterized by decreased focused/sustained attention, disinhibition (wandering in room, required redirection from fidgeting with SLP's computer and electrical outlets), impaired memory, and therefore higher-level cognitive processes are also impaired. Given severity of deficits, as well as difficulty maintaining attention for simple tasks, SLP educated spouse that outpatient SLP services not likely to provide much functional benefit for patient. She has done home health in the past and SLP feels this may be most appropriate service for patient, functionally. Caregiver education completed re: home management of behaviors, signs of swallowing impairment which may occur with progressing dementia, and appropriate cognitive-linguistic activities for patient in the home. Pt, spouse in agreement with d/c.    Speech Therapy Frequency 1x /week    Duration 2 weeks    Treatment/Interventions Aspiration precaution training;SLP  instruction and feedback;Cueing hierarchy;Patient/family education;Internal/external aids;Compensatory strategies;Functional tasks    Potential to Achieve Goals Fair    Potential Considerations Medical prognosis;Severity of impairments;Previous level of function;Cooperation/participation level;Ability to learn/carryover information    Consulted and Agree with Plan of Care Patient           Patient will benefit from skilled therapeutic intervention in order to improve the following deficits and impairments:   Cognitive communication deficit    Problem List Patient Active Problem List   Diagnosis Date Noted  . 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[place]) 09/06/2017  . Syncope 06/13/2017  . 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 Age-related nuclear cataract of both eyes 09/16/2016  . Presbyopia of both eyes 09/16/2016  . Diarrhea of presumed infectious origin 09/12/2015  . Hyperlipidemia 09/12/2015  . Sepsis ([place]) 09/12/2015  . Anxiety 02/04/2015  . Injury of finger, superficial, infected 02/04/2015  . Injury of foot, left 02/04/2015  . Keratosis, seborrheic 02/04/2015  .  Memory loss 02/04/2015  . Neoplasm of uncertain behavior of skin of face 02/04/2015  . Tremor 02/04/2015  . Undiagnosed cardiac murmurs 02/04/2015  . Fracture of fifth toe, left, closed 02/03/2015  . Ischemia of toes bilaterally 01/27/2015  . Ischemia of fingers--bilaterally 01/27/2015  . Hypokalemia 01/24/2015  . Edema--BLE 01/24/2015  . Leucocytosis 01/24/2015  . Bacteremia due to Escherichia coli 01/24/2015  . Debility 01/16/2015  . Acute renal failure syndrome ([place])   . E. coli UTI   . Leukocytosis   . 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 Thrombocytopenia ([place])   . Metabolic encephalopathy   . Acute respiratory failure with hypoxia ([place])   . Parkinson's disease ([place])   . Altered mental status   . Encounter for central line placement   . Pulmonary infiltrates   . Abdominal pain, lower   . Hypoxia   .  Pyelonephritis   . Pyonephrosis   . Septic shock ([place]) 01/09/2015  . Essential hypertension 10/04/2014  . Abnormal mammogram of right breast 08/28/2014  . Mild cognitive impairment 05/23/2014  . Parkinsonian features 05/23/2014  . 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  Patient goals were met. Patient is being discharged due to meeting the stated rehab goals.  ?????         Deneise Lever, [place], [place] Speech-Language Pathologist       Aliene Altes 03/20/2020, 5:44 PM  [facility] [address] [place], [place], [address] Phone: [phone]   Fax:  [phone]   Name: Becky Rogers MRN: [id number] Date of Birth: 1950-12-30

## 2020-04-01 ENCOUNTER — Other Ambulatory Visit: Payer: Self-pay

## 2020-04-01 ENCOUNTER — Ambulatory Visit: Payer: Medicare Other | Admitting: Occupational Therapy

## 2020-04-01 ENCOUNTER — Encounter: Payer: Self-pay | Admitting: Occupational Therapy

## 2020-04-01 DIAGNOSIS — R41841 Cognitive communication deficit: Secondary | ICD-10-CM | POA: Diagnosis not present

## 2020-04-01 DIAGNOSIS — R293 Abnormal posture: Secondary | ICD-10-CM

## 2020-04-01 DIAGNOSIS — R29898 Other symptoms and signs involving the musculoskeletal system: Secondary | ICD-10-CM

## 2020-04-01 DIAGNOSIS — R29818 Other symptoms and signs involving the nervous system: Secondary | ICD-10-CM

## 2020-04-01 DIAGNOSIS — R278 Other lack of coordination: Secondary | ICD-10-CM

## 2020-04-01 DIAGNOSIS — R41844 Frontal lobe and executive function deficit: Secondary | ICD-10-CM

## 2020-04-01 DIAGNOSIS — R2681 Unsteadiness on feet: Secondary | ICD-10-CM

## 2020-04-01 DIAGNOSIS — R41840 Attention and concentration deficit: Secondary | ICD-10-CM

## 2020-04-01 NOTE — Therapy (Signed)
[facility] [address] [place], [place], [address] Phone: [phone]   Fax:  [phone]  Occupational Therapy Treatment  Patient Details  Name: Becky Rogers MRN: [id number] Date of Birth: 03-Aug-1950 Referring Provider (OT): Bing Matter, [place]   Encounter Date: 04/01/2020   OT End of Session - 04/01/20 1326    Visit Number 2    Number of Visits 5    Date for OT Re-Evaluation 03/27/20    Authorization Type Medicare & AARP supplement covered 100%    Authorization Time Period cert. date 02/19/20-05/19/20    Authorization - Visit Number 2    Authorization - Number of Visits 10    Progress Note Due on Visit 10    OT Start Time 6073    OT Stop Time 7106   pt had to go to the restroom during session   OT Time Calculation (min) 63 min    Activity Tolerance Patient tolerated treatment well    Behavior During Therapy Restless;Impulsive;Flat affect   repetitive behaviors          Past Medical History:  Diagnosis Date  . Anxiety   . Erythema   . History of Clostridium difficile    dx 02-07-2015--  resolved  . History of septic shock   . Hyperlipidemia   . Hypertension    CURRENTLY NOT TAKING MED SECONDARY TO HYPOTENTION FROM SEPSIS--  MONITORED BY PCP  . Ischemic finger    erythemic of fingers secondary to septic shock 12-17-2014--  HEALING  . 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 ([place])    secondary to septic shock 12-17-2014--  healing  . Keratosis seborrheica   . Left ureteral stone   . Mild dementia ([place])   . Parkinson disease [facility])     Past Surgical History:  Procedure Laterality Date  . APPENDECTOMY    . BREAST EXCISIONAL BIOPSY Right    CSL  . BREAST LUMPECTOMY WITH RADIOACTIVE SEED LOCALIZATION Right 08/28/2014   Procedure: RIGHT BREAST LUMPECTOMY WITH RADIOACTIVE SEED LOCALIZATION;  Surgeon: [name], MD;  Location: [facility];  Service: General;  Laterality: Right;  . CESAREAN  SECTION  x2  . COLONOSCOPY    . CYSTOSCOPY W/ URETERAL STENT PLACEMENT Left 02/28/2015   Procedure: CYSTOSCOPY WITH STENT REPLACEMENT;  Surgeon: [name], MD;  Location: [facility];  Service: Urology;  Laterality: Left;  . CYSTOSCOPY WITH RETROGRADE PYELOGRAM, URETEROSCOPY AND STENT PLACEMENT Left 02/07/2015   Procedure: CYSTOSCOPY WITH LEFT RETROGRADE PYELOGRAM, URETEROSCOPY AND STENT PLACEMENT. REMOVAL OF UROSTOMY TUBE;  Surgeon: [name], MD;  [facility];  Service: Urology;  Laterality: Left;  . CYSTOSCOPY/RETROGRADE/URETEROSCOPY/STONE EXTRACTION WITH BASKET Left 02/28/2015   Procedure: CYSTOSCOPY/RETROGRADE/URETEROSCOPY/STONE EXTRACTION WITH BASKET;  Surgeon: [name], MD;  Location[facility];  Service: Urology;  Laterality: Left;  . HOLMIUM LASER APPLICATION Left 2/69/4854   Procedure: WITH HOLMIUM LASER ;  Surgeon: [name], MD;  Location: [facility];  Service: Urology;  Laterality: Left;  . NEGATIVE SLEEP STUDY  2013  . OVARIAN CYST REMOVAL  age 71    There were no vitals filed for this visit.   Subjective Assessment - 04/01/20 1324    Subjective  fall last Saturday and hit her head (backwards)--did not lose consciousness    Patient is accompanied by: Family member    Pertinent History Parkinson's disease, lewy body dementia.  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 No/denies            Wiping table with min tactile cueing/chaining for initiation on movement and supervision for balance to continue.  Folded pillow case with mod difficulty and perseveration (recommended wash cloths at home).   Removing/replacing lid on bottle with min prompts.  Attempted sorting silverware, but pt unable and demo perseverative behaviors.  Pouring water from 1 cup to the other with mod cues/facilitation to initiate.  Pt demo perseverative behaviors throughout session and needed redirection throughout session.  Pt began sliding down in chair multiple times and needed cueing multiple times to sit upright in chair, but able to self correct with cueing.  Discussed/educated pt/caregiver on giving tactile cueing and initiating movement/chaining (using large movements when possible/safe (put shirt over head, hand over hand, etc).  Also recommended removing clutter and items that she is not using when possible to minimize distractions/improve attention to tasks.  Recommended that pt do similar/relevant portion of task that husband is performing (fold wash cloths while husband is doing laundry, tearing lettuce while husband is cooking, wipe table while husband is cleaning, washing 2 spoons while husband is loading dishwasher, etc to help with physical and cognitive engagement.  Also recommended/discussed use of busy board type activities, picking up various objects and putting in containers for coordination, simple dance videos or doing activity/exercise to music.  Also discussed what activities that pt current is participating in and provided feedback for incr safety and encouragement prn.  Discussed/recommended relevant, meaningful, functional activities in typical environment/context are typically more motivating and increases attention/participation vs. More abstract exercise and it pt will not participate in "gym-type activities," focus more on functional activities that day.  Also discussed expectations and focus more on participation vs. Outcome.  Husband verbalized understanding.     OT Education - 04/01/20 1448    Education Details Functional tasks and strategies to incr safety/ADL and IADL participation and  cueing for incr cognitive/physical engagement.    Person(s) Educated Patient;Spouse    Methods Explanation;Demonstration;Handout    Comprehension Verbalized understanding;Verbal cues required;Tactile cues required;Returned demonstration               OT Long Term Goals - 04/01/20 1448      OT LONG TERM GOAL #1   Title Pt/husband will be independent with HEP for functional activities for incr cognitive and physical engagement.--check STGs 03/27/20    Time 5    Period Weeks    Status Achieved      OT LONG TERM GOAL #2   Title Pt/husband will verbalize strategies to incr safety and participation in ADLs and simple IADLs.    Time 5    Period Weeks    Status Achieved                 Plan - 04/01/20 1450    Clinical Impression Statement Pt/husband verbalized understanding of strategies and activities for incr cognitive/physical engagement and ADL/IADL participation.  Due to severe cognitive deficits and perseverative behaviors, caregiver education primarily performed.  Pt/caregiver in agreement to d/c OT at this time.    OT Occupational Profile and History Detailed Assessment- Review of Records and additional review of physical, cognitive, psychosocial history related to current functional performance    Occupational  performance deficits (Please refer to evaluation for details): ADL's;IADL's;Leisure;Social Participation    Body Structure / Function / Physical Skills ADL;Improper spinal/pelvic alignment;Mobility;Balance;Tone;UE functional use;Coordination;GMC;IADL;Decreased knowledge of use of DME;Decreased knowledge of precautions;Vision    Cognitive Skills Attention;Learn;Memory;Orientation;Problem Solve;Safety Awareness;Thought;Understand;Sequencing    Rehab Potential Fair    Clinical Decision Making Multiple treatment options, significant modification of task necessary    Comorbidities Affecting Occupational Performance: Presence of comorbidities impacting occupational  performance    Comorbidities impacting occupational performance description: Lewy body dementia, vision loss L eye, hallucinations/delusions    Modification or Assistance to Complete Evaluation  Min-Moderate modification of tasks or assist with assess necessary to complete eval    OT Frequency 1x / week    OT Duration 4 weeks   +eval; however, may d/c sooner depending on participation   OT Treatment/Interventions Self-care/ADL training;Aquatic Therapy;Therapeutic exercise;Functional Mobility Training;Manual Therapy;Neuromuscular education;Therapeutic activities;Cognitive remediation/compensation;DME and/or AE instruction;Visual/perceptual remediation/compensation;Patient/family education;Moist Heat;Passive range of motion    Plan d/c OT    Consulted and Agree with Plan of Care Patient;Family member/caregiver    Family Member Consulted husband           Patient will benefit from skilled therapeutic intervention in order to improve the following deficits and impairments:   Body Structure / Function / Physical Skills: ADL, Improper spinal/pelvic alignment, Mobility, Balance, Tone, UE functional use, Coordination, GMC, IADL, Decreased knowledge of use of DME, Decreased knowledge of precautions, Vision Cognitive Skills: Attention, Learn, Memory, Orientation, Problem Solve, Safety Awareness, Thought, Understand, Sequencing     Visit Diagnosis: Unsteadiness on feet  Abnormal posture  Other symptoms and signs involving the nervous system  Frontal lobe and executive function deficit  Attention and concentration deficit  Other symptoms and signs involving the musculoskeletal system  Other lack of coordination    Problem List Patient Active Problem List   Diagnosis Date Noted  . 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[place]) 09/06/2017  . Syncope 06/13/2017  . 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 Age-related nuclear cataract of both eyes 09/16/2016  . Presbyopia of both eyes 09/16/2016  . Diarrhea  of presumed infectious origin 09/12/2015  . Hyperlipidemia 09/12/2015  . Sepsis ([place]) 09/12/2015  . Anxiety 02/04/2015  . Injury of finger, superficial, infected 02/04/2015  . Injury of foot, left 02/04/2015  . Keratosis, seborrheic 02/04/2015  . Memory loss 02/04/2015  . Neoplasm of uncertain behavior of skin of face 02/04/2015  . Tremor 02/04/2015  . Undiagnosed cardiac murmurs 02/04/2015  . Fracture of fifth toe, left, closed 02/03/2015  . Ischemia of toes bilaterally 01/27/2015  . Ischemia of fingers--bilaterally 01/27/2015  . Hypokalemia 01/24/2015  . Edema--BLE 01/24/2015  . Leucocytosis 01/24/2015  . Bacteremia due to Escherichia coli 01/24/2015  . Debility 01/16/2015  . Acute renal failure syndrome ([place])   . E. coli UTI   . Leukocytosis   . 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 Thrombocytopenia ([place])   . Metabolic encephalopathy   . Acute respiratory failure with hypoxia ([place])   . Parkinson's disease ([place])   . Altered mental status   . Encounter for central line placement   . Pulmonary infiltrates   . Abdominal pain, lower   . Hypoxia   . Pyelonephritis   . Pyonephrosis   . Septic shock ([place]) 01/09/2015  . Essential hypertension 10/04/2014  . Abnormal mammogram of right breast 08/28/2014  . Mild cognitive impairment 05/23/2014  . Parkinsonian features 05/23/2014  . 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  Pt also with bradykinesia, rigidity, abnormal posture, and decr balance.   Education / Equipment: Pt/husband educated in HEP/activities to incr cognitive/physical engagement and strategies to incr safety/participation in ADLs/IADLs.  Plan: Patient agrees to discharge.  Patient goals  were met. Patient is being discharged due to meeting the stated rehab goals.  And reaching maximal rehab potential at this time.?????        [facility] 04/01/2020, 3:18 PM  [facility] [address] [place], [place], [address] Phone: [phone]   Fax:  [phone]  Name: Becky Rogers MRN: [id number] Date of Birth: 25-Mar-1951   Vianne Bulls, OTR/L [facility] [address] [place], [place]  [address] [phone] phone [phone] 04/01/20 3:18 PM

## 2020-04-01 NOTE — Patient Instructions (Addendum)
     1.  Fold wash cloths, ball socks  2.  Wash 2 forks/spoons.  3.  Wipe table  4.  Give dog treats  5.  Transfer soil from 1 pot to the other  6.  Busy/Therapy boards (buttons, zippers)  7.  Pick up dried beans, cotton balls, coins, etc. And put in container  6.  Open bottles and close   7.  Squeeze sponge with bowl of water  8.  Put away silverware  9.  Tear lettuce, stir cold items, snap beans

## 2020-04-02 NOTE — Progress Notes (Signed)
cancelled

## 2020-12-29 ENCOUNTER — Other Ambulatory Visit: Payer: Self-pay | Admitting: Family Medicine

## 2020-12-29 DIAGNOSIS — Z1231 Encounter for screening mammogram for malignant neoplasm of breast: Secondary | ICD-10-CM

## 2020-12-31 ENCOUNTER — Other Ambulatory Visit: Payer: Self-pay

## 2020-12-31 ENCOUNTER — Ambulatory Visit
Admission: RE | Admit: 2020-12-31 | Discharge: 2020-12-31 | Disposition: A | Discharge status: Home / Self Care | Payer: Medicare Other | Source: Ambulatory Visit | Attending: Family Medicine | Admitting: Family Medicine

## 2020-12-31 DIAGNOSIS — Z1231 Encounter for screening mammogram for malignant neoplasm of breast: Secondary | ICD-10-CM

## 2021-07-12 DEATH — deceased

## 2022-02-04 ENCOUNTER — Other Ambulatory Visit: Payer: Self-pay | Admitting: Family Medicine

## 2022-02-04 DIAGNOSIS — Z1231 Encounter for screening mammogram for malignant neoplasm of breast: Secondary | ICD-10-CM

## 2022-02-16 ENCOUNTER — Encounter (HOSPITAL_BASED_OUTPATIENT_CLINIC_OR_DEPARTMENT_OTHER): Payer: Self-pay | Admitting: Emergency Medicine

## 2022-02-16 ENCOUNTER — Emergency Department (HOSPITAL_BASED_OUTPATIENT_CLINIC_OR_DEPARTMENT_OTHER): Payer: Medicare Other

## 2022-02-16 ENCOUNTER — Emergency Department (HOSPITAL_BASED_OUTPATIENT_CLINIC_OR_DEPARTMENT_OTHER)
Admission: EM | Admit: 2022-02-16 | Discharge: 2022-02-16 | Disposition: A | Discharge status: Home / Self Care | Payer: Medicare Other | Attending: Emergency Medicine | Admitting: Emergency Medicine

## 2022-02-16 ENCOUNTER — Other Ambulatory Visit: Payer: Self-pay

## 2022-02-16 DIAGNOSIS — S0990XA Unspecified injury of head, initial encounter: Secondary | ICD-10-CM | POA: Diagnosis present

## 2022-02-16 DIAGNOSIS — Z7982 Long term (current) use of aspirin: Secondary | ICD-10-CM | POA: Diagnosis not present

## 2022-02-16 DIAGNOSIS — S0083XA Contusion of other part of head, initial encounter: Secondary | ICD-10-CM | POA: Diagnosis not present

## 2022-02-16 DIAGNOSIS — W19XXXA Unspecified fall, initial encounter: Secondary | ICD-10-CM

## 2022-02-16 DIAGNOSIS — G3183 Dementia with Lewy bodies: Secondary | ICD-10-CM | POA: Diagnosis not present

## 2022-02-16 DIAGNOSIS — F0280 Dementia in other diseases classified elsewhere without behavioral disturbance: Secondary | ICD-10-CM

## 2022-02-16 DIAGNOSIS — W01198A Fall on same level from slipping, tripping and stumbling with subsequent striking against other object, initial encounter: Secondary | ICD-10-CM | POA: Insufficient documentation

## 2022-02-16 HISTORY — DX: Dementia in other diseases classified elsewhere, unspecified severity, without behavioral disturbance, psychotic disturbance, mood disturbance, and anxiety: F02.80

## 2022-02-16 NOTE — Discharge Instructions (Addendum)
CT head and face without any acute findings, which is reassuring.  Return for any new or worse symptoms.

## 2022-02-16 NOTE — ED Triage Notes (Signed)
Pt fell 4 days ago while at the drug store and hit face. Husband states it is not unusual for her to fall and she didn't seem to be any distress. Pt has a bump on left eye that has appeared that is concerning to husband. Pt normally walks and exercises at home. Communication is limited but since the fall pt has not been walking or communicating with husband.

## 2022-02-16 NOTE — ED Provider Notes (Signed)
[facility] Provider Note   CSN: [id number] Arrival date & time: 02/16/22  1451     History  Chief Complaint  Patient presents with   [name]    Becky Rogers is a 71 y.o. female.  Patient with end-stage Lewy bodies dementia.  First diagnosed in 2016.  Patient had a fall 4 days ago.  Patient does fall frequently.  This was not unusual.  But since the fall her communication has been limited and patient has not been walking.  So these were changes.  Patient did hit her head and she has some swelling around the left eye.  Patient not on blood thinners.  Patient followed by neurology at [facility] Medications Prior to Admission medications   Medication Sig Start Date End Date Taking? Authorizing Provider  aspirin EC 81 MG tablet Take 81 mg by mouth daily.    [provider]  carbidopa-levodopa (SINEMET IR) 25-100 MG per tablet Take 1.5 tablets by mouth See admin instructions. Take 1.5 tablets by mouth SIX times a day- 8 AM, 11 AM, 2 PM, 5 PM, 8 PM, and 11 PM (or bedtime)    [provider]  donepezil (ARICEPT) 5 MG tablet Take 10 mg by mouth at bedtime.  03/27/18   [provider]  phenazopyridine (PYRIDIUM) 200 MG tablet Take 1 tablet (200 mg total) by mouth 2 (two) times daily as needed (urinary discomfort). Patient not taking: Reported on 02/07/2020 07/13/16   Arlean Hopping C, PA-C  QUEtiapine (SEROQUEL) 100 MG tablet Take 100 mg by mouth at bedtime.    [provider]      Allergies    Patient has no known allergies.    Review of Systems   Review of Systems  Unable to perform ROS: Dementia  All other systems reviewed and are negative.   Physical Exam Updated Vital Signs BP 116/80 (BP Location: Right Arm)   Pulse 83   Temp 98 F (36.7 C)   Resp 20   Wt 39.5 kg   SpO2 97%   BMI 16.44 kg/m  Physical Exam Vitals and nursing note reviewed.  Constitutional:      General: She is not in acute  distress.    Appearance: Normal appearance. She is well-developed.  HENT:     Head: Normocephalic.     Comments: Patient with some swelling to the left cheek area.  Hematoma there measuring about 2 cm.  We will bit of swelling around the left eye. Eyes:     Extraocular Movements: Extraocular movements intact.     Conjunctiva/sclera: Conjunctivae normal.     Pupils: Pupils are equal, round, and reactive to light.  Cardiovascular:     Rate and Rhythm: Normal rate and regular rhythm.     Heart sounds: No murmur heard. Pulmonary:     Effort: Pulmonary effort is normal. No respiratory distress.     Breath sounds: Normal breath sounds.  Abdominal:     Palpations: Abdomen is soft.     Tenderness: There is no abdominal tenderness.  Musculoskeletal:        General: No swelling, deformity or signs of injury.     Cervical back: Normal range of motion and neck supple. No rigidity or tenderness.     Comments: No evidence of any deformity to upper extremities or lower extremities.  Patient moving lower extremities fairly spontaneously.  So I doubt that there is an occult hip fracture  there is no evidence of any deformities.  Skin:    General: Skin is warm and dry.     Capillary Refill: Capillary refill takes less than 2 seconds.  Neurological:     Mental Status: She is alert.     Comments: Patient moving right upper extremity right lower extremities.  Minimal movement of the left upper extremity and that is baseline for her.  Change in the fact that she is not communicating.  Way patient is moving her lower extremities.  No evidence of any fracture or deformity.  Psychiatric:        Mood and Affect: Mood normal.     ED Results / Procedures / Treatments   Labs (all labs ordered are listed, but only abnormal results are displayed) Labs Reviewed - No data to display  EKG None  Radiology CT Head Wo Contrast  Result Date: 02/16/2022 CLINICAL DATA:  Patient with fall 4 days ago with blunt  facial trauma. Patient has a bump on the left eye that has since appeared. History of Lewy body disease and Parkinson's disease. EXAM: CT HEAD WITHOUT CONTRAST CT MAXILLOFACIAL WITHOUT CONTRAST TECHNIQUE: Multidetector CT imaging of the head and maxillofacial structures were performed using the standard protocol without intravenous contrast. Multiplanar CT image reconstructions of the maxillofacial structures were also generated. RADIATION DOSE REDUCTION: This exam was performed according to the departmental dose-optimization program which includes automated exposure control, adjustment of the mA and/or kV according to patient size and/or use of iterative reconstruction technique. COMPARISON:  CT head 05/18/2017. FINDINGS: Of note, patient motion artifact greatly degrades image quality. Repeat images were obtained, though the superior most aspect of the head is not included on the field of view. CT HEAD FINDINGS Brain: No evidence of acute infarction, hemorrhage, hydrocephalus, extra-axial collection or mass lesion/mass effect. Generalized parenchymal volume loss with associated dilatation of the ventricles, more than expected for patient age. Vascular: Vascular calcifications at the skull base. No hyperdense vessel or unexpected calcification. Skull: Normal. Negative for fracture or focal lesion. Other: None. CT MAXILLOFACIAL FINDINGS Osseous: No fracture or mandibular dislocation. No destructive process. Orbits: Negative. No traumatic or inflammatory finding. Sinuses: Clear. Soft tissues: Negative. IMPRESSION: Patient motion artifact severely degrades image quality. Within these limitations, no acute intracranial abnormality, calvarial fracture, or maxillofacial fracture. No acute abnormality of the orbits. Electronically Signed   By: Ileana Roup M.D.   On: 02/16/2022 16:19   CT Maxillofacial WO CM  Result Date: 02/16/2022 CLINICAL DATA:  Patient with fall 4 days ago with blunt facial trauma. Patient has a  bump on the left eye that has since appeared. History of Lewy body disease and Parkinson's disease. EXAM: CT HEAD WITHOUT CONTRAST CT MAXILLOFACIAL WITHOUT CONTRAST TECHNIQUE: Multidetector CT imaging of the head and maxillofacial structures were performed using the standard protocol without intravenous contrast. Multiplanar CT image reconstructions of the maxillofacial structures were also generated. RADIATION DOSE REDUCTION: This exam was performed according to the departmental dose-optimization program which includes automated exposure control, adjustment of the mA and/or kV according to patient size and/or use of iterative reconstruction technique. COMPARISON:  CT head 05/18/2017. FINDINGS: Of note, patient motion artifact greatly degrades image quality. Repeat images were obtained, though the superior most aspect of the head is not included on the field of view. CT HEAD FINDINGS Brain: No evidence of acute infarction, hemorrhage, hydrocephalus, extra-axial collection or mass lesion/mass effect. Generalized parenchymal volume loss with associated dilatation of the ventricles, more than expected for patient age.  Vascular: Vascular calcifications at the skull base. No hyperdense vessel or unexpected calcification. Skull: Normal. Negative for fracture or focal lesion. Other: None. CT MAXILLOFACIAL FINDINGS Osseous: No fracture or mandibular dislocation. No destructive process. Orbits: Negative. No traumatic or inflammatory finding. Sinuses: Clear. Soft tissues: Negative. IMPRESSION: Patient motion artifact severely degrades image quality. Within these limitations, no acute intracranial abnormality, calvarial fracture, or maxillofacial fracture. No acute abnormality of the orbits. Electronically Signed   By: Ileana Roup M.D.   On: 02/16/2022 16:19    Procedures Procedures    Medications Ordered in ED Medications - No data to display  ED Course/ Medical Decision Making/ A&P                            Medical Decision Making Amount and/or Complexity of Data Reviewed Radiology: ordered.   Patient moving her neck fairly spontaneously.  Do not see a need for CT neck.  However her story is not inconsistent with a subdural hematoma.  Head CT however was negative.  Maxillofacial CT also negative for any bony injuries around that left eye.  Patient with end-stage dementia.  No explanation for her mental status changes at least from the injury.  Not all her husband wanted addressed.  And these changes have been present since that fall.  Patient's vital signs here no fevers.  Not tachycardic respiratory rates not up oxygen saturations 97%.  Blood pressure 116/80.   Final Clinical Impression(s) / ED Diagnoses Final diagnoses:  Fall, initial encounter  Contusion of face, initial encounter  Injury of head, initial encounter  Alzheimer disease [facility])    Rx / DC Orders ED Discharge Orders     None         Fredia Sorrow, MD 02/16/22 1702

## 2022-02-16 NOTE — ED Notes (Signed)
Patient transported to CT 

## 2022-02-16 NOTE — ED Provider Notes (Signed)
  [facility] DEPARTMENT Provider Note   CSN: [id number] Arrival date & time: 02/16/22  1451     History  Chief Complaint  Patient presents with   [name]    Becky Rogers is a 71 y.o. female.   Duplicate note see other complete note.       Home Medications Prior to Admission medications   Medication Sig Start Date End Date Taking? Authorizing Provider  aspirin EC 81 MG tablet Take 81 mg by mouth daily.    [provider]  carbidopa-levodopa (SINEMET IR) 25-100 MG per tablet Take 1.5 tablets by mouth See admin instructions. Take 1.5 tablets by mouth SIX times a day- 8 AM, 11 AM, 2 PM, 5 PM, 8 PM, and 11 PM (or bedtime)    [provider]  donepezil (ARICEPT) 5 MG tablet Take 10 mg by mouth at bedtime.  03/27/18   [provider]  phenazopyridine (PYRIDIUM) 200 MG tablet Take 1 tablet (200 mg total) by mouth 2 (two) times daily as needed (urinary discomfort). Patient not taking: Reported on 02/07/2020 07/13/16   Arlean Hopping C, PA-C  QUEtiapine (SEROQUEL) 100 MG tablet Take 100 mg by mouth at bedtime.    [provider]      Allergies    Patient has no known allergies.    Review of Systems   Review of Systems  Unable to perform ROS: Dementia    Physical Exam Updated Vital Signs BP (!) 157/103 Comment: not accurate because pt was moving due to parkinsons  Pulse 83   Temp 98 F (36.7 C)   Resp 20   Wt 39.5 kg   SpO2 97%   BMI 16.44 kg/m  Physical Exam  ED Results / Procedures / Treatments   Labs (all labs ordered are listed, but only abnormal results are displayed) Labs Reviewed - No data to display  EKG None  Radiology No results found.  Procedures Procedures    Medications Ordered in ED Medications - No data to display  ED Course/ Medical Decision Making/ A&P                           Medical Decision Making Amount and/or Complexity of Data Reviewed Radiology: ordered.    Final Clinical  Impression(s) / ED Diagnoses Final diagnoses:  Fall, initial encounter  Contusion of face, initial encounter  Injury of head, initial encounter  Alzheimer disease [facility])    Rx / DC Orders ED Discharge Orders     None         Fredia Sorrow, MD 02/19/22 1100

## 2022-02-18 ENCOUNTER — Ambulatory Visit
Admission: RE | Admit: 2022-02-18 | Discharge: 2022-02-18 | Disposition: A | Discharge status: Home / Self Care | Payer: Medicare Other | Source: Ambulatory Visit | Attending: Family Medicine | Admitting: Family Medicine

## 2022-02-18 DIAGNOSIS — Z1231 Encounter for screening mammogram for malignant neoplasm of breast: Secondary | ICD-10-CM

## 2022-07-12 DEATH — deceased
